# Patient Record
Sex: Female | Born: 1935 | Race: White | Hispanic: No | Smoking: Never smoker
Health system: Southern US, Community
[De-identification: ages and names within clinical notes are randomized; demographics above are authoritative.]

## PROBLEM LIST (undated history)

## (undated) DIAGNOSIS — F32A Depression, unspecified: Secondary | ICD-10-CM

## (undated) DIAGNOSIS — I10 Essential (primary) hypertension: Secondary | ICD-10-CM

## (undated) DIAGNOSIS — M810 Age-related osteoporosis without current pathological fracture: Secondary | ICD-10-CM

## (undated) DIAGNOSIS — E7800 Pure hypercholesterolemia, unspecified: Secondary | ICD-10-CM

## (undated) DIAGNOSIS — M1990 Unspecified osteoarthritis, unspecified site: Secondary | ICD-10-CM

## (undated) DIAGNOSIS — Z9109 Other allergy status, other than to drugs and biological substances: Secondary | ICD-10-CM

## (undated) DIAGNOSIS — N189 Chronic kidney disease, unspecified: Secondary | ICD-10-CM

## (undated) DIAGNOSIS — F329 Major depressive disorder, single episode, unspecified: Secondary | ICD-10-CM

## (undated) HISTORY — DX: Age-related osteoporosis without current pathological fracture: M81.0

## (undated) HISTORY — DX: Chronic kidney disease, unspecified: N18.9

## (undated) HISTORY — DX: Unspecified osteoarthritis, unspecified site: M19.90

## (undated) HISTORY — PX: ABDOMINAL HYSTERECTOMY: SHX81

---

## 2000-07-05 ENCOUNTER — Other Ambulatory Visit: Admission: RE | Admit: 2000-07-05 | Discharge: 2000-07-05 | Payer: Self-pay | Admitting: Gynecology

## 2003-06-19 ENCOUNTER — Other Ambulatory Visit: Admission: RE | Admit: 2003-06-19 | Discharge: 2003-06-19 | Payer: Self-pay | Admitting: Gynecology

## 2004-06-02 ENCOUNTER — Ambulatory Visit: Payer: Self-pay | Admitting: Family Medicine

## 2005-06-30 ENCOUNTER — Ambulatory Visit: Payer: Self-pay | Admitting: Family Medicine

## 2006-06-16 ENCOUNTER — Ambulatory Visit: Payer: Self-pay | Admitting: Ophthalmology

## 2006-06-22 ENCOUNTER — Ambulatory Visit: Payer: Self-pay | Admitting: Ophthalmology

## 2006-07-05 ENCOUNTER — Ambulatory Visit: Payer: Self-pay | Admitting: Family Medicine

## 2007-07-05 ENCOUNTER — Ambulatory Visit: Payer: Self-pay | Admitting: Family Medicine

## 2007-07-26 ENCOUNTER — Ambulatory Visit: Payer: Self-pay | Admitting: Gastroenterology

## 2007-07-26 LAB — HM COLONOSCOPY

## 2007-09-12 ENCOUNTER — Ambulatory Visit: Payer: Self-pay | Admitting: Family Medicine

## 2008-07-11 ENCOUNTER — Ambulatory Visit: Payer: Self-pay | Admitting: Family Medicine

## 2009-07-29 ENCOUNTER — Ambulatory Visit: Payer: Self-pay | Admitting: Family Medicine

## 2010-08-08 ENCOUNTER — Ambulatory Visit: Payer: Self-pay | Admitting: Family Medicine

## 2011-08-26 ENCOUNTER — Ambulatory Visit: Payer: Self-pay | Admitting: Family Medicine

## 2012-08-15 ENCOUNTER — Ambulatory Visit: Payer: Self-pay | Admitting: Family Medicine

## 2012-08-15 LAB — HM DEXA SCAN

## 2012-08-26 ENCOUNTER — Ambulatory Visit: Payer: Self-pay | Admitting: Family Medicine

## 2013-09-26 ENCOUNTER — Ambulatory Visit: Payer: Self-pay | Admitting: Family Medicine

## 2013-09-26 LAB — HM MAMMOGRAPHY

## 2014-05-03 ENCOUNTER — Ambulatory Visit: Payer: Self-pay | Admitting: Family Medicine

## 2014-09-11 ENCOUNTER — Other Ambulatory Visit: Payer: Self-pay | Admitting: Family Medicine

## 2014-09-11 DIAGNOSIS — Z1231 Encounter for screening mammogram for malignant neoplasm of breast: Secondary | ICD-10-CM

## 2014-09-11 LAB — CBC AND DIFFERENTIAL
HCT: 39 % (ref 36–46)
HEMOGLOBIN: 13.6 g/dL (ref 12.0–16.0)
Platelets: 242 10*3/uL (ref 150–399)
WBC: 5.4 10^3/mL

## 2014-09-11 LAB — LIPID PANEL
Cholesterol: 162 mg/dL (ref 0–200)
HDL: 50 mg/dL (ref 35–70)
LDL Cholesterol: 76 mg/dL
TRIGLYCERIDES: 178 mg/dL — AB (ref 40–160)

## 2014-09-11 LAB — TSH: TSH: 0.4 u[IU]/mL — AB (ref 0.41–5.90)

## 2014-09-11 LAB — BASIC METABOLIC PANEL
BUN: 21 mg/dL (ref 4–21)
CREATININE: 1.2 mg/dL — AB (ref 0.5–1.1)
Glucose: 88 mg/dL
POTASSIUM: 4.7 mmol/L (ref 3.4–5.3)
Sodium: 134 mmol/L — AB (ref 137–147)

## 2014-09-11 LAB — HEPATIC FUNCTION PANEL
ALT: 10 U/L (ref 7–35)
AST: 15 U/L (ref 13–35)

## 2014-10-03 ENCOUNTER — Ambulatory Visit
Admission: RE | Admit: 2014-10-03 | Discharge: 2014-10-03 | Disposition: A | Discharge status: Home / Self Care | Payer: PPO | Source: Ambulatory Visit | Attending: Family Medicine | Admitting: Family Medicine

## 2014-10-03 DIAGNOSIS — Z1231 Encounter for screening mammogram for malignant neoplasm of breast: Secondary | ICD-10-CM | POA: Diagnosis not present

## 2014-11-16 ENCOUNTER — Telehealth: Payer: Self-pay | Admitting: Family Medicine

## 2014-11-16 DIAGNOSIS — E0590 Thyrotoxicosis, unspecified without thyrotoxic crisis or storm: Secondary | ICD-10-CM

## 2014-11-16 NOTE — Telephone Encounter (Signed)
Thyroid was overactive last check.  It looks like she is due for TSH and T4.   Thanks,   -Mickel Baas

## 2014-11-16 NOTE — Telephone Encounter (Signed)
Pt informed, and will be by to pick up lab slip.

## 2014-11-16 NOTE — Telephone Encounter (Signed)
Pt wants to come pick up a lab order.  She said it is time to get them done.  Thanks [employer]

## 2014-11-16 NOTE — Telephone Encounter (Signed)
Printed.  Please notify patient. Thanks.  

## 2014-11-20 ENCOUNTER — Telehealth: Payer: Self-pay

## 2014-11-20 LAB — T4 AND TSH
T4, Total: 7.3 ug/dL (ref 4.5–12.0)
TSH: 0.666 u[IU]/mL (ref 0.450–4.500)

## 2014-11-20 NOTE — Telephone Encounter (Signed)
-----   Message from Margarita Rana, MD sent at 11/20/2014  6:38 AM EDT ----- Thyroid normal. Please notify patient. Thanks.

## 2014-11-20 NOTE — Telephone Encounter (Signed)
Pt advised as directed below.  Thanks

## 2014-11-26 ENCOUNTER — Encounter: Payer: Self-pay | Admitting: Podiatry

## 2014-11-26 ENCOUNTER — Ambulatory Visit (INDEPENDENT_AMBULATORY_CARE_PROVIDER_SITE_OTHER): Payer: PPO | Admitting: Podiatry

## 2014-11-26 VITALS — BP 148/73 | HR 68 | Resp 16

## 2014-11-26 DIAGNOSIS — M79673 Pain in unspecified foot: Secondary | ICD-10-CM | POA: Diagnosis not present

## 2014-11-26 DIAGNOSIS — L603 Nail dystrophy: Secondary | ICD-10-CM | POA: Diagnosis not present

## 2014-11-26 DIAGNOSIS — B351 Tinea unguium: Secondary | ICD-10-CM

## 2014-11-26 DIAGNOSIS — Q828 Other specified congenital malformations of skin: Secondary | ICD-10-CM

## 2014-11-26 DIAGNOSIS — M79606 Pain in leg, unspecified: Secondary | ICD-10-CM

## 2014-11-26 NOTE — Progress Notes (Signed)
   Subjective:    Patient ID: Becky Prince, female    DOB: 06/14/1925, 81 y.o.   MRN: [id number]  HPI  i need to have my toenails cut , my feet are a mess . I am here for the same thing i was here many years ago for .    Review of Systems     Objective:   Physical Exam: I have reviewed her chief complaint her past medical history medications allergy surgery social history review of systems. Pulses are palpable bilateral. Neurologic sensorium is intact per Semmes-Weinstein monofilament. Deep tendon reflexes are intact bilaterally muscle strength is normal bilateral. Orthopedic evaluation does demonstrate severe hallux valgus deformities with osteoarthritic changes and limitation in range of motion of the first metatarsophalangeal joint. She also has moderate to severe arthritic hammertoe deformities bilateral. These deformities have resulted and reactive hyperkeratoses and porokeratosis. She also has thick yellow dystrophic onychomycotic nails bilateral.        Assessment & Plan:  Assessment: Pain in limb secondary to onychomycosis and porokeratosis bilateral. Hallux valgus vomiting with hammertoe deformities bilateral.  Plan: Discussed etiology pathology conservative versus surgical therapies. Debrided all reactive hyperkeratoses and nails for her today I will follow up with her on an as-needed basis.

## 2015-01-07 ENCOUNTER — Telehealth: Payer: Self-pay | Admitting: Family Medicine

## 2015-01-07 MED ORDER — BENZONATATE 100 MG PO CAPS
100.0000 mg | ORAL_CAPSULE | Freq: Three times a day (TID) | ORAL | Status: DC | PRN
Start: 2015-01-07 — End: 2015-04-12

## 2015-01-07 NOTE — Telephone Encounter (Signed)
Advised patient as below.  

## 2015-01-07 NOTE — Telephone Encounter (Signed)
Pt called saying she was getting congestion and cough.  Would like to know if you would call something in for her.  [facility] uses [facility] [place]  Call back is [phone]   .  Thanks Con [employer]

## 2015-01-07 NOTE — Telephone Encounter (Signed)
Patient reports that her symptoms started yesterday morning. She has cough (with white phlegm), and congestion in chest. Patient denies any fever, wheezing, or shortness of breath. Patient reports that she does have PND. Denies any headache, itchy watery eyes, no ear pain or fullness. Patient's main complaint is her cough. Patient is requesting something to be called into the pharmacy. The pharmacy listed below is correct. Thanks!

## 2015-01-07 NOTE — Telephone Encounter (Signed)
Sent in rx for tessalon perls. Needs ov if worsens or does not improve. Thanks.

## 2015-01-15 ENCOUNTER — Telehealth: Payer: Self-pay | Admitting: Family Medicine

## 2015-01-15 MED ORDER — METOPROLOL TARTRATE 50 MG PO TABS: 100.0000 mg | ORAL_TABLET | Freq: Two times a day (BID) | ORAL | Status: DC

## 2015-01-15 NOTE — Telephone Encounter (Signed)
Pt stated that when she was getting her metoprolol (LOPRESSOR) 50 MG tablet through the mail order pharmacy she was getting a 90 day supply but when [facility] was [facility] she only got 180 tablets. Pt stated that she would like to be able to get it in 90 day supply again to [facility] [place]. Pt stated that [facility] and they are going to go ahead and give her 360 tablets. Pt would just like it corrected so that when she needs the refill in December she can get it for 90 days because it cost less than getting it every 3 months. Thanks TNP

## 2015-02-09 ENCOUNTER — Ambulatory Visit (INDEPENDENT_AMBULATORY_CARE_PROVIDER_SITE_OTHER): Payer: PPO

## 2015-02-09 DIAGNOSIS — Z23 Encounter for immunization: Secondary | ICD-10-CM

## 2015-04-11 ENCOUNTER — Telehealth: Payer: Self-pay | Admitting: Family Medicine

## 2015-04-11 NOTE — Telephone Encounter (Signed)
Pt states her balance does not seem right. Pt states she has fallen 3 times over the past month.  Pt would like to see you as soon as possible.  Can pt be worked in tomorrow?  CB#[phone]/MW

## 2015-04-11 NOTE — Telephone Encounter (Signed)
Ok to work in, but also can see mid-level today and they will consult me. Thanks.

## 2015-04-11 NOTE — Telephone Encounter (Signed)
Appointment scheduled for 04/12/2015@10 :45/MW

## 2015-04-12 ENCOUNTER — Ambulatory Visit (INDEPENDENT_AMBULATORY_CARE_PROVIDER_SITE_OTHER): Payer: PPO | Admitting: Family Medicine

## 2015-04-12 ENCOUNTER — Encounter: Payer: Self-pay | Admitting: Family Medicine

## 2015-04-12 VITALS — BP 118/78 | HR 53 | Temp 97.8°F | Resp 16 | Ht 68.0 in | Wt 141.0 lb

## 2015-04-12 DIAGNOSIS — F4329 Adjustment disorder with other symptoms: Secondary | ICD-10-CM

## 2015-04-12 DIAGNOSIS — F329 Major depressive disorder, single episode, unspecified: Secondary | ICD-10-CM | POA: Insufficient documentation

## 2015-04-12 DIAGNOSIS — I10 Essential (primary) hypertension: Secondary | ICD-10-CM | POA: Insufficient documentation

## 2015-04-12 DIAGNOSIS — E7800 Pure hypercholesterolemia, unspecified: Secondary | ICD-10-CM | POA: Insufficient documentation

## 2015-04-12 DIAGNOSIS — K5900 Constipation, unspecified: Secondary | ICD-10-CM | POA: Insufficient documentation

## 2015-04-12 DIAGNOSIS — M24549 Contracture, unspecified hand: Secondary | ICD-10-CM | POA: Insufficient documentation

## 2015-04-12 DIAGNOSIS — J302 Other seasonal allergic rhinitis: Secondary | ICD-10-CM | POA: Insufficient documentation

## 2015-04-12 DIAGNOSIS — E0590 Thyrotoxicosis, unspecified without thyrotoxic crisis or storm: Secondary | ICD-10-CM | POA: Diagnosis not present

## 2015-04-12 DIAGNOSIS — R5383 Other fatigue: Secondary | ICD-10-CM | POA: Insufficient documentation

## 2015-04-12 DIAGNOSIS — R197 Diarrhea, unspecified: Secondary | ICD-10-CM | POA: Insufficient documentation

## 2015-04-12 DIAGNOSIS — F339 Major depressive disorder, recurrent, unspecified: Secondary | ICD-10-CM

## 2015-04-12 DIAGNOSIS — M810 Age-related osteoporosis without current pathological fracture: Secondary | ICD-10-CM | POA: Insufficient documentation

## 2015-04-12 DIAGNOSIS — B354 Tinea corporis: Secondary | ICD-10-CM | POA: Insufficient documentation

## 2015-04-12 MED ORDER — METOPROLOL TARTRATE 50 MG PO TABS
50.0000 mg | ORAL_TABLET | Freq: Two times a day (BID) | ORAL | Status: DC
Start: 2015-04-12 — End: 2015-05-10

## 2015-04-12 NOTE — Patient Instructions (Signed)
Please call with blood pressure readings. Patient advised to try other the counter Tylenol instead of Tylenol PM.

## 2015-04-12 NOTE — Progress Notes (Signed)
Patient ID: Becky Prince, female   DOB: 1925/06/13, 81 y.o.   MRN: [id number]       Patient: Becky Prince Female    DOB: 15-Jun-1925   81 y.o.   MRN: [id number] Visit Date: 04/12/2015  Today's Provider: Margarita Rana, MD   Chief Complaint  Patient presents with  . Dizziness   Subjective:    Dizziness This is a recurrent ("off balance") problem. Episode onset: 6 months. The problem has been waxing and waning. Associated symptoms include fatigue, myalgias, vertigo, a visual change and weakness. Pertinent negatives include no abdominal pain, arthralgias, change in bowel habit, chills, congestion, coughing, fever, headaches, joint swelling, nausea, neck pain, numbness or vomiting. Associated symptoms comments: Patient reports she has had 3 falls. The symptoms are aggravated by standing, twisting and bending (especially when getting out of a car). She has tried nothing for the symptoms.       Allergies  Allergen Reactions  . Naproxen   . Oxycodone-Aspirin    Previous Medications   ASPIRIN 81 MG TABLET    Take by mouth.   BIOTIN PO    Take 1 tablet by mouth daily.   CITALOPRAM (CELEXA) 20 MG TABLET    Take by mouth.   DIPHENHYDRAMINE-APAP, SLEEP, (TYLENOL PM EXTRA STRENGTH PO)    Take 1 tablet by mouth daily.   FLUTICASONE (FLONASE) 50 MCG/ACT NASAL SPRAY    Place into the nose.   KETOCONAZOLE (NIZORAL) 2 % CREAM    KETOCONAZOLE, 2% (External Cream)  1 (one) cream cream apply to affected area once daily for 0 days  Quantity: 1;  Refills: 0   Ordered :03-May-2014  Ashley Royalty ;  Started 03-May-2014 Active   METOPROLOL (LOPRESSOR) 50 MG TABLET    Take 2 tablets (100 mg total) by mouth 2 (two) times daily.   MULTIPLE VITAMIN PO    Take 1 tablet by mouth daily.    OMEGA-3 FATTY ACIDS (FISH OIL) 1200 MG CAPS    Take 1 capsule by mouth daily.    SIMVASTATIN (ZOCOR) 20 MG TABLET    Take 20 mg by mouth daily at 6 PM.    TRIAMTERENE-HYDROCHLOROTHIAZIDE (MAXZIDE) 75-50 MG PER TABLET    Take  1 tablet by mouth daily.    VITAMIN E 200 UNITS TABS    Take 1 tablet by mouth daily.     Review of Systems  Constitutional: Positive for fatigue. Negative for fever and chills.  HENT: Negative for congestion.   Respiratory: Negative for cough.   Gastrointestinal: Negative for nausea, vomiting, abdominal pain and change in bowel habit.  Musculoskeletal: Positive for myalgias. Negative for joint swelling, arthralgias and neck pain.  Neurological: Positive for dizziness, vertigo and weakness. Negative for numbness and headaches.    Social History  Substance Use Topics  . Smoking status: Never Smoker   . Smokeless tobacco: Never Used  . Alcohol Use: No   Objective:   BP 118/78 mmHg  Pulse 53  Temp(Src) 97.8 F (36.6 C) (Oral)  Resp 16  Ht 5\' 8"  (1.727 m)  Wt 141 lb (63.957 kg)  BMI 21.44 kg/m2  SpO2 97%  Physical Exam  Constitutional: She appears well-developed and well-nourished.  HENT:  Right Ear: External ear normal.  Left Ear: External ear normal.  Cardiovascular: Normal rate and regular rhythm.   Pulmonary/Chest: Effort normal and breath sounds normal.        Assessment & Plan:     1. Essential hypertension Blood pressure low today  and low heart rate. Patient advised to D/C metoprolol 100 mg and start Metoprolol 50 mg BID. Patient advised to call with BP readings and advised to F/U in 1 month. - metoprolol (LOPRESSOR) 50 MG tablet; Take 1 tablet (50 mg total) by mouth 2 (two) times daily.  Dispense: 180 tablet; Refill: 3 - CBC with Differential/Platelet - Comprehensive metabolic panel  2. Subclinical hyperthyroidism - TSH  3. Adjustment disorder with other symptom Stable. F/U pending lab report.      Patient seen and examined by Dr. Jerrell Belfast, and note scribed by Philbert Riser. Dimas, CMA. I have reviewed the document for accuracy and completeness and I agree with above. - Jerrell Belfast, MD     Margarita Rana, MD  [facility]

## 2015-04-13 LAB — COMPREHENSIVE METABOLIC PANEL
A/G RATIO: 1.5 (ref 1.1–2.5)
ALK PHOS: 84 IU/L (ref 39–117)
ALT: 8 IU/L (ref 0–32)
AST: 14 IU/L (ref 0–40)
Albumin: 4.3 g/dL (ref 3.5–4.7)
BILIRUBIN TOTAL: 0.5 mg/dL (ref 0.0–1.2)
BUN/Creatinine Ratio: 20 (ref 11–26)
BUN: 28 mg/dL — ABNORMAL HIGH (ref 8–27)
CO2: 25 mmol/L (ref 18–29)
Calcium: 10 mg/dL (ref 8.7–10.3)
Chloride: 93 mmol/L — ABNORMAL LOW (ref 96–106)
Creatinine, Ser: 1.37 mg/dL — ABNORMAL HIGH (ref 0.57–1.00)
GFR calc Af Amer: 40 mL/min/{1.73_m2} — ABNORMAL LOW (ref >59)
GFR calc non Af Amer: 35 mL/min/{1.73_m2} — ABNORMAL LOW (ref >59)
GLOBULIN, TOTAL: 2.8 g/dL (ref 1.5–4.5)
Glucose: 88 mg/dL (ref 65–99)
POTASSIUM: 4.9 mmol/L (ref 3.5–5.2)
SODIUM: 135 mmol/L (ref 134–144)
Total Protein: 7.1 g/dL (ref 6.0–8.5)

## 2015-04-13 LAB — CBC WITH DIFFERENTIAL/PLATELET
BASOS: 0 %
Basophils Absolute: 0 10*3/uL (ref 0.0–0.2)
EOS (ABSOLUTE): 0.1 10*3/uL (ref 0.0–0.4)
EOS: 2 %
HEMATOCRIT: 40.5 % (ref 34.0–46.6)
Hemoglobin: 13.4 g/dL (ref 11.1–15.9)
IMMATURE GRANULOCYTES: 0 %
Immature Grans (Abs): 0 10*3/uL (ref 0.0–0.1)
LYMPHS: 35 %
Lymphocytes Absolute: 2.1 10*3/uL (ref 0.7–3.1)
MCH: 31.6 pg (ref 26.6–33.0)
MCHC: 33.1 g/dL (ref 31.5–35.7)
MCV: 96 fL (ref 79–97)
MONOS ABS: 0.6 10*3/uL (ref 0.1–0.9)
Monocytes: 10 %
NEUTROS PCT: 53 %
Neutrophils Absolute: 3.3 10*3/uL (ref 1.4–7.0)
Platelets: 252 10*3/uL (ref 150–379)
RBC: 4.24 x10E6/uL (ref 3.77–5.28)
RDW: 12.7 % (ref 12.3–15.4)
WBC: 6.2 10*3/uL (ref 3.4–10.8)

## 2015-04-13 LAB — TSH: TSH: 0.428 u[IU]/mL — AB (ref 0.450–4.500)

## 2015-04-15 ENCOUNTER — Telehealth: Payer: Self-pay

## 2015-04-15 NOTE — Telephone Encounter (Signed)
-----   Message from Margarita Rana, MD sent at 04/13/2015  8:13 AM EST ----- Labs about the same.  Recheck TSH and T4 in 4 weeks.  Thanks.

## 2015-04-15 NOTE — Telephone Encounter (Signed)
Pt advised as directed below.  She will call Mid January to get a lab sheet.   Thanks,   -Mickel Baas

## 2015-04-19 ENCOUNTER — Telehealth: Payer: Self-pay

## 2015-04-19 NOTE — Telephone Encounter (Signed)
Becky Prince called to give you her blood pressures for the week:  Blood Pressure  Pulse     123/69     64    125/70     63    120/62     64    118/62     60    118/69     61    116/54     70  Pt says her phones are down so if you need to get in contact with her please call [phone] or use e-mail.  Thanks,   -Mickel Baas

## 2015-04-24 ENCOUNTER — Telehealth: Payer: Self-pay | Admitting: Family Medicine

## 2015-04-24 DIAGNOSIS — E0590 Thyrotoxicosis, unspecified without thyrotoxic crisis or storm: Secondary | ICD-10-CM

## 2015-04-24 NOTE — Telephone Encounter (Signed)
Pt calling stating she would like to pick up her lab sheet to have labs done.  Thanks CC

## 2015-04-24 NOTE — Telephone Encounter (Signed)
Lab order printed and pt notified to pick up at front desk. sd

## 2015-04-27 LAB — T4 AND TSH
T4, Total: 7.5 ug/dL (ref 4.5–12.0)
TSH: 0.389 u[IU]/mL — ABNORMAL LOW (ref 0.450–4.500)

## 2015-04-30 ENCOUNTER — Telehealth: Payer: Self-pay

## 2015-04-30 NOTE — Telephone Encounter (Signed)
Advised pt of lab results. Pt verbally acknowledges understanding. Becky Prince, CMA   

## 2015-04-30 NOTE — Telephone Encounter (Signed)
-----   Message from Margarita Rana, MD sent at 04/27/2015  7:54 AM EST ----- Tyroid stable. Recheck thyroid profile with TSH in 3 months. Thanks.

## 2015-05-10 ENCOUNTER — Ambulatory Visit (INDEPENDENT_AMBULATORY_CARE_PROVIDER_SITE_OTHER): Payer: PPO | Admitting: Family Medicine

## 2015-05-10 ENCOUNTER — Encounter: Payer: Self-pay | Admitting: Family Medicine

## 2015-05-10 VITALS — BP 126/62 | HR 56 | Temp 97.8°F | Resp 16 | Wt 141.0 lb

## 2015-05-10 DIAGNOSIS — I10 Essential (primary) hypertension: Secondary | ICD-10-CM | POA: Diagnosis not present

## 2015-05-10 DIAGNOSIS — S76012A Strain of muscle, fascia and tendon of left hip, initial encounter: Secondary | ICD-10-CM | POA: Diagnosis not present

## 2015-05-10 MED ORDER — METOPROLOL TARTRATE 25 MG PO TABS: 25.0000 mg | ORAL_TABLET | Freq: Two times a day (BID) | ORAL | Status: DC

## 2015-05-10 NOTE — Patient Instructions (Addendum)
You can 2 Tylenol 3 times a day for hip pain. Use pad at bedtime for hip pain. Decrease Metoprolol to 1/2 (25 mg) tablet two times a day.

## 2015-05-10 NOTE — Progress Notes (Signed)
Subjective:    Patient ID: Becky Prince, female    DOB: 07/17/1925, 82 y.o.   MRN: [id number]  HPI Comments: Pt was last seen on 04/12/2015. BP and HR were both low; BP was 118/78 and HR was 53. PCP decreased Metoprolol 100 mg to 50 mg BID. Labs were checked and were stable.  Hypertension This is a chronic problem. The problem is controlled. Associated symptoms include blurred vision and malaise/fatigue. Pertinent negatives include no anxiety, chest pain, headaches, neck pain, orthopnea, palpitations, peripheral edema, shortness of breath or sweats. Risk factors for coronary artery disease include dyslipidemia, family history and post-menopausal state. Treatments tried: Metoprolol and Maxzide. The current treatment provides moderate improvement. There are no compliance problems.    Left Hip Pain Worsening. No known injury. Pain radiates down left leg. Exacerbated by a certain position (laying in bed the wrong way). Pt has tried Tylenol, ibuprofen, with some relief.   Review of Systems  Constitutional: Positive for malaise/fatigue.  Eyes: Positive for blurred vision.  Respiratory: Negative for shortness of breath.   Cardiovascular: Negative for chest pain, palpitations and orthopnea.       Bradycardia is present  Musculoskeletal: Positive for arthralgias (left hip pain). Negative for neck pain.  Neurological: Negative for headaches.   BP 126/62 mmHg  Pulse 56  Temp(Src) 97.8 F (36.6 C) (Oral)  Resp 16  Wt 141 lb (63.957 kg)   Patient Active Problem List   Diagnosis Date Noted  . Adaptation reaction 04/12/2015  . CN (constipation) 04/12/2015  . Contracture of finger joint 04/12/2015  . D (diarrhea) 04/12/2015  . Fatigue 04/12/2015  . Hypercholesteremia 04/12/2015  . BP (high blood pressure) 04/12/2015  . OP (osteoporosis) 04/12/2015  . Allergic rhinitis, seasonal 04/12/2015  . Body tinea 04/12/2015  . Subclinical hyperthyroidism 11/16/2014   No past medical history on  file. Current Outpatient Prescriptions on File Prior to Visit  Medication Sig  . aspirin 81 MG tablet Take by mouth.  . citalopram (CELEXA) 20 MG tablet Take by mouth.  . fluticasone (FLONASE) 50 MCG/ACT nasal spray Place into the nose.  . metoprolol (LOPRESSOR) 50 MG tablet Take 1 tablet (50 mg total) by mouth 2 (two) times daily.  . MULTIPLE VITAMIN PO Take 1 tablet by mouth daily.   . Omega-3 Fatty Acids (FISH OIL) 1200 MG CAPS Take 1 capsule by mouth daily.   . simvastatin (ZOCOR) 20 MG tablet Take 20 mg by mouth daily at 6 PM.   . triamterene-hydrochlorothiazide (MAXZIDE) 75-50 MG per tablet Take 1 tablet by mouth daily.   . Vitamin E 200 UNITS TABS Take 1 tablet by mouth daily.   Marland Kitchen BIOTIN PO Take 1 tablet by mouth daily. Reported on 05/10/2015  . ketoconazole (NIZORAL) 2 % cream Reported on 05/10/2015   No current facility-administered medications on file prior to visit.   Allergies  Allergen Reactions  . Naproxen   . Oxycodone-Aspirin    Past Surgical History  Procedure Laterality Date  . Abdominal hysterectomy     Social History   Social History  . Marital Status: Married    Spouse Name: N/A  . Number of Children: N/A  . Years of Education: N/A   Occupational History  . Not on file.   Social History Main Topics  . Smoking status: Never Smoker   . Smokeless tobacco: Never Used  . Alcohol Use: No  . Drug Use: No  . Sexual Activity: Not on file   Other Topics Concern  .  Not on file   Social History Narrative   Family History  Problem Relation Age of Onset  . Heart disease Mother     CHF  . Hypertension Mother   . Cancer Father     prostate  . Cancer Sister     breast  . Kidney disease Brother      .result     Objective:   Physical Exam  Constitutional: She appears well-developed and well-nourished.  Cardiovascular: Regular rhythm.  Bradycardia present.   Pulmonary/Chest: Effort normal and breath sounds normal.  Musculoskeletal:       Right hip:  She exhibits normal range of motion, normal strength, no tenderness and no crepitus.       Left hip: She exhibits normal range of motion, normal strength, no tenderness, no swelling and no crepitus.  Psychiatric: She has a normal mood and affect. Her behavior is normal.   BP 126/62 mmHg  Pulse 56  Temp(Src) 97.8 F (36.6 C) (Oral)  Resp 16  Wt 141 lb (63.957 kg)     Assessment & Plan:  1. Essential hypertension Stable. Will decrease Metoprolol to 25 mg po BID as below.  Recheck in 8 weeks.  - metoprolol tartrate (LOPRESSOR) 25 MG tablet; Take 1 tablet (25 mg total) by mouth 2 (two) times daily.  Dispense: 180 tablet; Refill: 3  2. Hip strain, left, initial encounter New problem. Advised pt to take 2 Tylenol 3 times a day for pain, as well as sleep with mat on her bed as she has done previously.    Patient seen and examined by Jerrell Belfast, MD, and note scribed by Renaldo Fiddler, CMA.  I have reviewed the document for accuracy and completeness and I agree with above. Jerrell Belfast, MD   Margarita Rana, MD

## 2015-06-03 DIAGNOSIS — H40003 Preglaucoma, unspecified, bilateral: Secondary | ICD-10-CM | POA: Diagnosis not present

## 2015-06-07 DIAGNOSIS — H2511 Age-related nuclear cataract, right eye: Secondary | ICD-10-CM | POA: Diagnosis not present

## 2015-06-10 ENCOUNTER — Encounter: Payer: Self-pay | Admitting: *Deleted

## 2015-06-12 ENCOUNTER — Ambulatory Visit: Payer: PPO | Admitting: Anesthesiology

## 2015-06-12 ENCOUNTER — Ambulatory Visit
Admission: RE | Admit: 2015-06-12 | Discharge: 2015-06-12 | Disposition: A | Discharge status: Home / Self Care | Payer: PPO | Source: Ambulatory Visit | Attending: Ophthalmology | Admitting: Ophthalmology

## 2015-06-12 ENCOUNTER — Encounter
Admission: RE | Disposition: A | Discharge status: Home / Self Care | Payer: Self-pay | Source: Ambulatory Visit | Attending: Ophthalmology

## 2015-06-12 DIAGNOSIS — F329 Major depressive disorder, single episode, unspecified: Secondary | ICD-10-CM | POA: Insufficient documentation

## 2015-06-12 DIAGNOSIS — Z886 Allergy status to analgesic agent status: Secondary | ICD-10-CM | POA: Diagnosis not present

## 2015-06-12 DIAGNOSIS — Z7982 Long term (current) use of aspirin: Secondary | ICD-10-CM | POA: Insufficient documentation

## 2015-06-12 DIAGNOSIS — Z9109 Other allergy status, other than to drugs and biological substances: Secondary | ICD-10-CM | POA: Insufficient documentation

## 2015-06-12 DIAGNOSIS — Z79899 Other long term (current) drug therapy: Secondary | ICD-10-CM | POA: Insufficient documentation

## 2015-06-12 DIAGNOSIS — I10 Essential (primary) hypertension: Secondary | ICD-10-CM | POA: Diagnosis not present

## 2015-06-12 DIAGNOSIS — E7800 Pure hypercholesterolemia, unspecified: Secondary | ICD-10-CM | POA: Insufficient documentation

## 2015-06-12 DIAGNOSIS — H269 Unspecified cataract: Secondary | ICD-10-CM | POA: Diagnosis not present

## 2015-06-12 DIAGNOSIS — Z7951 Long term (current) use of inhaled steroids: Secondary | ICD-10-CM | POA: Diagnosis not present

## 2015-06-12 DIAGNOSIS — H2511 Age-related nuclear cataract, right eye: Secondary | ICD-10-CM | POA: Diagnosis not present

## 2015-06-12 HISTORY — DX: Major depressive disorder, single episode, unspecified: F32.9

## 2015-06-12 HISTORY — DX: Depression, unspecified: F32.A

## 2015-06-12 HISTORY — PX: CATARACT EXTRACTION W/PHACO: SHX586

## 2015-06-12 HISTORY — DX: Pure hypercholesterolemia, unspecified: E78.00

## 2015-06-12 HISTORY — DX: Essential (primary) hypertension: I10

## 2015-06-12 HISTORY — DX: Other allergy status, other than to drugs and biological substances: Z91.09

## 2015-06-12 SURGERY — PHACOEMULSIFICATION, CATARACT, WITH IOL INSERTION
Anesthesia: Monitor Anesthesia Care | Site: Eye | Laterality: Right | Wound class: Clean

## 2015-06-12 MED ORDER — ARMC OPHTHALMIC DILATING GEL
1.0000 | OPHTHALMIC | Status: DC | PRN
Start: 2015-06-12 — End: 2015-06-12
  Administered 2015-06-12 (×2): 1 via OPHTHALMIC

## 2015-06-12 MED ORDER — FENTANYL CITRATE (PF) 100 MCG/2ML IJ SOLN
INTRAMUSCULAR | Status: DC | PRN
  Administered 2015-06-12: 50 ug via INTRAVENOUS

## 2015-06-12 MED ORDER — MIDAZOLAM HCL 2 MG/2ML IJ SOLN
INTRAMUSCULAR | Status: DC | PRN
  Administered 2015-06-12: 1 mg via INTRAVENOUS

## 2015-06-12 MED ORDER — NA HYALUR & NA CHOND-NA HYALUR 0.4-0.35 ML IO KIT
PACK | INTRAOCULAR | Status: DC | PRN
  Administered 2015-06-12: 1 mL via INTRAOCULAR

## 2015-06-12 MED ORDER — ACETAMINOPHEN 160 MG/5ML PO SOLN: 325.0000 mg | ORAL | Status: DC | PRN

## 2015-06-12 MED ORDER — CEFUROXIME OPHTHALMIC INJECTION 1 MG/0.1 ML
INJECTION | OPHTHALMIC | Status: DC | PRN
  Administered 2015-06-12: 0.1 mL via INTRACAMERAL

## 2015-06-12 MED ORDER — POVIDONE-IODINE 5 % OP SOLN
1.0000 "application " | OPHTHALMIC | Status: DC | PRN
  Administered 2015-06-12: 1 via OPHTHALMIC

## 2015-06-12 MED ORDER — LACTATED RINGERS IV SOLN: INTRAVENOUS | Status: DC

## 2015-06-12 MED ORDER — BRIMONIDINE TARTRATE 0.2 % OP SOLN
OPHTHALMIC | Status: DC | PRN
  Administered 2015-06-12: 1 [drp] via OPHTHALMIC

## 2015-06-12 MED ORDER — TIMOLOL MALEATE 0.5 % OP SOLN
OPHTHALMIC | Status: DC | PRN
  Administered 2015-06-12: 1 [drp] via OPHTHALMIC

## 2015-06-12 MED ORDER — EPINEPHRINE HCL 1 MG/ML IJ SOLN
INTRAMUSCULAR | Status: DC | PRN
  Administered 2015-06-12: 67 mL via OPHTHALMIC

## 2015-06-12 MED ORDER — ACETAMINOPHEN 325 MG PO TABS: 325.0000 mg | ORAL_TABLET | ORAL | Status: DC | PRN

## 2015-06-12 MED ORDER — TETRACAINE HCL 0.5 % OP SOLN
1.0000 [drp] | OPHTHALMIC | Status: DC | PRN
Start: 2015-06-12 — End: 2015-06-12
  Administered 2015-06-12: 1 [drp] via OPHTHALMIC

## 2015-06-12 SURGICAL SUPPLY — 27 items
CANNULA ANT/CHMB 27GA (MISCELLANEOUS) ×3 IMPLANT
CARTRIDGE ABBOTT (MISCELLANEOUS) ×3 IMPLANT
GLOVE SURG LX 7.5 STRW (GLOVE) ×2
GLOVE SURG LX STRL 7.5 STRW (GLOVE) ×1 IMPLANT
GLOVE SURG TRIUMPH 8.0 PF LTX (GLOVE) ×3 IMPLANT
GOWN STRL REUS W/ TWL LRG LVL3 (GOWN DISPOSABLE) ×2 IMPLANT
GOWN STRL REUS W/TWL LRG LVL3 (GOWN DISPOSABLE) ×4
LENS IOL ACRSF IQ PC 20.0 (Intraocular Lens) ×1 IMPLANT
LENS IOL ACRYSOF IQ POST 20.0 (Intraocular Lens) ×3 IMPLANT
MARKER SKIN DUAL TIP RULER LAB (MISCELLANEOUS) ×3 IMPLANT
NDL RETROBULBAR .5 NSTRL (NEEDLE) IMPLANT
NEEDLE FILTER BLUNT 18X 1/2SAF (NEEDLE) ×2
NEEDLE FILTER BLUNT 18X1 1/2 (NEEDLE) ×1 IMPLANT
PACK CATARACT BRASINGTON (MISCELLANEOUS) ×3 IMPLANT
PACK EYE AFTER SURG (MISCELLANEOUS) ×3 IMPLANT
PACK OPTHALMIC (MISCELLANEOUS) ×3 IMPLANT
RING MALYGIN 7.0 (MISCELLANEOUS) IMPLANT
SUT ETHILON 10-0 CS-B-6CS-B-6 (SUTURE)
SUT VICRYL  9 0 (SUTURE)
SUT VICRYL 9 0 (SUTURE) IMPLANT
SUTURE EHLN 10-0 CS-B-6CS-B-6 (SUTURE) IMPLANT
SYR 3ML LL SCALE MARK (SYRINGE) ×3 IMPLANT
SYR 5ML LL (SYRINGE) IMPLANT
SYR TB 1ML LUER SLIP (SYRINGE) ×3 IMPLANT
WATER STERILE IRR 250ML POUR (IV SOLUTION) ×3 IMPLANT
WATER STERILE IRR 500ML POUR (IV SOLUTION) IMPLANT
WIPE NON LINTING 3.25X3.25 (MISCELLANEOUS) ×3 IMPLANT

## 2015-06-12 NOTE — Transfer of Care (Signed)
Immediate Anesthesia Transfer of Care Note  Patient: Becky Prince  Procedure(s) Performed: Procedure(s): CATARACT EXTRACTION PHACO AND INTRAOCULAR LENS PLACEMENT (IOC) (Right)  Patient Location: PACU  Anesthesia Type: MAC  Level of Consciousness: awake, alert  and patient cooperative  Airway and Oxygen Therapy: Patient Spontanous Breathing and Patient connected to supplemental oxygen  Post-op Assessment: Post-op Vital signs reviewed, Patient's Cardiovascular Status Stable, Respiratory Function Stable, Patent Airway and No signs of Nausea or vomiting  Post-op Vital Signs: Reviewed and stable  Complications: No apparent anesthesia complications

## 2015-06-12 NOTE — Anesthesia Preprocedure Evaluation (Signed)
Anesthesia Evaluation  Patient identified by MRN, date of birth, ID band  Reviewed: Allergy & Precautions, H&P , NPO status , Patient's Chart, lab work & pertinent test results  Airway Mallampati: II  TM Distance: >3 FB Neck ROM: full    Dental no notable dental hx.    Pulmonary    Pulmonary exam normal        Cardiovascular hypertension,  Rhythm:regular Rate:Normal     Neuro/Psych PSYCHIATRIC DISORDERS    GI/Hepatic   Endo/Other  Hyperthyroidism   Renal/GU      Musculoskeletal   Abdominal   Peds  Hematology   Anesthesia Other Findings   Reproductive/Obstetrics                             Anesthesia Physical Anesthesia Plan  ASA: II  Anesthesia Plan: MAC   Post-op Pain Management:    Induction:   Airway Management Planned:   Additional Equipment:   Intra-op Plan:   Post-operative Plan:   Informed Consent: I have reviewed the patients History and Physical, chart, labs and discussed the procedure including the risks, benefits and alternatives for the proposed anesthesia with the patient or authorized representative who has indicated his/her understanding and acceptance.     Plan Discussed with: CRNA  Anesthesia Plan Comments:         Anesthesia Quick Evaluation

## 2015-06-12 NOTE — Discharge Instructions (Signed)

## 2015-06-12 NOTE — Anesthesia Postprocedure Evaluation (Signed)
Anesthesia Post Note  Patient: Becky Prince  Procedure(s) Performed: Procedure(s) (LRB): CATARACT EXTRACTION PHACO AND INTRAOCULAR LENS PLACEMENT (IOC) (Right)  Patient location during evaluation: PACU Anesthesia Type: MAC Level of consciousness: awake and alert and oriented Pain management: satisfactory to patient Vital Signs Assessment: post-procedure vital signs reviewed and stable Respiratory status: spontaneous breathing, nonlabored ventilation and respiratory function stable Cardiovascular status: blood pressure returned to baseline and stable Postop Assessment: Adequate PO intake and No signs of nausea or vomiting Anesthetic complications: no    Raliegh Ip

## 2015-06-12 NOTE — H&P (Signed)
  The History and Physical notes are on paper, have been signed, and are to be scanned. The patient remains stable and unchanged from the H&P.   Previous H&P reviewed, patient examined, and there are no changes.  Becky Prince 06/12/2015 10:27 AM

## 2015-06-12 NOTE — Op Note (Signed)
LOCATION:  [place]   PREOPERATIVE DIAGNOSIS:    Nuclear sclerotic cataract right eye. H25.11   POSTOPERATIVE DIAGNOSIS:  Nuclear sclerotic cataract right eye.     PROCEDURE:  Phacoemusification with posterior chamber intraocular lens placement of the right eye   LENS:   Implant Name Type Inv. Item Serial No. Manufacturer Lot No. LRB No. Used  IMPLANT LENS - [id number] Intraocular Lens IMPLANT LENS [id number] ALCON   Right 1     [id number] 20.0 D PCiol   ULTRASOUND TIME: 17 % of 0 minutes, 57 seconds.  CDE 9.8   SURGEON:  Wyonia Hough, MD   ANESTHESIA:  Topical with tetracaine drops and 2% Xylocaine jelly.   COMPLICATIONS:  None.   DESCRIPTION OF PROCEDURE:  The patient was identified in the holding room and transported to the operating room and placed in the supine position under the operating microscope.  The right eye was identified as the operative eye and it was prepped and draped in the usual sterile ophthalmic fashion.   A 1 millimeter clear-corneal paracentesis was made at the 12:00 position.  The anterior chamber was filled with Viscoat viscoelastic.  A 2.4 millimeter keratome was used to make a near-clear corneal incision at the 9:00 position.  A curvilinear capsulorrhexis was made with a cystotome and capsulorrhexis forceps.  Balanced salt solution was used to hydrodissect and hydrodelineate the nucleus.   Phacoemulsification was then used in stop and chop fashion to remove the lens nucleus and epinucleus.  The remaining cortex was then removed using the irrigation and aspiration handpiece. Provisc was then placed into the capsular bag to distend it for lens placement.  A lens was then injected into the capsular bag.  The remaining viscoelastic was aspirated.   Wounds were hydrated with balanced salt solution.  The anterior chamber was inflated to a physiologic pressure with balanced salt solution.  No wound leaks were noted. Cefuroxime 0.1 ml of a 10mg /ml  solution was injected into the anterior chamber for a dose of 1 mg of intracameral antibiotic at the completion of the case.   Timolol and Brimonidine drops were applied to the eye.  The patient was taken to the recovery room in stable condition without complications of anesthesia or surgery.   Becky Prince 06/12/2015, 11:21 AM

## 2015-06-12 NOTE — Anesthesia Procedure Notes (Signed)
Procedure Name: MAC Performed by: Lynann Demetrius Pre-anesthesia Checklist: Patient identified, Emergency Drugs available, Suction available, Timeout performed and Patient being monitored Patient Re-evaluated:Patient Re-evaluated prior to inductionOxygen Delivery Method: Nasal cannula Placement Confirmation: positive ETCO2     

## 2015-06-13 ENCOUNTER — Encounter: Payer: Self-pay | Admitting: Ophthalmology

## 2015-07-09 ENCOUNTER — Other Ambulatory Visit: Payer: Self-pay | Admitting: Family Medicine

## 2015-07-09 ENCOUNTER — Encounter: Payer: Self-pay | Admitting: Family Medicine

## 2015-07-09 ENCOUNTER — Ambulatory Visit (INDEPENDENT_AMBULATORY_CARE_PROVIDER_SITE_OTHER): Payer: PPO | Admitting: Family Medicine

## 2015-07-09 VITALS — BP 118/66 | HR 60 | Temp 97.9°F | Resp 16 | Wt 141.0 lb

## 2015-07-09 DIAGNOSIS — F4329 Adjustment disorder with other symptoms: Secondary | ICD-10-CM

## 2015-07-09 DIAGNOSIS — J302 Other seasonal allergic rhinitis: Secondary | ICD-10-CM

## 2015-07-09 DIAGNOSIS — E7800 Pure hypercholesterolemia, unspecified: Secondary | ICD-10-CM

## 2015-07-09 DIAGNOSIS — I10 Essential (primary) hypertension: Secondary | ICD-10-CM

## 2015-07-09 MED ORDER — METOPROLOL TARTRATE 25 MG PO TABS: 12.5000 mg | ORAL_TABLET | Freq: Two times a day (BID) | ORAL | Status: DC

## 2015-07-09 NOTE — Progress Notes (Signed)
Patient ID: Becky Prince, female   DOB: [birth year], 82 y.o.   MRN: [id number]         Patient: Becky Prince Female    DOB: 03-Oct-1925   82 y.o.   MRN: [id number] Visit Date: 07/09/2015  Today's Provider: Margarita Rana, MD   Chief Complaint  Patient presents with  . Hypertension   Subjective:    HPI Comments: Pt reports having trouble with occasional headaches and lightheadedness.  She says it is usually worse in the mornings when she gets out of bed.  She is not sure if it is related to her eye drops and recent cataract surgery; but her symptoms did start around the same time. [facility] February)  She does have a follow up with her eye doctor scheduled this week.   Hypertension This is a chronic (Pt was seen two months ago and reduced Metoprolol to 25mg  twice a day.) problem. The problem is controlled (Blood pressure at home is 110-120's/60-70's.). Associated symptoms include headaches and malaise/fatigue. Pertinent negatives include no anxiety, blurred vision, chest pain, neck pain, orthopnea, palpitations, peripheral edema, PND, shortness of breath or sweats. There are no compliance problems.        Allergies  Allergen Reactions  . Naproxen Swelling    Had lip swelling 20 yrs ago - not sure if caused by oxycodone or naproxen  . Oxycodone-Aspirin Swelling    Had lip swelling 20 yrs ago. Not sure if caused by oxycodone or naproxen.   Previous Medications   ASPIRIN 81 MG TABLET    Take by mouth.   BIOTIN PO    Take 1 tablet by mouth daily. Reported on 05/10/2015   CITALOPRAM (CELEXA) 20 MG TABLET    Take by mouth.   FLUTICASONE (FLONASE) 50 MCG/ACT NASAL SPRAY    Place into the nose.   KETOCONAZOLE (NIZORAL) 2 % CREAM    Reported on 05/10/2015   METOPROLOL TARTRATE (LOPRESSOR) 25 MG TABLET    Take 1 tablet (25 mg total) by mouth 2 (two) times daily.   MULTIPLE VITAMIN PO    Take 1 tablet by mouth daily.    OMEGA-3 FATTY ACIDS (FISH OIL) 1200 MG CAPS    Take 1 capsule by mouth daily.     SIMVASTATIN (ZOCOR) 20 MG TABLET    Take 20 mg by mouth daily at 6 PM.    TRIAMTERENE-HYDROCHLOROTHIAZIDE (MAXZIDE) 75-50 MG PER TABLET    Take 1 tablet by mouth daily.    VITAMIN E 200 UNITS TABS    Take 1 tablet by mouth daily.     Review of Systems  Constitutional: Positive for malaise/fatigue and fatigue. Negative for fever, chills, diaphoresis, activity change, appetite change and unexpected weight change.  Eyes: Negative for blurred vision.  Respiratory: Negative.  Negative for shortness of breath.   Cardiovascular: Negative.  Negative for chest pain, palpitations, orthopnea and PND.  Gastrointestinal: Positive for constipation (Occasional Constipation). Negative for nausea, vomiting, diarrhea, blood in stool, abdominal distention, anal bleeding and rectal pain.  Musculoskeletal: Negative for myalgias, arthralgias, gait problem and neck pain.  Neurological: Positive for dizziness, light-headedness and headaches. Negative for tremors, weakness and numbness.    Social History  Substance Use Topics  . Smoking status: Never Smoker   . Smokeless tobacco: Never Used  . Alcohol Use: No   Objective:   BP 118/66 mmHg  Pulse 60  Temp(Src) 97.9 F (36.6 C) (Oral)  Resp 16  Wt 141 lb (63.957 kg)  Physical Exam  Constitutional: She is oriented to person, place, and time. She appears well-developed and well-nourished.  Cardiovascular: Normal rate, regular rhythm and normal heart sounds.   Pulmonary/Chest: Effort normal and breath sounds normal.  Neurological: She is alert and oriented to person, place, and time.  Psychiatric: She has a normal mood and affect. Her behavior is normal. Judgment and thought content normal.      Assessment & Plan:     1. Essential hypertension Blood pressure is still running low; which could be the reason why she is dizzy and fatigued.  Will reduce Metoprolol to 12.5mg  twice a day; advised pt to call in about a month with her blood pressure readings.  (May need to stop metoprolol all together.)  Recheck at her physical in May.  - metoprolol tartrate (LOPRESSOR) 25 MG tablet; Take 0.5 tablets (12.5 mg total) by mouth 2 (two) times daily.  Dispense: 90 tablet; Refill: 1     Patient was seen and examined by Jerrell Belfast, MD, and note scribed by Ashley Royalty, CMA.  I have reviewed the document for accuracy and completeness and I agree with above. - Jerrell Belfast, MD   Margarita Rana, MD  [facility]

## 2015-08-06 ENCOUNTER — Other Ambulatory Visit: Payer: Self-pay | Admitting: Family Medicine

## 2015-08-06 DIAGNOSIS — B354 Tinea corporis: Secondary | ICD-10-CM

## 2015-08-08 ENCOUNTER — Telehealth: Payer: Self-pay | Admitting: Family Medicine

## 2015-08-08 NOTE — Telephone Encounter (Signed)
Patient called office today with complaints of achy pain in her right leg at her knee radiating to her waist. Patient states that she is unable to bear weight and is now having to use cane to get around. I offered patient an appt this afternoon with Tawanna Sat but she has declined and wanted to speak with Dr. Cathie Hoops nurse to see if anything can be done. KW

## 2015-08-08 NOTE — Telephone Encounter (Signed)
Made apt for 10am tomorrow.  (Pt did not want to see Tawanna Sat this afternoon.)   Thanks,   -Mickel Baas

## 2015-08-09 ENCOUNTER — Ambulatory Visit (INDEPENDENT_AMBULATORY_CARE_PROVIDER_SITE_OTHER): Payer: PPO | Admitting: Family Medicine

## 2015-08-09 ENCOUNTER — Encounter: Payer: Self-pay | Admitting: Family Medicine

## 2015-08-09 VITALS — BP 138/68 | HR 60 | Temp 97.9°F | Resp 16 | Wt 141.0 lb

## 2015-08-09 DIAGNOSIS — M7071 Other bursitis of hip, right hip: Secondary | ICD-10-CM | POA: Diagnosis not present

## 2015-08-09 DIAGNOSIS — M79604 Pain in right leg: Secondary | ICD-10-CM | POA: Diagnosis not present

## 2015-08-09 MED ORDER — MELOXICAM 7.5 MG PO TABS: 7.5000 mg | ORAL_TABLET | Freq: Two times a day (BID) | ORAL | Status: DC

## 2015-08-09 NOTE — Progress Notes (Signed)
Patient ID: Becky Prince, female   DOB: 28-Feb-1926, 82 y.o.   MRN: [id number]        Patient: Becky Prince Female    DOB: Jan 07, 1926   82 y.o.   MRN: [id number] Visit Date: 08/09/2015  Today's Provider: Margarita Rana, MD   Chief Complaint  Patient presents with  . Leg Pain   Subjective:    Leg Pain  The incident occurred more than 1 week ago. The incident occurred at home. There was no injury mechanism. The pain is present in the right leg. The quality of the pain is described as aching. The pain has been fluctuating since onset. Associated symptoms include an inability to bear weight. The symptoms are aggravated by movement (Pt reports the pain is worse when she first stands to start walking. ). She has tried heat, acetaminophen and rest for the symptoms. The treatment provided mild relief.       Allergies  Allergen Reactions  . Naproxen Swelling    Had lip swelling 20 yrs ago - not sure if caused by oxycodone or naproxen  . Oxycodone-Aspirin Swelling    Had lip swelling 20 yrs ago. Not sure if caused by oxycodone or naproxen.   Previous Medications   ASPIRIN 81 MG TABLET    Take by mouth.   BIOTIN PO    Take 1 tablet by mouth daily. Reported on 05/10/2015   CITALOPRAM (CELEXA) 20 MG TABLET    1 TABLET TABLET, ORAL, DAILY   FLUTICASONE (FLONASE) 50 MCG/ACT NASAL SPRAY    INSTILL 2 SPRAYS INTO EACH NOSTRIL DAILY   METOPROLOL TARTRATE (LOPRESSOR) 25 MG TABLET    Take 0.5 tablets (12.5 mg total) by mouth 2 (two) times daily.   MULTIPLE VITAMIN PO    Take 1 tablet by mouth daily.    OMEGA-3 FATTY ACIDS (FISH OIL) 1200 MG CAPS    Take 1 capsule by mouth daily.    SIMVASTATIN (ZOCOR) 20 MG TABLET    1 (ONE) TABLET TABLET, ORAL, EVERY EVENING   TRIAMTERENE-HYDROCHLOROTHIAZIDE (MAXZIDE) 75-50 MG TABLET    1 TABLET TABLET, ORAL, DAILY   VITAMIN E 200 UNITS TABS    Take 1 tablet by mouth daily.     Review of Systems  Constitutional: Negative.   Musculoskeletal: Positive for  myalgias, arthralgias and gait problem. Negative for back pain, joint swelling, neck pain and neck stiffness.  Neurological: Positive for headaches (Pt reports having more headaches since her leg pain has started. ). Negative for dizziness and light-headedness.    Social History  Substance Use Topics  . Smoking status: Never Smoker   . Smokeless tobacco: Never Used  . Alcohol Use: No   Objective:   BP 138/68 mmHg  Pulse 60  Temp(Src) 97.9 F (36.6 C) (Oral)  Resp 16  Wt 141 lb (63.957 kg)  Physical Exam  Constitutional: She is oriented to person, place, and time. She appears well-developed and well-nourished.  Musculoskeletal:       Right hip: Normal.  No pain with internal and external rotation.  Right hip stable; and right knee joint stable.    Neurological: She is alert and oriented to person, place, and time.  Skin: Skin is warm and dry.  Psychiatric: She has a normal mood and affect. Her behavior is normal. Judgment and thought content normal.      Assessment & Plan:     1. Right leg pain Worsening. Will treat with anti-inflammatories and ice. Patient instructed to call  back if condition worsens or does not improve.   Will refer to Dr. [name].  2. Hip bursitis, right New problem. Will treat as below and refer as needed.   Patient instructed to call back if condition worsens or does not improve and will refer as above.  - meloxicam (MOBIC) 7.5 MG tablet; Take 1 tablet (7.5 mg total) by mouth 2 (two) times daily.  Dispense: 60 tablet; Refill: 0   Patient was seen and examined by Jerrell Belfast, MD, and note scribed by Ashley Royalty, CMA.  I have reviewed the document for accuracy and completeness and I agree with above. - Jerrell Belfast, MD     Margarita Rana, MD  [facility]

## 2015-08-16 ENCOUNTER — Telehealth: Payer: Self-pay | Admitting: Emergency Medicine

## 2015-08-16 NOTE — Telephone Encounter (Signed)
Pt called to let you know that her leg is doing a lot better, probably 75% better. She has been icing it and taking her medications that she was prescribed. She will continue to do what she has been doing.

## 2015-09-04 ENCOUNTER — Other Ambulatory Visit: Payer: Self-pay | Admitting: Family Medicine

## 2015-09-11 ENCOUNTER — Encounter: Payer: Self-pay | Admitting: Family Medicine

## 2015-09-11 ENCOUNTER — Ambulatory Visit (INDEPENDENT_AMBULATORY_CARE_PROVIDER_SITE_OTHER): Payer: PPO | Admitting: Family Medicine

## 2015-09-11 VITALS — BP 116/62 | HR 64 | Temp 98.0°F | Resp 16 | Ht 67.0 in | Wt 139.0 lb

## 2015-09-11 DIAGNOSIS — E0590 Thyrotoxicosis, unspecified without thyrotoxic crisis or storm: Secondary | ICD-10-CM | POA: Diagnosis not present

## 2015-09-11 DIAGNOSIS — Z1231 Encounter for screening mammogram for malignant neoplasm of breast: Secondary | ICD-10-CM | POA: Diagnosis not present

## 2015-09-11 DIAGNOSIS — I10 Essential (primary) hypertension: Secondary | ICD-10-CM | POA: Diagnosis not present

## 2015-09-11 DIAGNOSIS — M810 Age-related osteoporosis without current pathological fracture: Secondary | ICD-10-CM

## 2015-09-11 DIAGNOSIS — E7800 Pure hypercholesterolemia, unspecified: Secondary | ICD-10-CM | POA: Diagnosis not present

## 2015-09-11 DIAGNOSIS — M79604 Pain in right leg: Secondary | ICD-10-CM

## 2015-09-11 DIAGNOSIS — Z0000 Encounter for general adult medical examination without abnormal findings: Secondary | ICD-10-CM

## 2015-09-11 MED ORDER — TRIAMTERENE-HCTZ 37.5-25 MG PO TABS: 1.0000 | ORAL_TABLET | Freq: Every day | ORAL | Status: DC

## 2015-09-11 NOTE — Patient Instructions (Signed)
Please call the [place] at [facility] to schedule this at [phone]   Please call [facility] [phone] and ask for [facility].

## 2015-09-11 NOTE — Progress Notes (Signed)
Patient ID: [name], female   DOB: [birth year], 82 y.o.   MRN: [id number]       Patient: Becky Prince, Female    DOB: 1926-02-26, 82 y.o.   MRN: [id number] Visit Date: 09/11/2015  Today's Provider: Margarita Rana, MD   Chief Complaint  Patient presents with  . Medicare Wellness   Subjective:    Annual wellness visit Becky Prince is a 82 y.o. female. She feels well. She reports exercising active with daily activites. She reports she is sleeping well.  08/29/14 AWE 10/03/14 mammogram-BI-RADS 1 08/15/12 BMD-hip and back normal, wrist-osteoporsis 07/26/07 Colonoscopy-WNL, Dr. Allen Norris -----------------------------------------------------------  Hypertension, follow-up:  BP Readings from Last 3 Encounters:  09/11/15 116/62  08/09/15 138/68  07/09/15 118/66    She was last seen for hypertension 1 months ago.  BP at that visit was 138/68. Management changes since that visit include no changes. She reports excellent compliance with treatment. She is not having side effects. She is not exercising. She is adherent to low salt diet.   Outside blood pressures are stable. She is experiencing none.  Patient denies chest pain.   Cardiovascular risk factors include advanced age (older than 9 for men, 55 for women).  Use of agents associated with hypertension: none.     Weight trend: stable Wt Readings from Last 3 Encounters:  09/11/15 139 lb (63.05 kg)  08/09/15 141 lb (63.957 kg)  07/09/15 141 lb (63.957 kg)    Current diet: in general, a "healthy" diet    ------------------------------------------------------------------------    Review of Systems  Constitutional: Negative.   HENT: Negative.   Eyes: Negative.   Respiratory: Negative.   Cardiovascular: Negative.   Gastrointestinal: Negative.   Endocrine: Negative.   Genitourinary: Negative.   Musculoskeletal: Positive for myalgias.  Skin: Negative.   Allergic/Immunologic: Negative.   Neurological: Negative.     Hematological: Negative.   Psychiatric/Behavioral: Negative.     Social History   Social History  . Marital Status: Married    Spouse Name: N/A  . Number of Children: N/A  . Years of Education: N/A   Occupational History  . Not on file.   Social History Main Topics  . Smoking status: Never Smoker   . Smokeless tobacco: Never Used  . Alcohol Use: No  . Drug Use: No  . Sexual Activity: Not on file   Other Topics Concern  . Not on file   Social History Narrative    Past Medical History  Diagnosis Date  . Hypertension   . Hypercholesteremia   . Depression   . Environmental allergies      Patient Active Problem List   Diagnosis Date Noted  . Adaptation reaction 04/12/2015  . CN (constipation) 04/12/2015  . Contracture of finger joint 04/12/2015  . D (diarrhea) 04/12/2015  . Fatigue 04/12/2015  . Hypercholesteremia 04/12/2015  . BP (high blood pressure) 04/12/2015  . OP (osteoporosis) 04/12/2015  . Allergic rhinitis, seasonal 04/12/2015  . Body tinea 04/12/2015  . Subclinical hyperthyroidism 11/16/2014    Past Surgical History  Procedure Laterality Date  . Abdominal hysterectomy    . Cataract extraction w/phaco Right 06/12/2015    Procedure: CATARACT EXTRACTION PHACO AND INTRAOCULAR LENS PLACEMENT (IOC);  Surgeon: Leandrew Koyanagi, MD;  Location: [place];  Service: Ophthalmology;  Laterality: Right;    Her family history includes Cancer in her father and sister; Heart disease in her mother; Hypertension in her mother; Kidney disease in her brother.    Previous Medications  ASPIRIN 81 MG TABLET    Take 81 mg by mouth daily.    BIOTIN PO    Take 1 tablet by mouth daily. Reported on 05/10/2015   CITALOPRAM (CELEXA) 20 MG TABLET    1 TABLET TABLET, ORAL, DAILY   FLUTICASONE (FLONASE) 50 MCG/ACT NASAL SPRAY    INSTILL 2 SPRAYS INTO EACH NOSTRIL DAILY   METOPROLOL TARTRATE (LOPRESSOR) 25 MG TABLET    Take 0.5 tablets (12.5 mg total) by mouth 2  (two) times daily.   MULTIPLE VITAMIN PO    Take 1 tablet by mouth daily.    OMEGA-3 FATTY ACIDS (FISH OIL) 1200 MG CAPS    Take 1 capsule by mouth daily.    SIMVASTATIN (ZOCOR) 20 MG TABLET    1 (ONE) TABLET TABLET, ORAL, EVERY EVENING   TRIAMTERENE-HYDROCHLOROTHIAZIDE (MAXZIDE) 75-50 MG TABLET    1 TABLET TABLET, ORAL, DAILY   VITAMIN E 200 UNITS TABS    Take 1 tablet by mouth daily.     Patient Care Team: Margarita Rana, MD as PCP - General (Family Medicine)     Objective:   Vitals: BP 116/62 mmHg  Pulse 64  Temp(Src) 98 F (36.7 C) (Oral)  Resp 16  Ht 5\' 7"  (1.702 m)  Wt 139 lb (63.05 kg)  BMI 21.77 kg/m2  Physical Exam  Constitutional: She is oriented to person, place, and time. She appears well-developed and well-nourished.  HENT:  Head: Normocephalic and atraumatic.  Right Ear: Tympanic membrane, external ear and ear canal normal.  Left Ear: Tympanic membrane, external ear and ear canal normal.  Nose: Nose normal.  Mouth/Throat: Uvula is midline, oropharynx is clear and moist and mucous membranes are normal.  Eyes: Conjunctivae, EOM and lids are normal. Pupils are equal, round, and reactive to light.  Neck: Trachea normal and normal range of motion. Neck supple. Carotid bruit is not present. No thyroid mass and no thyromegaly present.  Cardiovascular: Normal rate, regular rhythm and normal heart sounds.   Pulmonary/Chest: Effort normal and breath sounds normal.  Abdominal: Soft. Normal appearance and bowel sounds are normal. There is no hepatosplenomegaly. There is no tenderness.  Genitourinary: No breast swelling, tenderness or discharge.  Musculoskeletal: Normal range of motion.  Lymphadenopathy:    She has no cervical adenopathy.    She has no axillary adenopathy.  Neurological: She is alert and oriented to person, place, and time. She has normal strength. No cranial nerve deficit.  Skin: Skin is warm, dry and intact.  Psychiatric: She has a normal mood and affect.  Her speech is normal and behavior is normal. Judgment and thought content normal. Cognition and memory are normal.    Activities of Daily Living In your present state of health, do you have any difficulty performing the following activities: 09/11/2015  Hearing? N  Vision? N  Difficulty concentrating or making decisions? Y  Walking or climbing stairs? N  Dressing or bathing? N  Doing errands, shopping? N    Fall Risk Assessment Fall Risk  09/11/2015  Falls in the past year? No     Depression Screen PHQ 2/9 Scores 09/11/2015  PHQ - 2 Score 0    Cognitive Testing - 6-CIT  Correct? Score   What year is it? yes 0 0 or 4  What month is it? yes 0 0 or 3  Memorize:    Pia Mau,  44,  [place],      What time is it? (within 1 hour) yes  0 0 or 3  Count backwards from 20 yes 0 0, 2, or 4  Name the months of the year yes 0 0, 2, or 4  Repeat name & address above no 4 0, 2, 4, 6, 8, or 10       TOTAL SCORE  4/28   Interpretation:  Normal  Normal (0-7) Abnormal (8-28)       Assessment & Plan:     Annual Wellness Visit  Reviewed patient's Family Medical History Reviewed and updated list of patient's medical providers Assessment of cognitive impairment was done Assessed patient's functional ability Established a written schedule for health screening [place] Completed and Reviewed  Exercise Activities and Dietary recommendations Goals    None      Immunization History  Administered Date(s) Administered  . Influenza, High Dose Seasonal PF 02/09/2015  . Pneumococcal Conjugate-13 08/29/2014  . Td 03/26/2003, 01/10/2011  . Tdap 01/10/2011  . Zoster 10/14/2006       1. Medicare annual wellness visit, subsequent Stable. Patient advised to continue eating healthy and exercise daily.  2. Essential hypertension Stable. Patient started on triamterene-HCTZ 37.5-25 mg as below. Patient advised to D/C Maxzide 75-50 mg. Patient to  follow-up in the summer with [facility].  . - CBC with Differential/Platelet - Comprehensive metabolic panel - triamterene-hydrochlorothiazide (MAXZIDE-25) 37.5-25 MG tablet; Take 1 tablet by mouth daily.  Dispense: 90 tablet; Refill: 3  3. Right leg pain - Ambulatory referral to Occupational Therapy - AMB referral to orthopedics  4. Subclinical hyperthyroidism - T4 AND TSH  5. OP (osteoporosis) - DG Bone Density; Future  6. Hypercholesteremia - Lipid Panel With LDL/HDL Ratio  7. Encounter for screening mammogram for breast cancer - MM DIGITAL SCREENING BILATERAL; Future     Patient seen and examined by Dr. Jerrell Belfast, and note scribed by Philbert Riser. Dimas, CMA.  I have reviewed the document for accuracy and completeness and I agree with above. Jerrell Belfast, MD   Margarita Rana, MD   ------------------------------------------------------------------------------------------------------------

## 2015-09-16 DIAGNOSIS — M5416 Radiculopathy, lumbar region: Secondary | ICD-10-CM | POA: Diagnosis not present

## 2015-09-16 DIAGNOSIS — M5136 Other intervertebral disc degeneration, lumbar region: Secondary | ICD-10-CM | POA: Diagnosis not present

## 2015-09-16 DIAGNOSIS — M7061 Trochanteric bursitis, right hip: Secondary | ICD-10-CM | POA: Diagnosis not present

## 2015-09-17 ENCOUNTER — Telehealth: Payer: Self-pay

## 2015-09-17 DIAGNOSIS — M79604 Pain in right leg: Secondary | ICD-10-CM

## 2015-09-17 NOTE — Telephone Encounter (Signed)
[facility] aware to change the order from occupational  Therapy to Physical Therapy.

## 2015-10-02 ENCOUNTER — Ambulatory Visit: Payer: PPO | Attending: Family Medicine | Admitting: Physical Therapy

## 2015-10-02 DIAGNOSIS — M6281 Muscle weakness (generalized): Secondary | ICD-10-CM | POA: Insufficient documentation

## 2015-10-02 DIAGNOSIS — M79604 Pain in right leg: Secondary | ICD-10-CM | POA: Diagnosis not present

## 2015-10-02 DIAGNOSIS — R262 Difficulty in walking, not elsewhere classified: Secondary | ICD-10-CM | POA: Diagnosis not present

## 2015-10-03 NOTE — Therapy (Deleted)
[facility] [address] [place], [place], [address] Phone: [phone]   Fax:  [phone]  Physical Therapy Evaluation  Patient Details  Name: Becky Prince MRN: [id number] Date of Birth: 12/20/1925 Referring Provider: Dr. Margarita Rana  Encounter Date: 2015-10-30    Past Medical History  Diagnosis Date  . Hypertension   . Hypercholesteremia   . Depression   . Environmental allergies     Past Surgical History  Procedure Laterality Date  . Abdominal hysterectomy    . Cataract extraction w/phaco Right 06/12/2015    Procedure: CATARACT EXTRACTION PHACO AND INTRAOCULAR LENS PLACEMENT (IOC);  Surgeon: Leandrew Koyanagi, MD;  Location: [place];  Service: Ophthalmology;  Laterality: Right;    There were no vitals filed for this visit.   Therex: See HEP handouts.      Patient will benefit from skilled therapeutic intervention in order to improve the following deficits and impairments:     Visit Diagnosis: Pain In Right Leg  Muscle weakness (generalized)  Difficulty in walking, not elsewhere classified      G-Codes - October 30, 2015 2132-08-15    Functional Assessment Tool Used LEFS/ clinical impression/ pain/ joint stiffness/ muscle weakness/ difficulty walking.    Functional Limitation Mobility: Walking and moving around   Mobility: Walking and Moving Around Current Status [phone]) At least 40 percent but less than 60 percent impaired, limited or restricted   Mobility: Walking and Moving Around Goal Status [phone]) At least 1 percent but less than 20 percent impaired, limited or restricted       Problem List Patient Active Problem List   Diagnosis Date Noted  . Adaptation reaction 04/12/2015  . CN (constipation) 04/12/2015  . Contracture of finger joint 04/12/2015  . D (diarrhea) 04/12/2015  . Fatigue 04/12/2015  . Hypercholesteremia 04/12/2015  . BP (high blood pressure) 04/12/2015  . OP (osteoporosis)  04/12/2015  . Allergic rhinitis, seasonal 04/12/2015  . Body tinea 04/12/2015  . Subclinical hyperthyroidism 11/16/2014    Pura Spice 10/03/2015, 9:37 PM  [facility] [address] [place], [place], [address] Phone: [phone]   Fax:  [phone]  Name: Becky Prince MRN: [id number] Date of Birth: 1925-05-17

## 2015-10-07 ENCOUNTER — Ambulatory Visit: Payer: PPO | Admitting: Physical Therapy

## 2015-10-07 DIAGNOSIS — M79604 Pain in right leg: Secondary | ICD-10-CM | POA: Diagnosis not present

## 2015-10-07 DIAGNOSIS — M6281 Muscle weakness (generalized): Secondary | ICD-10-CM

## 2015-10-07 DIAGNOSIS — R262 Difficulty in walking, not elsewhere classified: Secondary | ICD-10-CM

## 2015-10-07 NOTE — Therapy (Signed)
[facility] [address] [place], [place], [address] Phone: [phone]   Fax:  [phone]  Physical Therapy Treatment  Patient Details  Name: ASHVIKA LLORENTE MRN: [id number] Date of Birth: 11-26-1925 Referring Provider: Dr. Margarita Rana  Encounter Date: 10/07/2015      PT End of Session - 10/07/15 1719    Visit Number 2   Number of Visits 8   Date for PT Re-Evaluation 10/30/15   Authorization - Visit Number 2   Authorization - Number of Visits 10   PT Start Time 0217   PT Stop Time 0306   PT Time Calculation (min) 49 min   Activity Tolerance Patient tolerated treatment well   Behavior During Therapy [facility] for tasks assessed/performed      Past Medical History  Diagnosis Date  . Hypertension   . Hypercholesteremia   . Depression   . Environmental allergies     Past Surgical History  Procedure Laterality Date  . Abdominal hysterectomy    . Cataract extraction w/phaco Right 06/12/2015    Procedure: CATARACT EXTRACTION PHACO AND INTRAOCULAR LENS PLACEMENT (IOC);  Surgeon: Leandrew Koyanagi, MD;  Location: [facility];  Service: Ophthalmology;  Laterality: Right;    There were no vitals filed for this visit.      Subjective Assessment - 10/07/15 1714    Subjective Pt reports that she has been having some difficulty performing HEP and is feeling "pain" with stretching activities. She reports no other changes to her activity level or sleeping habits, contuining to state that she experiences R sided hip pain if she sleeps on her R side.   Patient is accompained by: Family member   Limitations House hold activities;Other (comment)  sleeping   Patient Stated Goals pain free in R hip   Currently in Pain? Yes   Pain Score 3    Pain Location Hip  R posterior lateral hip   Pain Orientation Right   Pain Frequency Intermittent       Objective: Reviewed HEP stretching protocol with pt due to complaints of pain  while performing. Pt demonstrated limited hamstring flexibility bilaterally, most notably on the RLE with hypertonicity of medial distal insertion of hamstring. Pt with hx of knee pain, often wears a "jumpers knee" brace and has noted a large bruise on posterior proximal calf, possibly from its use. Pt with symmetrical dorsal pedis pulse and no edema in RLE to r/o blood clot. Performed supine hamstring stretch, supine ITB stretch (30 sec holds). Standing hamstring stretch bilaterally 2x 30sec holds. Standing calf stretch 2x 30 sec holds. Pt requires max verbal and tactile cueing for set up of stretches and c/o of intermittent anterior hip and posterior lateral hip pain. SPT educated on neutral pelvis and trunk during stretching to resolve c/o of hip pain. Pt performed clam shells 2x15 bilaterally and alligators 2x15 bilaterally, requiring moderate verbal and tactile cueing for proper set up. Clam shells issued as HEP.     Pt response for medical necessity: Pt will benefit from skilled PT to address flexibility, strength and hip stability. She demonstrates sensitivity to stress/tension of R hip musculature and requires frequent guidance and adjustment of activity for pain-free motion.          Plan - 10/07/15 1721    Clinical Impression Statement Pt experienced difficulty performing HEP without guidance and is not able to perform stretching regime without supervision and cueing. She has difficulty distinguishing a stretch from pain and experiences  discomfort in R hip with standing quad stretch and calf stretch. Pt demonstrates hamstring tightness, worse in the R/L. Able to perform hip strengthening ther ex at this date, and will benefit from continued therapy to fine tune stretching protocol and increase LE musculature and hip stability.    Rehab Potential Good   PT Frequency 2x / week      Patient will benefit from skilled therapeutic intervention in order to improve the following deficits and  impairments:  Decreased balance, Decreased range of motion, Decreased strength, Difficulty walking, Impaired flexibility, Improper body mechanics, Pain  Visit Diagnosis: Pain In Right Leg  Muscle weakness (generalized)  Difficulty in walking, not elsewhere classified     Problem List Patient Active Problem List   Diagnosis Date Noted  . Adaptation reaction 04/12/2015  . CN (constipation) 04/12/2015  . Contracture of finger joint 04/12/2015  . D (diarrhea) 04/12/2015  . Fatigue 04/12/2015  . Hypercholesteremia 04/12/2015  . BP (high blood pressure) 04/12/2015  . OP (osteoporosis) 04/12/2015  . Allergic rhinitis, seasonal 04/12/2015  . Body tinea 04/12/2015  . Subclinical hyperthyroidism 11/16/2014   Pura Spice, PT, DPT # [id number] Derrill Memo, SPT  10/08/2015, 3:28 PM  [facility] [address] [place], [place], [address] Phone: [phone]   Fax:  [phone]  Name: MIDGIE ACAMPORA MRN: [id number] Date of Birth: 09/25/1925

## 2015-10-08 ENCOUNTER — Ambulatory Visit
Admission: RE | Admit: 2015-10-08 | Discharge: 2015-10-08 | Disposition: A | Discharge status: Home / Self Care | Payer: PPO | Source: Ambulatory Visit | Attending: Family Medicine | Admitting: Family Medicine

## 2015-10-08 ENCOUNTER — Encounter: Payer: Self-pay | Admitting: Physical Therapy

## 2015-10-08 ENCOUNTER — Other Ambulatory Visit: Payer: Self-pay | Admitting: Family Medicine

## 2015-10-08 DIAGNOSIS — Z1231 Encounter for screening mammogram for malignant neoplasm of breast: Secondary | ICD-10-CM

## 2015-10-08 DIAGNOSIS — M810 Age-related osteoporosis without current pathological fracture: Secondary | ICD-10-CM | POA: Insufficient documentation

## 2015-10-08 DIAGNOSIS — Z780 Asymptomatic menopausal state: Secondary | ICD-10-CM | POA: Diagnosis not present

## 2015-10-08 NOTE — Therapy (Signed)
[facility] [address] [place], [place], [address] Phone: [phone]   Fax:  [phone]  Physical Therapy Evaluation  Patient Details  Name: Becky Prince MRN: [id number] Date of Birth: Oct 11, 1925 Referring Provider: Dr. Margarita Rana  Encounter Date: 10/02/2015      PT End of Session - 10/07/15 1719    PT Start Time 0217   PT Stop Time 0306   PT Time Calculation (min) 49 min   Activity Tolerance Patient tolerated treatment well   Behavior During Therapy [facility] for tasks assessed/performed      Past Medical History  Diagnosis Date  . Hypertension   . Hypercholesteremia   . Depression   . Environmental allergies     Past Surgical History  Procedure Laterality Date  . Abdominal hysterectomy    . Cataract extraction w/phaco Right 06/12/2015    Procedure: CATARACT EXTRACTION PHACO AND INTRAOCULAR LENS PLACEMENT (IOC);  Surgeon: Leandrew Koyanagi, MD;  Location: [place];  Service: Ophthalmology;  Laterality: Right;    There were no vitals filed for this visit.       Pt. Reports chronic R hip pain (3/10) with radicular symptoms into R lower leg/ foot.  Pt. Reports she has been fearful of falling due to R hip pain.       There.ex.:  See HEP/ LE stretches.  Verbal cuing provided for proper ITB stretches in supine and standing posture (pt. Prefers supine).        Pt. is a pleasant 82 y/o female with chronic h/o R hip/ LE pain and reports radicular pain into R foot.  Pt. currently reports 3/10 R hip pain at rest and increase pain with walking.  Pt. states she is sleeping well at night and has not had any falls or invasive procedures done to R hip.  Pt. presents with pain-limited R hip/ITB tightnness as compared to L hip.  Pt. strength is grossly 5/5 MMT except hip flexion 4+/5 MMT.  Pt. prefers to sleep on back with pillow between knees.  LEFS: 31 out of 80.  Pt. will benefit from skilled PT services to  increase R hip mobility/ strengthening to improve pain-free mobility/ walking.           PT Education - 10/08/15 1500    Education provided Yes   Education Details See HEP   Person(s) Educated Patient   Methods Explanation;Demonstration;Handout   Comprehension Verbalized understanding;Returned demonstration             PT Long Term Goals - 10/03/15 1516    PT LONG TERM GOAL #1   Title Pt. I with HEP to increase B hip flexion 1/2 muscle grade to 5/5 MMT to improve pain-free mobility.    Baseline Pt. strength is grossly 5/5 MMT except hip flexion 4+/5 MMT.    Time 4   Period Weeks   Status New   PT LONG TERM GOAL #2   Title Pt. will increase LEFS to >45 out of 80 to improve pain-free functional mobility.     Baseline LEFS: 31 out of 80.     Time 4   Period Weeks   Status New   PT LONG TERM GOAL #3   Title Pt. able to stand from chair with no UE assist and no c/o R hip pain to improve pain-free functional mobility.     Baseline extra time and UE assist to stand due to R hip pain.    Time 4  Period Weeks   Status New   PT LONG TERM GOAL #4   Title Pt. able to ambulate wtih normalized gait pattern and no increase c/o R hip pain to improve pain-free mobility/ safety with daily tasks.    Baseline R antalgic gait/ pain limited with increase distance walked.     Time 4   Period Weeks   Status New        Patient will benefit from skilled therapeutic intervention in order to improve the following deficits and impairments:  Decreased balance, Decreased range of motion, Decreased strength, Difficulty walking, Impaired flexibility, Improper body mechanics, Pain, Hypomobility  Visit Diagnosis: Pain In Right Leg  Muscle weakness (generalized)  Difficulty in walking, not elsewhere classified     Problem List Patient Active Problem List   Diagnosis Date Noted  . Adaptation reaction 04/12/2015  . CN (constipation) 04/12/2015  . Contracture of finger joint 04/12/2015   . D (diarrhea) 04/12/2015  . Fatigue 04/12/2015  . Hypercholesteremia 04/12/2015  . BP (high blood pressure) 04/12/2015  . OP (osteoporosis) 04/12/2015  . Allergic rhinitis, seasonal 04/12/2015  . Body tinea 04/12/2015  . Subclinical hyperthyroidism 11/16/2014   Pura Spice, PT, DPT # [phone]   10/08/2015, 3:20 PM  [facility] [address] [place], [place], [address] Phone: [phone]   Fax:  [phone]  Name: Becky Prince MRN: [id number] Date of Birth: 12-13-1925

## 2015-10-08 NOTE — Addendum Note (Signed)
Addended by: Pura Spice on: 10/08/2015 03:27 PM   Modules accepted: Orders

## 2015-10-09 ENCOUNTER — Telehealth: Payer: Self-pay

## 2015-10-09 ENCOUNTER — Ambulatory Visit: Payer: PPO | Admitting: Physical Therapy

## 2015-10-09 ENCOUNTER — Other Ambulatory Visit: Payer: Self-pay | Admitting: Family Medicine

## 2015-10-09 DIAGNOSIS — R928 Other abnormal and inconclusive findings on diagnostic imaging of breast: Secondary | ICD-10-CM

## 2015-10-09 DIAGNOSIS — M79604 Pain in right leg: Secondary | ICD-10-CM

## 2015-10-09 DIAGNOSIS — R262 Difficulty in walking, not elsewhere classified: Secondary | ICD-10-CM

## 2015-10-09 DIAGNOSIS — M6281 Muscle weakness (generalized): Secondary | ICD-10-CM

## 2015-10-09 NOTE — Therapy (Signed)
[facility] [address] [place], [place], [address] Phone: [phone]   Fax:  [phone]  Physical Therapy Treatment  Patient Details  Name: Becky Prince MRN: [id number] Date of Birth: 1925-06-30 Referring Provider: Dr. Margarita Rana  Encounter Date: 10/09/2015      PT End of Session - 10/09/15 1550    Visit Number 3   Number of Visits 8   Date for PT Re-Evaluation 10/30/15   Authorization - Visit Number 3   Authorization - Number of Visits 10   PT Start Time 0219   PT Stop Time 0305   PT Time Calculation (min) 46 min   Activity Tolerance Patient tolerated treatment well   Behavior During Therapy [facility] for tasks assessed/performed      Past Medical History  Diagnosis Date  . Hypertension   . Hypercholesteremia   . Depression   . Environmental allergies     Past Surgical History  Procedure Laterality Date  . Abdominal hysterectomy    . Cataract extraction w/phaco Right 06/12/2015    Procedure: CATARACT EXTRACTION PHACO AND INTRAOCULAR LENS PLACEMENT (IOC);  Surgeon: Leandrew Koyanagi, MD;  Location: [place];  Service: Ophthalmology;  Laterality: Right;    There were no vitals filed for this visit.      Subjective Assessment - 10/09/15 1548    Subjective Pt reports that she has not been compliant with HEP but she states that she is feeling decreased pain in R hip and has benefited from coming to PT. She states that she still has pain sleeping on R hip.   Limitations House hold activities;Other (comment)   Patient Stated Goals pain free in R hip   Currently in Pain? No/denies      Objective: Manual tx: Supine LE/lumbar stretches: (hamstring/ ITB/ gastroc/ piriformis stretch).  There ex:  Scifit L7 10 min. B UE/LE.  Supine ball knee to chest/ bridging 20x each.  Sidelying clamshells 2x15 B, total gym knee flx/ext 2x15 with ball between knees to emphasis neutral knee, mini squats in // bars 2x10. Pt with  near fall exiting TG due to posterior lean; pt required mod A to correct.  Reviewed standing/ supine ex. Program.     Pt response for medical necessity: Pt demonstrates weakness in hip and knee musculature as well as balance deficits and pain in R hip. Will benefit from skilled PT for strengthening program with emphasis on hip and knee stability.       PT Education - 10/08/15 1500    Education provided Yes   Education Details See HEP   Person(s) Educated Patient   Methods Explanation;Demonstration;Handout   Comprehension Verbalized understanding;Returned demonstration             PT Long Term Goals - 10/03/15 1516    PT LONG TERM GOAL #1   Title Pt. I with HEP to increase B hip flexion 1/2 muscle grade to 5/5 MMT to improve pain-free mobility.    Baseline Pt. strength is grossly 5/5 MMT except hip flexion 4+/5 MMT.    Time 4   Period Weeks   Status New   PT LONG TERM GOAL #2   Title Pt. will increase LEFS to >45 out of 80 to improve pain-free functional mobility.     Baseline LEFS: 31 out of 80.     Time 4   Period Weeks   Status New   PT LONG TERM GOAL #3   Title Pt. able to stand  from chair with no UE assist and no c/o R hip pain to improve pain-free functional mobility.     Baseline extra time and UE assist to stand due to R hip pain.    Time 4   Period Weeks   Status New   PT LONG TERM GOAL #4   Title Pt. able to ambulate wtih normalized gait pattern and no increase c/o R hip pain to improve pain-free mobility/ safety with daily tasks.    Baseline R antalgic gait/ pain limited with increase distance walked.     Time 4   Period Weeks   Status New           Plan - 10/09/15 1553    Clinical Impression Statement Pt demonstrates decreased muscle tightness in hamstrings. She continues to demonstrates weakness in glutes, quads and glute med and will benefit from continued strengthening program for hip and knee stability.   Rehab Potential Good   PT Frequency 2x /  week   PT Duration 4 weeks   PT Treatment/Interventions ADLs/Self Care Home Management;Aquatic Therapy;Cryotherapy;Moist Heat;Patient/family education;Neuromuscular re-education;Balance training;Therapeutic exercise;Therapeutic activities;Functional mobility training;Stair training;Gait training;Manual techniques;Passive range of motion;Dry needling   PT Next Visit Plan Increase hip mobility/ pain mgmt.     PT Home Exercise Plan See HEP   Consulted and Agree with Plan of Care Patient      Patient will benefit from skilled therapeutic intervention in order to improve the following deficits and impairments:  Decreased balance, Decreased range of motion, Decreased strength, Difficulty walking, Impaired flexibility, Improper body mechanics, Pain  Visit Diagnosis: Pain In Right Leg  Muscle weakness (generalized)  Difficulty in walking, not elsewhere classified     Problem List Patient Active Problem List   Diagnosis Date Noted  . Adaptation reaction 04/12/2015  . CN (constipation) 04/12/2015  . Contracture of finger joint 04/12/2015  . D (diarrhea) 04/12/2015  . Fatigue 04/12/2015  . Hypercholesteremia 04/12/2015  . BP (high blood pressure) 04/12/2015  . OP (osteoporosis) 04/12/2015  . Allergic rhinitis, seasonal 04/12/2015  . Body tinea 04/12/2015  . Subclinical hyperthyroidism 11/16/2014    Pura Spice, PT, DPT # [id number] Mickel Baas Chantrice Hagg SPT 10/09/2015, 3:59 PM  [facility] [address] [place], [place], [address] Phone: [phone]   Fax:  [phone]  Name: Becky Prince MRN: [id number] Date of Birth: 08/20/1925

## 2015-10-09 NOTE — Telephone Encounter (Signed)
lmtcb Emily Drozdowski, CMA  

## 2015-10-09 NOTE — Telephone Encounter (Signed)
-----   Message from Margarita Rana, MD sent at 10/09/2015  1:18 PM EDT ----- Osteoporosis in forearm, but not hip. Essentially unchanged from last check. OV if would like to consider medication. Thanks.

## 2015-10-10 ENCOUNTER — Encounter: Payer: PPO | Admitting: Physical Therapy

## 2015-10-10 NOTE — Telephone Encounter (Signed)
appt 10/14/15 at 8:30 am

## 2015-10-14 ENCOUNTER — Ambulatory Visit: Payer: PPO | Admitting: Physical Therapy

## 2015-10-14 ENCOUNTER — Ambulatory Visit (INDEPENDENT_AMBULATORY_CARE_PROVIDER_SITE_OTHER): Payer: PPO | Admitting: Family Medicine

## 2015-10-14 ENCOUNTER — Encounter: Payer: Self-pay | Admitting: Family Medicine

## 2015-10-14 VITALS — BP 158/82 | HR 60 | Temp 97.7°F | Resp 16 | Wt 137.0 lb

## 2015-10-14 DIAGNOSIS — M810 Age-related osteoporosis without current pathological fracture: Secondary | ICD-10-CM | POA: Insufficient documentation

## 2015-10-14 DIAGNOSIS — M79604 Pain in right leg: Secondary | ICD-10-CM

## 2015-10-14 DIAGNOSIS — M7071 Other bursitis of hip, right hip: Secondary | ICD-10-CM

## 2015-10-14 DIAGNOSIS — M6281 Muscle weakness (generalized): Secondary | ICD-10-CM

## 2015-10-14 DIAGNOSIS — R262 Difficulty in walking, not elsewhere classified: Secondary | ICD-10-CM

## 2015-10-14 NOTE — Progress Notes (Signed)
Patient: Becky Prince Female    DOB: June 24, 1925   82 y.o.   MRN: [id number] Visit Date: 10/14/2015  Today's Provider: Margarita Rana, MD   Chief Complaint  Patient presents with  . Osteoporosis   Subjective:    HPI   Osteoporosis: Patient complains of osteoporosis. She was diagnosed with osteoporosis by bone density scan in 10/08/2015. Patient denies history of fracture.The cause of osteoporosis is felt to be due to postmenopausal estrogen deficiency.   She is not currently being treated with calcium and vitamin D supplementation.  She is not currently being treated with bisphosphonates  Osteoporosis Risk Factors  Nonmodifiable Personal Hx of fracture as an adult: no Hx of fracture in first-degree relative: no Caucasian race: yes Advanced age: yes Female sex: yes Dementia: no Poor health/frailty: no  Potentially modifiable: Tobacco use: no Low body weight (<127 lbs): no Estrogen deficiency  early menopause (age <45) or bilateral ovariectomy: no  prolonged premenopausal amenorrhea (>1 yr): no Low calcium intake (lifelong): no Alcoholism: no Recurrent falls: no Inadequate physical activity: no   Also has abnormal mammogram and is reluctant to go back for further imaging.   Would get local treatment if early cancer. Would not get chemotherapy.    Also seeing Dr [name] for her hip.       Allergies  Allergen Reactions  . Naproxen Swelling    Had lip swelling 20 yrs ago - not sure if caused by oxycodone or naproxen  . Oxycodone-Aspirin Swelling    Had lip swelling 20 yrs ago. Not sure if caused by oxycodone or naproxen.   Current Meds  Medication Sig  . aspirin 81 MG tablet Take 81 mg by mouth daily.   . citalopram (CELEXA) 20 MG tablet 1 TABLET TABLET, ORAL, DAILY  . fluticasone (FLONASE) 50 MCG/ACT nasal spray INSTILL 2 SPRAYS INTO EACH NOSTRIL DAILY  . meloxicam (MOBIC) 7.5 MG tablet Take 7.5 mg by mouth 2 (two) times daily.  . metoprolol tartrate  (LOPRESSOR) 25 MG tablet Take 0.5 tablets (12.5 mg total) by mouth 2 (two) times daily.  . simvastatin (ZOCOR) 20 MG tablet 1 (ONE) TABLET TABLET, ORAL, EVERY EVENING  . triamterene-hydrochlorothiazide (MAXZIDE-25) 37.5-25 MG tablet Take 1 tablet by mouth daily.    Review of Systems  Constitutional: Negative.   Musculoskeletal: Positive for myalgias and back pain. Negative for joint swelling, arthralgias, gait problem, neck pain and neck stiffness.       Lower back pain; and right leg pain.     Social History  Substance Use Topics  . Smoking status: Never Smoker   . Smokeless tobacco: Never Used  . Alcohol Use: No   Objective:   BP 158/82 mmHg  Pulse 60  Temp(Src) 97.7 F (36.5 C) (Oral)  Resp 16  Wt 137 lb (62.143 kg)  Physical Exam  Constitutional: She is oriented to person, place, and time. She appears well-developed and well-nourished.  Neurological: She is alert and oriented to person, place, and time.  Skin: Skin is warm and dry.  Psychiatric: She has a normal mood and affect. Her behavior is normal. Judgment and thought content normal.        Assessment & Plan:     1. Osteoporosis Osteoporosis is only showing on her wrist;  Pt's hip is okay.  Will defer further treatment for now. Patient feels she is on too much medication.    2. Hip bursitis, right Follow up with Dr. [name].  Continue to  ice as needed.   Also called breast center, and they will contact her today about her mammogram follow up. Has agreed to proceed with further imaging.     Patient was seen and examined by Jerrell Belfast, MD, and note scribed by Ashley Royalty, CMA.   I have reviewed the document for accuracy and completeness and I agree with above. - Jerrell Belfast, MD       Margarita Rana, MD  [facility]

## 2015-10-14 NOTE — Therapy (Signed)
[facility] [address] [place], [place], [address] Phone: [phone]   Fax:  [phone]  Physical Therapy Treatment  Patient Details  Name: Becky Prince MRN: [id number] Date of Birth: 26-Dec-1925 Referring Provider: Dr. Margarita Rana  Encounter Date: 10/14/2015      PT End of Session - 10/14/15 1726    Visit Number 4   Number of Visits 8   Date for PT Re-Evaluation 10/30/15   Authorization - Visit Number 4   Authorization - Number of Visits 10   PT Start Time 0225   PT Stop Time 0351   PT Time Calculation (min) 86 min   Activity Tolerance Patient tolerated treatment well   Behavior During Therapy [facility] for tasks assessed/performed      Past Medical History  Diagnosis Date  . Hypertension   . Hypercholesteremia   . Depression   . Environmental allergies     Past Surgical History  Procedure Laterality Date  . Abdominal hysterectomy    . Cataract extraction w/phaco Right 06/12/2015    Procedure: CATARACT EXTRACTION PHACO AND INTRAOCULAR LENS PLACEMENT (IOC);  Surgeon: Leandrew Koyanagi, MD;  Location: [place];  Service: Ophthalmology;  Laterality: Right;    There were no vitals filed for this visit.      Subjective Assessment - 10/14/15 1725    Subjective Pt reports that she had an increase in R sided hip pain following last PT appointment. She states that the pain hit several hours after her appointment and abated by the following day. Otherwise, she notes an overall improvement in R pain hip.   Patient is accompained by: Family member   Limitations House hold activities;Other (comment)   Patient Stated Goals pain free in R hip   Currently in Pain? No/denies        Objective:  There ex: Scifit L7 10 min. B UE/LE (warm-up/no charge)- increase R knee muscle discomfort.  Hooklying: hip abduction R/L 3x10 with red theraband, hip flexion R/L reciprocal 3x15 with red theraband, bridging with arms crossed  3x10. Sidelying: clamshells 2x10 B, reverse clamshells 2x10 B, hip pendulum 2x15 B.  Reviewed standing/ supine ex. Program.  See handouts.  Manual tx: Supine LE/lumbar stretches: (hamstring/ ITB/ gastroc/ piriformis stretch).Pt with increase difficulty performing bridging exercise with arms crossed and hip pendulum but appropriate response for her strengthening progression.    Pt response for medical necessity: Pt demonstrates weakness in hip and knee musculature as well as balance deficits and pain in R hip. Will benefit from skilled PT for strengthening program with emphasis on hip and knee stability.         PT Long Term Goals - 10/03/15 1516    PT LONG TERM GOAL #1   Title Pt. I with HEP to increase B hip flexion 1/2 muscle grade to 5/5 MMT to improve pain-free mobility.    Baseline Pt. strength is grossly 5/5 MMT except hip flexion 4+/5 MMT.    Time 4   Period Weeks   Status New   PT LONG TERM GOAL #2   Title Pt. will increase LEFS to >45 out of 80 to improve pain-free functional mobility.     Baseline LEFS: 31 out of 80.     Time 4   Period Weeks   Status New   PT LONG TERM GOAL #3   Title Pt. able to stand from chair with no UE assist and no c/o R hip pain to improve pain-free functional  mobility.     Baseline extra time and UE assist to stand due to R hip pain.    Time 4   Period Weeks   Status New   PT LONG TERM GOAL #4   Title Pt. able to ambulate wtih normalized gait pattern and no increase c/o R hip pain to improve pain-free mobility/ safety with daily tasks.    Baseline R antalgic gait/ pain limited with increase distance walked.     Time 4   Period Weeks   Status New               Plan - 10/14/15 1728    Clinical Impression Statement Pt with delayed muscle soreness following previous PT session of lateral hip musculature. Pt with improved mobility of R hip. Continues to demonstrate weakness in glutes and hip abductors, with fatigue noted following  sustained hip abduction exercise. Will benefit from continued therapy to promote hip stability and strength.   Rehab Potential Good   PT Frequency 2x / week   PT Duration 4 weeks   PT Treatment/Interventions ADLs/Self Care Home Management;Aquatic Therapy;Cryotherapy;Moist Heat;Patient/family education;Neuromuscular re-education;Balance training;Therapeutic exercise;Therapeutic activities;Functional mobility training;Stair training;Gait training;Manual techniques;Passive range of motion;Dry needling   PT Next Visit Plan increase hip stability/strength, pain mgmt   PT Home Exercise Plan See HEP   Consulted and Agree with Plan of Care Patient      Patient will benefit from skilled therapeutic intervention in order to improve the following deficits and impairments:  Decreased balance, Decreased range of motion, Decreased strength, Difficulty walking, Impaired flexibility, Improper body mechanics, Pain  Visit Diagnosis: Pain In Right Leg  Muscle weakness (generalized)  Difficulty in walking, not elsewhere classified     Problem List Patient Active Problem List   Diagnosis Date Noted  . Osteoporosis 10/14/2015  . Hip bursitis 10/14/2015  . Adaptation reaction 04/12/2015  . CN (constipation) 04/12/2015  . Contracture of finger joint 04/12/2015  . D (diarrhea) 04/12/2015  . Fatigue 04/12/2015  . Hypercholesteremia 04/12/2015  . BP (high blood pressure) 04/12/2015  . Allergic rhinitis, seasonal 04/12/2015  . Body tinea 04/12/2015  . Subclinical hyperthyroidism 11/16/2014   Pura Spice, PT, DPT # [id number] Mickel Baas Bissing SPT  10/15/2015, 8:02 AM  [facility] [address] [place], [place], [address] Phone: [phone]   Fax:  [phone]  Name: Becky Prince MRN: [id number] Date of Birth: [birth year]

## 2015-10-15 ENCOUNTER — Ambulatory Visit: Payer: PPO

## 2015-10-15 ENCOUNTER — Other Ambulatory Visit: Payer: PPO

## 2015-10-15 ENCOUNTER — Encounter: Payer: Self-pay | Admitting: Physical Therapy

## 2015-10-16 ENCOUNTER — Encounter: Payer: Self-pay | Admitting: Physical Therapy

## 2015-10-16 ENCOUNTER — Ambulatory Visit: Payer: PPO

## 2015-10-16 ENCOUNTER — Ambulatory Visit: Payer: PPO | Admitting: Physical Therapy

## 2015-10-16 DIAGNOSIS — M79604 Pain in right leg: Secondary | ICD-10-CM

## 2015-10-16 DIAGNOSIS — R262 Difficulty in walking, not elsewhere classified: Secondary | ICD-10-CM

## 2015-10-16 DIAGNOSIS — M6281 Muscle weakness (generalized): Secondary | ICD-10-CM

## 2015-10-16 NOTE — Therapy (Signed)
[facility] [address] [place], [place], [address] Phone: [phone]   Fax:  [phone]  Physical Therapy Treatment  Patient Details  Name: Becky Prince MRN: [id number] Date of Birth: 1926-04-27 Referring Provider: Dr. Margarita Rana  Encounter Date: 10/16/2015      PT End of Session - 10/16/15 1426    Visit Number 5   Number of Visits 8   Date for PT Re-Evaluation 10/30/15   Authorization - Visit Number 5   Authorization - Number of Visits 10   PT Start Time 3299   PT Stop Time 2426   PT Time Calculation (min) 56 min   Activity Tolerance Patient tolerated treatment well   Behavior During Therapy [facility] for tasks assessed/performed      Past Medical History  Diagnosis Date  . Hypertension   . Hypercholesteremia   . Depression   . Environmental allergies     Past Surgical History  Procedure Laterality Date  . Abdominal hysterectomy    . Cataract extraction w/phaco Right 06/12/2015    Procedure: CATARACT EXTRACTION PHACO AND INTRAOCULAR LENS PLACEMENT (IOC);  Surgeon: Leandrew Koyanagi, MD;  Location: [place];  Service: Ophthalmology;  Laterality: Right;    There were no vitals filed for this visit.      Subjective Assessment - 10/16/15 1423    Subjective Pt. states she is doing well today with no c/o pain.  Pt. states when she draws R knee up, she has increase discomfort.  No R hip pain reported at this time.  Pt. returns to MD next Friday the 30th (Dr. [name]).     Patient is accompained by: Family member   Limitations House hold activities;Other (comment)   Patient Stated Goals pain free in R hip   Currently in Pain? No/denies      Objective: There ex: Scifit L7 10 min. B UE/LE (warm-up/no charge)- no pain. Tandem stance/ walking/ alt. UE and LE touches in sitting and standing (HEP practice).  Braiding L/R in //-bars with light UE touching. Sit to stands 10x with no UE assist (min. Verbal  cuing).  Supine ball ex./ knee to chest/ rotn.  Hooklying: hip abduction R/L 3x10 with red theraband (improve technique/ no pain), hip flexion R/L reciprocal 3x15 with red theraband, bridging with arms crossed 3x10. Sidelying: clamshells 2x10 B, reverse clamshells 2x10 B, hip pendulum 2x15 B.  Encouraged pt. To remain compliant with strengthening/stability ex. Program in pain-free range).  Manual tx: Supine LE/lumbar stretches: (hamstring/ ITB/ gastroc/ piriformis stretch).STM to B hips (no tenderness today).     Pt response for medical necessity: Pt benefits from PT to increase hip and knee strength as well as improve balance deficits and pain in R hip.        PT Long Term Goals - 10/17/15 8341    PT LONG TERM GOAL #1   Title Pt. I with HEP to increase B hip flexion 1/2 muscle grade to 5/5 MMT to improve pain-free mobility.    Baseline Pt. strength is grossly 5/5 MMT except hip flexion 4+/5 MMT.    Time 4   Period Weeks   Status Partially Met   PT LONG TERM GOAL #2   Title Pt. will increase LEFS to >45 out of 80 to improve pain-free functional mobility.     Baseline LEFS: 43 out of 80.     Time 4   Period Weeks   Status Achieved   PT LONG TERM GOAL #3  Title Pt. able to stand from chair with no UE assist and no c/o R hip pain to improve pain-free functional mobility.     Baseline extra time and UE assist to stand due to R hip pain.    Time 4   Period Weeks   Status Partially Met   PT LONG TERM GOAL #4   Title Pt. able to ambulate wtih normalized gait pattern and no increase c/o R hip pain to improve pain-free mobility/ safety with daily tasks.    Baseline R antalgic gait/ pain limited with increase distance walked.     Time 4   Period Weeks   Status Partially Met   PT LONG TERM GOAL #5   Title Pt. will increase LEFS to >52 out of 80 to improve pain-free mobility.     Baseline LEFS: 43 out of 80 on 10/16/15   Time 4   Period Weeks   Status New               Plan -  10/16/15 1426    Clinical Impression Statement No c/o pain in hips during stretching/ progression of ther.ex. program.  No hip tenderness noted today and improved overall hip mobility.     Rehab Potential Good   PT Frequency 2x / week   PT Duration 4 weeks   PT Treatment/Interventions ADLs/Self Care Home Management;Aquatic Therapy;Cryotherapy;Moist Heat;Patient/family education;Neuromuscular re-education;Balance training;Therapeutic exercise;Therapeutic activities;Functional mobility training;Stair training;Gait training;Manual techniques;Passive range of motion;Dry needling   PT Next Visit Plan increase hip stability/strength, pain mgmt   PT Home Exercise Plan See HEP   Consulted and Agree with Plan of Care Patient      Patient will benefit from skilled therapeutic intervention in order to improve the following deficits and impairments:  Decreased balance, Decreased range of motion, Decreased strength, Difficulty walking, Impaired flexibility, Improper body mechanics, Pain  Visit Diagnosis: Pain In Right Leg  Muscle weakness (generalized)  Difficulty in walking, not elsewhere classified     Problem List Patient Active Problem List   Diagnosis Date Noted  . Osteoporosis 10/14/2015  . Hip bursitis 10/14/2015  . Adaptation reaction 04/12/2015  . CN (constipation) 04/12/2015  . Contracture of finger joint 04/12/2015  . D (diarrhea) 04/12/2015  . Fatigue 04/12/2015  . Hypercholesteremia 04/12/2015  . BP (high blood pressure) 04/12/2015  . Allergic rhinitis, seasonal 04/12/2015  . Body tinea 04/12/2015  . Subclinical hyperthyroidism 11/16/2014   Pura Spice, PT, DPT # [phone]   10/17/2015, 8:31 PM  [facility] [address] [place], [place], [address] Phone: [phone]   Fax:  [phone]  Name: Becky Prince MRN: [id number] Date of Birth: 05-Apr-1926

## 2015-10-21 ENCOUNTER — Encounter: Payer: Self-pay | Admitting: Physical Therapy

## 2015-10-21 ENCOUNTER — Ambulatory Visit: Payer: PPO | Admitting: Physical Therapy

## 2015-10-21 DIAGNOSIS — R262 Difficulty in walking, not elsewhere classified: Secondary | ICD-10-CM

## 2015-10-21 DIAGNOSIS — M6281 Muscle weakness (generalized): Secondary | ICD-10-CM

## 2015-10-21 DIAGNOSIS — M79604 Pain in right leg: Secondary | ICD-10-CM

## 2015-10-21 NOTE — Therapy (Signed)
[facility] [address] [place], [place], [address] Phone: [phone]   Fax:  [phone]  Physical Therapy Treatment  Patient Details  Name: Becky Prince MRN: [id number] Date of Birth: 1925/10/01 Referring Provider: Dr. Margarita Rana  Encounter Date: 10/21/2015      PT End of Session - 10/21/15 1520    Visit Number 6   Number of Visits 8   Date for PT Re-Evaluation 10/30/15   Authorization - Visit Number 6   Authorization - Number of Visits 10   PT Start Time 5465   PT Stop Time 0354   PT Time Calculation (min) 50 min   Activity Tolerance Patient tolerated treatment well   Behavior During Therapy [facility] for tasks assessed/performed      Past Medical History  Diagnosis Date  . Hypertension   . Hypercholesteremia   . Depression   . Environmental allergies     Past Surgical History  Procedure Laterality Date  . Abdominal hysterectomy    . Cataract extraction w/phaco Right 06/12/2015    Procedure: CATARACT EXTRACTION PHACO AND INTRAOCULAR LENS PLACEMENT (IOC);  Surgeon: Leandrew Koyanagi, MD;  Location: [place];  Service: Ophthalmology;  Laterality: Right;    There were no vitals filed for this visit.      Subjective Assessment - 10/21/15 1519    Subjective Pt states she is not currently in pain, but continues to have intermittent bouts of pain in R hip/knee which she experienced this morning.   Patient is accompained by: Family member   Limitations House hold activities;Other (comment)   Patient Stated Goals pain free in R hip   Currently in Pain? No/denies      Objective: There ex:Nu Step L7 12 min. B UE/LE (warm-up/no charge)- increase R knee muscle discomfort.Lateral walks in // 4x67f; required min verbal cueing for foot placement and posture. Lateral step ups on 6'' step leading with BLE 2x10, required HHA on // bars. Pt with difficulty performing eccentric step down on 6'' step due to  confidence/balance/stability on RLE. Decelerated squat/sit to stand x7; pt instructed to sit back in raised chair (at height of mid patella) and count to 8 before sitting to promote control/stability. Standing hamstring stretch on 6'' step with verbal cueing for positioning. Neuromuscular re-ed: pt performed standing balance trials on // bars on air ex pad. 4 trials approx 60 seconds with UE prn with BLE. Pt demonstrates difficulty maintaining balance with weight shifting task on air ex pad with reliance on RUE assist with R weight shift.    Pt response for medical necessity: Pt demonstrates weakness in hip and knee musculature as well as balance deficits and pain in R hip. Will benefit from skilled PT for strengthening program with emphasis on hip/knee stability and balance training.        PT Education - 10/21/15 1704    Education provided Yes   Education Details see HEP   Person(s) Educated Patient   Methods Explanation;Demonstration;Handout   Comprehension Verbalized understanding;Returned demonstration             PT Long Term Goals - 10/17/15 0927    PT LONG TERM GOAL #1   Title Pt. I with HEP to increase B hip flexion 1/2 muscle grade to 5/5 MMT to improve pain-free mobility.    Baseline Pt. strength is grossly 5/5 MMT except hip flexion 4+/5 MMT.    Time 4   Period Weeks   Status Partially Met  PT LONG TERM GOAL #2   Title Pt. will increase LEFS to >45 out of 80 to improve pain-free functional mobility.     Baseline LEFS: 43 out of 80.     Time 4   Period Weeks   Status Achieved   PT LONG TERM GOAL #3   Title Pt. able to stand from chair with no UE assist and no c/o R hip pain to improve pain-free functional mobility.     Baseline extra time and UE assist to stand due to R hip pain.    Time 4   Period Weeks   Status Partially Met   PT LONG TERM GOAL #4   Title Pt. able to ambulate wtih normalized gait pattern and no increase c/o R hip pain to improve pain-free  mobility/ safety with daily tasks.    Baseline R antalgic gait/ pain limited with increase distance walked.     Time 4   Period Weeks   Status Partially Met   PT LONG TERM GOAL #5   Title Pt. will increase LEFS to >52 out of 80 to improve pain-free mobility.     Baseline LEFS: 43 out of 80 on 10/16/15   Time 4   Period Weeks   Status New               Plan - 10/21/15 1705    Clinical Impression Statement Pt experiences mild muscle soreness post PT session in RLE. Pt demonstrates difficulty with single leg/tandem balance tasks and will benefit from continued balance training. Pt demonstrating increased control of hip and knee with decelerated sit to stand task.   Rehab Potential Good   PT Frequency 2x / week   PT Duration 4 weeks   PT Treatment/Interventions ADLs/Self Care Home Management;Aquatic Therapy;Cryotherapy;Moist Heat;Patient/family education;Neuromuscular re-education;Balance training;Therapeutic exercise;Therapeutic activities;Functional mobility training;Stair training;Gait training;Manual techniques;Passive range of motion;Dry needling   PT Next Visit Plan increase hip stability/strength, pain mgmt.   Send MD progress note next tx. (1 more tx. scheduled).     PT Home Exercise Plan See HEP   Consulted and Agree with Plan of Care Patient      Patient will benefit from skilled therapeutic intervention in order to improve the following deficits and impairments:  Decreased balance, Decreased range of motion, Decreased strength, Difficulty walking, Impaired flexibility, Improper body mechanics, Pain  Visit Diagnosis: Pain In Right Leg  Muscle weakness (generalized)  Difficulty in walking, not elsewhere classified     Problem List Patient Active Problem List   Diagnosis Date Noted  . Osteoporosis 10/14/2015  . Hip bursitis 10/14/2015  . Adaptation reaction 04/12/2015  . CN (constipation) 04/12/2015  . Contracture of finger joint 04/12/2015  . D (diarrhea)  04/12/2015  . Fatigue 04/12/2015  . Hypercholesteremia 04/12/2015  . BP (high blood pressure) 04/12/2015  . Allergic rhinitis, seasonal 04/12/2015  . Body tinea 04/12/2015  . Subclinical hyperthyroidism 11/16/2014   Pura Spice, PT, DPT # [phone] Derrill Memo, SPT  10/21/2015, 7:36 PM  [facility] [address] [place], [place], [address] Phone: [phone]   Fax:  [phone]  Name: NATIVIDAD SCHLOSSER MRN: [id number] Date of Birth: [birth year]

## 2015-10-23 ENCOUNTER — Ambulatory Visit: Payer: PPO | Admitting: Physical Therapy

## 2015-10-23 DIAGNOSIS — R262 Difficulty in walking, not elsewhere classified: Secondary | ICD-10-CM

## 2015-10-23 DIAGNOSIS — M79604 Pain in right leg: Secondary | ICD-10-CM

## 2015-10-23 DIAGNOSIS — M6281 Muscle weakness (generalized): Secondary | ICD-10-CM

## 2015-10-23 NOTE — Therapy (Signed)
[place] [address] [place], [place], [address] Phone: [phone]   Fax:  [phone]  Physical Therapy Treatment  Patient Details  Name: Becky Prince MRN: [id number] Date of Birth: June 28, 1925 Referring Provider: Dr. Margarita Rana  Encounter Date: 10/23/2015      PT End of Session - 10/23/15 1509    Visit Number 7   Number of Visits 8   Date for PT Re-Evaluation 10/30/15   Authorization - Visit Number 7   Authorization - Number of Visits 10   PT Start Time 6967   PT Stop Time 8938   PT Time Calculation (min) 48 min   Activity Tolerance Patient tolerated treatment well   Behavior During Therapy [facility] for tasks assessed/performed      Past Medical History  Diagnosis Date  . Hypertension   . Hypercholesteremia   . Depression   . Environmental allergies     Past Surgical History  Procedure Laterality Date  . Abdominal hysterectomy    . Cataract extraction w/phaco Right 06/12/2015    Procedure: CATARACT EXTRACTION PHACO AND INTRAOCULAR LENS PLACEMENT (IOC);  Surgeon: Leandrew Koyanagi, MD;  Location: [place];  Service: Ophthalmology;  Laterality: Right;    There were no vitals filed for this visit.      Subjective Assessment - 10/23/15 1508    Subjective Pt. states she is doing well today.  No new complaints and reports no hip or knee pain.  Pt. states she returns to MD this Friday.     Patient is accompained by: Family member   Limitations House hold activities;Other (comment)   Patient Stated Goals pain free in R hip   Currently in Pain? No/denies         Objective: There ex: Scifit L7 10 min. B UE/LE (warm-up/no charge)- no pain. Tandem stance/ walking/ recip. Step touches with toe 20x (cuing for no UE assist). Resisted gait 1BTB 5x all 4-planes (no UE assist). Reviewed HEP in depth.  Encouraged pt. To remain complaint with HEP and try to complete a couple exercises/ stretches each day.  Manual tx: Supine LE/lumbar stretches: (hamstring/ ITB/ gastroc/ piriformis stretch).STM to B hips (no tenderness noted over R hip/ITB region).    Pt response for medical necessity: Pt benefits from PT to increase hip and knee strength as well as improve balance deficits and pain in R hip.  Discharge from PT at this time with focus on independent HEP.           PT Long Term Goals - 10/23/15 1617    PT LONG TERM GOAL #1   Title Pt. I with HEP to increase B hip flexion 1/2 muscle grade to 5/5 MMT to improve pain-free mobility.    Baseline Pt. strength is grossly 5/5 MMT except hip flexion 4+/5 MMT.    Time 4   Period Weeks   Status Partially Met   PT LONG TERM GOAL #2   Title Pt. will increase LEFS to >45 out of 80 to improve pain-free functional mobility.     Baseline LEFS: 43 out of 80.     Time 4   Period Weeks   Status Achieved   PT LONG TERM GOAL #3   Title Pt. able to stand from chair with no UE assist and no c/o R hip pain to improve pain-free functional mobility.     Baseline no pain today   Time 4   Period Weeks   Status Achieved  PT LONG TERM GOAL #4   Title Pt. able to ambulate wtih normalized gait pattern and no increase c/o R hip pain to improve pain-free mobility/ safety with daily tasks.    Baseline slight R antalgic gait/ pain limited with increase distance walked.     Time 4   Period Weeks   Status Partially Met   PT LONG TERM GOAL #5   Title Pt. will increase LEFS to >52 out of 80 to improve pain-free mobility.     Baseline LEFS: 43 out of 80 on 10/16/15   Time 4   Period Weeks   Status On-going            Plan - November 05, 2015 1614    Clinical Impression Statement Pt. has progressed well towards all PT goals.  Pt. shows marked improvement with tandem stance/walking and R hip stability with walking/wt. shifting tasks in clinic.  No c/o R hip tenderness or pain reported t/o tx. session.  Pt. has progressive HEP and PT recommends pt. participate with a  couple ex./ stretches each day to maintain functional pain-free gains.     Rehab Potential Good   PT Frequency 2x / week   PT Duration 4 weeks   PT Treatment/Interventions ADLs/Self Care Home Management;Aquatic Therapy;Cryotherapy;Moist Heat;Patient/family education;Neuromuscular re-education;Balance training;Therapeutic exercise;Therapeutic activities;Functional mobility training;Stair training;Gait training;Manual techniques;Passive range of motion;Dry needling   PT Next Visit Plan Discharge to HEP.  Pt. instructed to contact PT after MD appt. if any changes in POC.     PT Home Exercise Plan See HEP      Patient will benefit from skilled therapeutic intervention in order to improve the following deficits and impairments:  Decreased balance, Decreased range of motion, Decreased strength, Difficulty walking, Impaired flexibility, Improper body mechanics, Pain  Visit Diagnosis: Pain In Right Leg  Muscle weakness (generalized)  Difficulty in walking, not elsewhere classified       G-Codes - Nov 05, 2015 1618    Functional Assessment Tool Used LEFS/ clinical impression/ pain/ joint stiffness/ muscle weakness/ difficulty walking.    Functional Limitation Mobility: Walking and moving around   Mobility: Walking and Moving Around Current Status [phone]) At least 1 percent but less than 20 percent impaired, limited or restricted   Mobility: Walking and Moving Around Goal Status [phone]) At least 1 percent but less than 20 percent impaired, limited or restricted   Mobility: Walking and Moving Around Discharge Status [phone]) At least 1 percent but less than 20 percent impaired, limited or restricted      Problem List Patient Active Problem List   Diagnosis Date Noted  . Osteoporosis 10/14/2015  . Hip bursitis 10/14/2015  . Adaptation reaction 04/12/2015  . CN (constipation) 04/12/2015  . Contracture of finger joint 04/12/2015  . D (diarrhea) 04/12/2015  . Fatigue 04/12/2015  .  Hypercholesteremia 04/12/2015  . BP (high blood pressure) 04/12/2015  . Allergic rhinitis, seasonal 04/12/2015  . Body tinea 04/12/2015  . Subclinical hyperthyroidism 11/16/2014   Pura Spice, PT, DPT # [id number]   11-05-2015, 4:20 PM  [place] [facility] [address] [place], [place], [address] Phone: [phone]   Fax:  [phone]  Name: Becky Prince MRN: [id number] Date of Birth: Oct 02, 1925

## 2015-10-25 ENCOUNTER — Other Ambulatory Visit: Payer: Self-pay | Admitting: Physical Medicine and Rehabilitation

## 2015-10-25 DIAGNOSIS — M5417 Radiculopathy, lumbosacral region: Secondary | ICD-10-CM

## 2015-10-25 DIAGNOSIS — M5416 Radiculopathy, lumbar region: Secondary | ICD-10-CM | POA: Diagnosis not present

## 2015-10-25 DIAGNOSIS — M5136 Other intervertebral disc degeneration, lumbar region: Secondary | ICD-10-CM | POA: Diagnosis not present

## 2015-10-30 ENCOUNTER — Ambulatory Visit
Admission: RE | Admit: 2015-10-30 | Discharge: 2015-10-30 | Disposition: A | Discharge status: Home / Self Care | Payer: PPO | Source: Ambulatory Visit | Attending: Family Medicine | Admitting: Family Medicine

## 2015-10-30 DIAGNOSIS — R928 Other abnormal and inconclusive findings on diagnostic imaging of breast: Secondary | ICD-10-CM | POA: Insufficient documentation

## 2015-10-30 DIAGNOSIS — N6489 Other specified disorders of breast: Secondary | ICD-10-CM | POA: Diagnosis not present

## 2015-11-13 ENCOUNTER — Ambulatory Visit
Admission: RE | Admit: 2015-11-13 | Discharge: 2015-11-13 | Disposition: A | Discharge status: Home / Self Care | Payer: PPO | Source: Ambulatory Visit | Attending: Physical Medicine and Rehabilitation | Admitting: Physical Medicine and Rehabilitation

## 2015-11-13 DIAGNOSIS — M479 Spondylosis, unspecified: Secondary | ICD-10-CM | POA: Insufficient documentation

## 2015-11-13 DIAGNOSIS — M5416 Radiculopathy, lumbar region: Secondary | ICD-10-CM | POA: Diagnosis not present

## 2015-11-13 DIAGNOSIS — M5126 Other intervertebral disc displacement, lumbar region: Secondary | ICD-10-CM | POA: Diagnosis not present

## 2015-11-13 DIAGNOSIS — M419 Scoliosis, unspecified: Secondary | ICD-10-CM | POA: Insufficient documentation

## 2015-11-13 DIAGNOSIS — M5417 Radiculopathy, lumbosacral region: Secondary | ICD-10-CM

## 2015-11-18 ENCOUNTER — Telehealth: Payer: Self-pay | Admitting: Family Medicine

## 2015-11-18 NOTE — Telephone Encounter (Signed)
Some degenerative disc disease noted (arthritis), mild curvature changes and small bulging disc worst at L3-L4 and L4-L5. This was sent to Dr. [name] as well so I am sure he will be following up on this.

## 2015-11-19 NOTE — Telephone Encounter (Signed)
Advised patient of results. Results were printed and left up front per patient's request.

## 2015-11-21 ENCOUNTER — Encounter: Payer: Self-pay | Admitting: Physician Assistant

## 2015-11-21 ENCOUNTER — Ambulatory Visit (INDEPENDENT_AMBULATORY_CARE_PROVIDER_SITE_OTHER): Payer: PPO | Admitting: Physician Assistant

## 2015-11-21 VITALS — BP 110/60 | HR 83 | Temp 98.6°F | Wt 134.8 lb

## 2015-11-21 DIAGNOSIS — M5441 Lumbago with sciatica, right side: Secondary | ICD-10-CM | POA: Diagnosis not present

## 2015-11-21 DIAGNOSIS — N281 Cyst of kidney, acquired: Secondary | ICD-10-CM

## 2015-11-21 DIAGNOSIS — R350 Frequency of micturition: Secondary | ICD-10-CM | POA: Diagnosis not present

## 2015-11-21 DIAGNOSIS — Q6100 Congenital renal cyst, unspecified: Secondary | ICD-10-CM | POA: Diagnosis not present

## 2015-11-21 NOTE — Patient Instructions (Signed)
Renal cysts are sacs of fluid that form in the kidneys. They are usually characterized a simple cysts, meaning they have a thin wall and contain water-like fluid. Renal cysts are fairly common in older people and usually do not cause symptoms or harm.   Back Exercises If you have pain in your back, do these exercises 2-3 times each day or as told by your doctor. When the pain goes away, do the exercises once each day, but repeat the steps more times for each exercise (do more repetitions). If you do not have pain in your back, do these exercises once each day or as told by your doctor. EXERCISES Single Knee to Chest Do these steps 3-5 times in a row for each leg: 1. Lie on your back on a firm bed or the floor with your legs stretched out. 2. Bring one knee to your chest. 3. Hold your knee to your chest by grabbing your knee or thigh. 4. Pull on your knee until you feel a gentle stretch in your lower back. 5. Keep doing the stretch for 10-30 seconds. 6. Slowly let go of your leg and straighten it. Pelvic Tilt Do these steps 5-10 times in a row: 1. Lie on your back on a firm bed or the floor with your legs stretched out. 2. Bend your knees so they point up to the ceiling. Your feet should be flat on the floor. 3. Tighten your lower belly (abdomen) muscles to press your lower back against the floor. This will make your tailbone point up to the ceiling instead of pointing down to your feet or the floor. 4. Stay in this position for 5-10 seconds while you gently tighten your muscles and breathe evenly. Cat-Cow Do these steps until your lower back bends more easily: 1. Get on your hands and knees on a firm surface. Keep your hands under your shoulders, and keep your knees under your hips. You may put padding under your knees. 2. Let your head hang down, and make your tailbone point down to the floor so your lower back is round like the back of a cat. 3. Stay in this position for 5  seconds. 4. Slowly lift your head and make your tailbone point up to the ceiling so your back hangs low (sags) like the back of a cow. 5. Stay in this position for 5 seconds. Press-Ups Do these steps 5-10 times in a row: 1. Lie on your belly (face-down) on the floor. 2. Place your hands near your head, about shoulder-width apart. 3. While you keep your back relaxed and keep your hips on the floor, slowly straighten your arms to raise the top half of your body and lift your shoulders. Do not use your back muscles. To make yourself more comfortable, you may change where you place your hands. 4. Stay in this position for 5 seconds. 5. Slowly return to lying flat on the floor. Bridges Do these steps 10 times in a row: 1. Lie on your back on a firm surface. 2. Bend your knees so they point up to the ceiling. Your feet should be flat on the floor. 3. Tighten your butt muscles and lift your butt off of the floor until your waist is almost as high as your knees. If you do not feel the muscles working in your butt and the back of your thighs, slide your feet 1-2 inches farther away from your butt. 4. Stay in this position for 3-5 seconds. 5. Slowly lower  your butt to the floor, and let your butt muscles relax. If this exercise is too easy, try doing it with your arms crossed over your chest. Belly Crunches Do these steps 5-10 times in a row: 1. Lie on your back on a firm bed or the floor with your legs stretched out. 2. Bend your knees so they point up to the ceiling. Your feet should be flat on the floor. 3. Cross your arms over your chest. 4. Tip your chin a little bit toward your chest but do not bend your neck. 5. Tighten your belly muscles and slowly raise your chest just enough to lift your shoulder blades a tiny bit off of the floor. 6. Slowly lower your chest and your head to the floor. Back Lifts Do these steps 5-10 times in a row: 1. Lie on your belly (face-down) with your arms at your  sides, and rest your forehead on the floor. 2. Tighten the muscles in your legs and your butt. 3. Slowly lift your chest off of the floor while you keep your hips on the floor. Keep the back of your head in line with the curve in your back. Look at the floor while you do this. 4. Stay in this position for 3-5 seconds. 5. Slowly lower your chest and your face to the floor. GET HELP IF:  Your back pain gets a lot worse when you do an exercise.  Your back pain does not lessen 2 hours after you exercise. If you have any of these problems, stop doing the exercises. Do not do them again unless your doctor says it is okay. GET HELP RIGHT AWAY IF:  You have sudden, very bad back pain. If this happens, stop doing the exercises. Do not do them again unless your doctor says it is okay.   This information is not intended to replace advice given to you by your health care provider. Make sure you discuss any questions you have with your health care provider.   Document Released: 05/16/2010 Document Revised: 01/02/2015 Document Reviewed: 06/07/2014 Elsevier Interactive Patient Education [employer].

## 2015-11-21 NOTE — Progress Notes (Signed)
Patient: Becky Prince Female    DOB: [birth year]   82 y.o.   MRN: [id number] Visit Date: 11/21/2015  Today's Provider: Mar Daring, PA-C   Chief Complaint  Patient presents with  . Back Pain   Subjective:    HPI Patient is here with c/o of low back pain on the right side. She has been seen for this previously by Dr. Venia Minks and has been followed by Dr. [name]. She had a MRI done on the 07/19 ans she reports she has questions for the provider regarding the results and finding of renal cyst in the right kidney. She also reports that she does have frequent urination only at night. She gets up every 2 hours to urinate started last week. She denies Dysuria or Hematuria.     Allergies  Allergen Reactions  . Naproxen Swelling    Had lip swelling 20 yrs ago - not sure if caused by oxycodone or naproxen  . Oxycodone-Aspirin Swelling    Had lip swelling 20 yrs ago. Not sure if caused by oxycodone or naproxen.   Current Meds  Medication Sig  . aspirin 81 MG tablet Take 81 mg by mouth daily.   . citalopram (CELEXA) 20 MG tablet 1 TABLET TABLET, ORAL, DAILY  . fluticasone (FLONASE) 50 MCG/ACT nasal spray INSTILL 2 SPRAYS INTO EACH NOSTRIL DAILY  . meloxicam (MOBIC) 7.5 MG tablet Take 7.5 mg by mouth 2 (two) times daily.  . metoprolol tartrate (LOPRESSOR) 25 MG tablet Take 0.5 tablets (12.5 mg total) by mouth 2 (two) times daily.  . simvastatin (ZOCOR) 20 MG tablet 1 (ONE) TABLET TABLET, ORAL, EVERY EVENING  . triamterene-hydrochlorothiazide (MAXZIDE-25) 37.5-25 MG tablet Take 1 tablet by mouth daily.    Review of Systems  Constitutional: Negative.   Respiratory: Negative.   Cardiovascular: Negative for chest pain, palpitations and leg swelling.  Genitourinary: Positive for frequency. Negative for dysuria and hematuria.  Musculoskeletal: Positive for back pain and myalgias.  Neurological: Negative for weakness and numbness.  Hematological: Bruises/bleeds easily.     Social History  Substance Use Topics  . Smoking status: Never Smoker  . Smokeless tobacco: Never Used  . Alcohol use No   Objective:   BP 110/60 (BP Location: Right Arm, Patient Position: Sitting, Cuff Size: Small)   Pulse 83   Temp 98.6 F (37 C) (Oral)   Wt 134 lb 12.8 oz (61.1 kg)   BMI 21.11 kg/m   Physical Exam  Constitutional: She appears well-developed and well-nourished. No distress.  Cardiovascular: Normal rate, regular rhythm and normal heart sounds.  Exam reveals no gallop and no friction rub.   No murmur heard. Pulmonary/Chest: Effort normal and breath sounds normal. No respiratory distress. She has no wheezes. She has no rales.  Abdominal: Soft. Bowel sounds are normal. She exhibits no distension and no mass. There is no tenderness. There is no rebound and no guarding.  Skin: She is not diaphoretic.  Vitals reviewed.     Assessment & Plan:     1. Bilateral low back pain with right-sided sciatica Followed by Dr. [name] and has an appt with him at the end of August for follow up.  2. Renal cyst Discussed that renal cyst are a common, incidental finding. Most are benign and do not change. She voiced understanding. Denies any UTI symptoms currently with exception of frequency. She was unable to leave urine specimen today but wanted to take a urine cup home to collect specimen  and bring back if she felt it was necessary.       Mar Daring, PA-C  [facility]

## 2015-11-27 DIAGNOSIS — S3992XA Unspecified injury of lower back, initial encounter: Secondary | ICD-10-CM | POA: Diagnosis not present

## 2015-11-27 DIAGNOSIS — M545 Low back pain: Secondary | ICD-10-CM | POA: Diagnosis not present

## 2015-12-02 ENCOUNTER — Other Ambulatory Visit (INDEPENDENT_AMBULATORY_CARE_PROVIDER_SITE_OTHER): Payer: PPO | Admitting: Physician Assistant

## 2015-12-02 ENCOUNTER — Other Ambulatory Visit: Payer: Self-pay | Admitting: Physician Assistant

## 2015-12-02 ENCOUNTER — Telehealth: Payer: Self-pay

## 2015-12-02 ENCOUNTER — Other Ambulatory Visit: Payer: Self-pay

## 2015-12-02 DIAGNOSIS — R300 Dysuria: Secondary | ICD-10-CM | POA: Diagnosis not present

## 2015-12-02 DIAGNOSIS — M545 Low back pain, unspecified: Secondary | ICD-10-CM

## 2015-12-02 DIAGNOSIS — H40003 Preglaucoma, unspecified, bilateral: Secondary | ICD-10-CM | POA: Diagnosis not present

## 2015-12-02 DIAGNOSIS — R350 Frequency of micturition: Secondary | ICD-10-CM | POA: Diagnosis not present

## 2015-12-02 LAB — POCT URINALYSIS DIPSTICK
Bilirubin, UA: NEGATIVE
GLUCOSE UA: NEGATIVE
Ketones, UA: NEGATIVE
NITRITE UA: NEGATIVE
Protein, UA: NEGATIVE
Spec Grav, UA: 1.01
UROBILINOGEN UA: 0.2
pH, UA: 6

## 2015-12-02 NOTE — Progress Notes (Signed)
Patient had been unable to give urine specimen on day of appointment but brought one today. Will check UA to make sure not UTI as cause of low back pain. I do not think it will be as she has had chronic low back pain due to DDD.

## 2015-12-02 NOTE — Telephone Encounter (Signed)
Urine culture ordered

## 2015-12-02 NOTE — Progress Notes (Signed)
Updated medication list

## 2015-12-04 ENCOUNTER — Telehealth: Payer: Self-pay

## 2015-12-04 LAB — URINE CULTURE

## 2015-12-04 NOTE — Telephone Encounter (Signed)
LMTCB  Thanks,  -Derric Dealmeida 

## 2015-12-04 NOTE — Telephone Encounter (Signed)
Pt is returning call.  CB#[phone]/MW

## 2015-12-04 NOTE — Telephone Encounter (Signed)
-----   Message from Mar Daring, PA-C sent at 12/04/2015  8:28 AM EDT ----- Urine culture was negative.

## 2015-12-04 NOTE — Telephone Encounter (Signed)
Patient advised as directed below.  Thanks,  -Breelynn Bankert 

## 2015-12-09 DIAGNOSIS — H40003 Preglaucoma, unspecified, bilateral: Secondary | ICD-10-CM | POA: Diagnosis not present

## 2015-12-10 ENCOUNTER — Other Ambulatory Visit: Payer: Self-pay | Admitting: Family Medicine

## 2015-12-10 DIAGNOSIS — I10 Essential (primary) hypertension: Secondary | ICD-10-CM

## 2015-12-11 NOTE — Telephone Encounter (Signed)
Has CPE appointment with you on 02/12/2016. Saw you for acute issue on 11/21/2015. Renaldo Fiddler, CMA

## 2015-12-15 ENCOUNTER — Other Ambulatory Visit: Payer: Self-pay | Admitting: Family Medicine

## 2015-12-15 DIAGNOSIS — F4329 Adjustment disorder with other symptoms: Secondary | ICD-10-CM

## 2015-12-16 DIAGNOSIS — M4806 Spinal stenosis, lumbar region: Secondary | ICD-10-CM | POA: Diagnosis not present

## 2015-12-16 DIAGNOSIS — M5416 Radiculopathy, lumbar region: Secondary | ICD-10-CM | POA: Diagnosis not present

## 2015-12-16 DIAGNOSIS — M5136 Other intervertebral disc degeneration, lumbar region: Secondary | ICD-10-CM | POA: Diagnosis not present

## 2016-01-02 DIAGNOSIS — M5416 Radiculopathy, lumbar region: Secondary | ICD-10-CM | POA: Diagnosis not present

## 2016-01-02 DIAGNOSIS — M5136 Other intervertebral disc degeneration, lumbar region: Secondary | ICD-10-CM | POA: Diagnosis not present

## 2016-01-14 DIAGNOSIS — Z760 Encounter for issue of repeat prescription: Secondary | ICD-10-CM | POA: Diagnosis not present

## 2016-01-14 DIAGNOSIS — M545 Low back pain: Secondary | ICD-10-CM | POA: Diagnosis not present

## 2016-02-07 DIAGNOSIS — M5416 Radiculopathy, lumbar region: Secondary | ICD-10-CM | POA: Diagnosis not present

## 2016-02-07 DIAGNOSIS — M5136 Other intervertebral disc degeneration, lumbar region: Secondary | ICD-10-CM | POA: Diagnosis not present

## 2016-02-12 ENCOUNTER — Encounter: Payer: Self-pay | Admitting: Physician Assistant

## 2016-02-12 ENCOUNTER — Ambulatory Visit (INDEPENDENT_AMBULATORY_CARE_PROVIDER_SITE_OTHER): Payer: PPO | Admitting: Physician Assistant

## 2016-02-12 VITALS — BP 90/52 | HR 72 | Temp 98.7°F | Resp 16 | Wt 132.0 lb

## 2016-02-12 DIAGNOSIS — E0590 Thyrotoxicosis, unspecified without thyrotoxic crisis or storm: Secondary | ICD-10-CM | POA: Diagnosis not present

## 2016-02-12 DIAGNOSIS — E7800 Pure hypercholesterolemia, unspecified: Secondary | ICD-10-CM | POA: Diagnosis not present

## 2016-02-12 DIAGNOSIS — I10 Essential (primary) hypertension: Secondary | ICD-10-CM

## 2016-02-12 MED ORDER — TRIAMTERENE-HCTZ 37.5-25 MG PO TABS: 0.5000 | ORAL_TABLET | Freq: Every day | ORAL | 3 refills | Status: DC

## 2016-02-12 NOTE — Progress Notes (Signed)
Patient: Becky Prince Female    DOB: 28-Jun-1925   82 y.o.   MRN: [id number] Visit Date: 02/12/2016  Today's Provider: Mar Daring, PA-C   Chief Complaint  Patient presents with  . Hypertension  . Hyperlipidemia   Subjective:    HPI  Hypertension, follow-up:  BP Readings from Last 3 Encounters:  02/12/16 (!) 90/52  11/21/15 110/60  10/14/15 (!) 158/82    She was last seen for hypertension 5 months ago.  BP at that visit was 116/62. Management changes since that visit include started on Triamterene-HCTZ 37.5-25 mg and D/C Maxzide. She reports excellent compliance with treatment. She is not having side effects.  She is exercising. She is adherent to low salt diet.   Outside blood pressures are stable. She is experiencing none.  Patient denies chest pain and lower extremity edema.   Cardiovascular risk factors include advanced age (older than 55 for men, 66 for women), dyslipidemia and hypertension.  Use of agents associated with hypertension: none.     Weight trend: stable Wt Readings from Last 3 Encounters:  02/12/16 132 lb (59.9 kg)  11/21/15 134 lb 12.8 oz (61.1 kg)  10/14/15 137 lb (62.1 kg)    Current diet: in general, a "healthy" diet    ------------------------------------------------------------------------      Allergies  Allergen Reactions  . Naproxen Swelling    Had lip swelling 20 yrs ago - not sure if caused by oxycodone or naproxen  . Oxycodone-Aspirin Swelling    Had lip swelling 20 yrs ago. Not sure if caused by oxycodone or naproxen.     Current Outpatient Prescriptions:  .  aspirin 81 MG tablet, Take 81 mg by mouth daily. , Disp: , Rfl:  .  citalopram (CELEXA) 20 MG tablet, 1 TABLET TABLET, ORAL, DAILY, Disp: 90 tablet, Rfl: 1 .  fluticasone (FLONASE) 50 MCG/ACT nasal spray, INSTILL 2 SPRAYS INTO EACH NOSTRIL DAILY, Disp: 48 g, Rfl: 1 .  metoprolol tartrate (LOPRESSOR) 25 MG tablet, TAKE 1/2 TABLETS (12.5 MG TOTAL) BY  MOUTH 2 (TWO) TIMES DAILY., Disp: 90 tablet, Rfl: 3 .  Multiple Vitamins-Minerals (MULTIVITAMIN) tablet, Take 1 tablet by mouth daily., Disp: 30 tablet, Rfl: 0 .  Omega-3 Fatty Acids (FISH OIL) 1200 MG CAPS, Take 1 capsule (1,200 mg total) by mouth 2 (two) times daily., Disp: 60 capsule, Rfl: 0 .  simvastatin (ZOCOR) 20 MG tablet, 1 (ONE) TABLET TABLET, ORAL, EVERY EVENING, Disp: 90 tablet, Rfl: 1 .  traMADol (ULTRAM) 50 MG tablet, Take 1 tablet (50 mg total) by mouth every 6 (six) hours as needed., Disp: 30 tablet, Rfl: 0 .  triamterene-hydrochlorothiazide (MAXZIDE-25) 37.5-25 MG tablet, Take 1 tablet by mouth daily., Disp: 90 tablet, Rfl: 3 .  vitamin E 100 UNIT capsule, Take 2 capsules (200 Units total) by mouth daily., Disp: 60 capsule, Rfl: 0  Review of Systems  Constitutional: Negative.   HENT: Negative.   Respiratory: Negative.   Cardiovascular: Negative.   Gastrointestinal: Negative.   Neurological: Negative.     Social History  Substance Use Topics  . Smoking status: Never Smoker  . Smokeless tobacco: Never Used  . Alcohol use No   Objective:   BP (!) 90/52 (BP Location: Left Arm, Patient Position: Sitting, Cuff Size: Normal)   Pulse 72   Temp 98.7 F (37.1 C) (Oral)   Resp 16   Wt 132 lb (59.9 kg)   SpO2 96%   BMI 20.67 kg/m   Physical  Exam  Constitutional: She appears well-developed and well-nourished. No distress.  Neck: Normal range of motion. Neck supple. No JVD present. No tracheal deviation present. No thyromegaly present.  Cardiovascular: Normal rate, regular rhythm and normal heart sounds.  Exam reveals no gallop and no friction rub.   No murmur heard. Pulmonary/Chest: Effort normal and breath sounds normal. No respiratory distress. She has no wheezes. She has no rales.  Musculoskeletal: She exhibits no edema.  Lymphadenopathy:    She has no cervical adenopathy.  Skin: She is not diaphoretic.  Psychiatric: She has a normal mood and affect. Her behavior is  normal. Judgment and thought content normal.  Vitals reviewed.     Assessment & Plan:     1. Essential hypertension BP is low today and has been running low on at home readings as well. Encouraged patient to push fluids. Will decrease maxzide and have her only take 1/2 tab daily. Continue checking BP. Will also check potassium and kidney function as below. I will see her back in 3 months for recheck. - triamterene-hydrochlorothiazide (MAXZIDE-25) 37.5-25 MG tablet; Take 0.5 tablets by mouth daily.  Dispense: 45 tablet; Refill: 3 - CBC with Differential - Comprehensive Metabolic Panel (CMET)  2. Subclinical hyperthyroidism H/O this. Will check labs as below and f/u pending results. - TSH - T4  3. Hypercholesteremia Stable on simvastatin 20mg  and fish oil 1200mg . Will check labs as below and f/u pending results. - Lipid Profile       Mar Daring, PA-C  [facility]

## 2016-02-12 NOTE — Patient Instructions (Signed)
Hypertension Hypertension, commonly called high blood pressure, is when the force of blood pumping through your arteries is too strong. Your arteries are the blood vessels that carry blood from your heart throughout your body. A blood pressure reading consists of a higher number over a lower number, such as 110/72. The higher number (systolic) is the pressure inside your arteries when your heart pumps. The lower number (diastolic) is the pressure inside your arteries when your heart relaxes. Ideally you want your blood pressure below 120/80. Hypertension forces your heart to work harder to pump blood. Your arteries may become narrow or stiff. Having untreated or uncontrolled hypertension can cause heart attack, stroke, kidney disease, and other problems. RISK FACTORS Some risk factors for high blood pressure are controllable. Others are not.  Risk factors you cannot control include:   Race. You may be at higher risk if you are African American.  Age. Risk increases with age.  Gender. Men are at higher risk than women before age 45 years. After age 65, women are at higher risk than men. Risk factors you can control include:  Not getting enough exercise or physical activity.  Being overweight.  Getting too much fat, sugar, calories, or salt in your diet.  Drinking too much alcohol. SIGNS AND SYMPTOMS Hypertension does not usually cause signs or symptoms. Extremely high blood pressure (hypertensive crisis) may cause headache, anxiety, shortness of breath, and nosebleed. DIAGNOSIS To check if you have hypertension, your health care provider will measure your blood pressure while you are seated, with your arm held at the level of your heart. It should be measured at least twice using the same arm. Certain conditions can cause a difference in blood pressure between your right and left arms. A blood pressure reading that is higher than normal on one occasion does not mean that you need treatment. If  it is not clear whether you have high blood pressure, you may be asked to return on a different day to have your blood pressure checked again. Or, you may be asked to monitor your blood pressure at home for 1 or more weeks. TREATMENT Treating high blood pressure includes making lifestyle changes and possibly taking medicine. Living a healthy lifestyle can help lower high blood pressure. You may need to change some of your habits. Lifestyle changes may include:  Following the DASH diet. This diet is high in fruits, vegetables, and whole grains. It is low in salt, red meat, and added sugars.  Keep your sodium intake below 2,300 mg per day.  Getting at least 30-45 minutes of aerobic exercise at least 4 times per week.  Losing weight if necessary.  Not smoking.  Limiting alcoholic beverages.  Learning ways to reduce stress. Your health care provider may prescribe medicine if lifestyle changes are not enough to get your blood pressure under control, and if one of the following is true:  You are 18-59 years of age and your systolic blood pressure is above 140.  You are 60 years of age or older, and your systolic blood pressure is above 150.  Your diastolic blood pressure is above 90.  You have diabetes, and your systolic blood pressure is over 140 or your diastolic blood pressure is over 90.  You have kidney disease and your blood pressure is above 140/90.  You have heart disease and your blood pressure is above 140/90. Your personal target blood pressure may vary depending on your medical conditions, your age, and other factors. HOME CARE INSTRUCTIONS    Have your blood pressure rechecked as directed by your health care provider.   Take medicines only as directed by your health care provider. Follow the directions carefully. Blood pressure medicines must be taken as prescribed. The medicine does not work as well when you skip doses. Skipping doses also puts you at risk for  problems.  Do not smoke.   Monitor your blood pressure at home as directed by your health care provider. SEEK MEDICAL CARE IF:   You think you are having a reaction to medicines taken.  You have recurrent headaches or feel dizzy.  You have swelling in your ankles.  You have trouble with your vision. SEEK IMMEDIATE MEDICAL CARE IF:  You develop a severe headache or confusion.  You have unusual weakness, numbness, or feel faint.  You have severe chest or abdominal pain.  You vomit repeatedly.  You have trouble breathing. MAKE SURE YOU:   Understand these instructions.  Will watch your condition.  Will get help right away if you are not doing well or get worse.   This information is not intended to replace advice given to you by your health care provider. Make sure you discuss any questions you have with your health care provider.   Document Released: 04/13/2005 Document Revised: 08/28/2014 Document Reviewed: 02/03/2013 Elsevier Interactive Patient Education 2016 [employer]  

## 2016-02-13 DIAGNOSIS — I10 Essential (primary) hypertension: Secondary | ICD-10-CM | POA: Diagnosis not present

## 2016-02-13 DIAGNOSIS — E7800 Pure hypercholesterolemia, unspecified: Secondary | ICD-10-CM | POA: Diagnosis not present

## 2016-02-13 DIAGNOSIS — E0590 Thyrotoxicosis, unspecified without thyrotoxic crisis or storm: Secondary | ICD-10-CM | POA: Diagnosis not present

## 2016-02-14 ENCOUNTER — Telehealth: Payer: Self-pay

## 2016-02-14 LAB — COMPREHENSIVE METABOLIC PANEL
ALBUMIN: 4.3 g/dL (ref 3.5–4.7)
ALT: 11 IU/L (ref 0–32)
AST: 18 IU/L (ref 0–40)
Albumin/Globulin Ratio: 1.7 (ref 1.2–2.2)
Alkaline Phosphatase: 85 IU/L (ref 39–117)
BUN / CREAT RATIO: 16 (ref 12–28)
BUN: 18 mg/dL (ref 8–27)
Bilirubin Total: 0.6 mg/dL (ref 0.0–1.2)
CALCIUM: 9.6 mg/dL (ref 8.7–10.3)
CO2: 27 mmol/L (ref 18–29)
CREATININE: 1.13 mg/dL — AB (ref 0.57–1.00)
Chloride: 88 mmol/L — ABNORMAL LOW (ref 96–106)
GFR calc Af Amer: 50 mL/min/{1.73_m2} — ABNORMAL LOW (ref >59)
GFR, EST NON AFRICAN AMERICAN: 43 mL/min/{1.73_m2} — AB (ref >59)
GLOBULIN, TOTAL: 2.5 g/dL (ref 1.5–4.5)
GLUCOSE: 95 mg/dL (ref 65–99)
Potassium: 4.3 mmol/L (ref 3.5–5.2)
SODIUM: 129 mmol/L — AB (ref 134–144)
Total Protein: 6.8 g/dL (ref 6.0–8.5)

## 2016-02-14 LAB — CBC WITH DIFFERENTIAL/PLATELET
BASOS ABS: 0 10*3/uL (ref 0.0–0.2)
Basos: 0 %
EOS (ABSOLUTE): 0.1 10*3/uL (ref 0.0–0.4)
EOS: 2 %
HEMATOCRIT: 37.8 % (ref 34.0–46.6)
HEMOGLOBIN: 13 g/dL (ref 11.1–15.9)
IMMATURE GRANULOCYTES: 0 %
Immature Grans (Abs): 0 10*3/uL (ref 0.0–0.1)
LYMPHS: 36 %
Lymphocytes Absolute: 2.1 10*3/uL (ref 0.7–3.1)
MCH: 31.8 pg (ref 26.6–33.0)
MCHC: 34.4 g/dL (ref 31.5–35.7)
MCV: 92 fL (ref 79–97)
MONOCYTES: 9 %
Monocytes Absolute: 0.6 10*3/uL (ref 0.1–0.9)
NEUTROS PCT: 53 %
Neutrophils Absolute: 3.1 10*3/uL (ref 1.4–7.0)
Platelets: 255 10*3/uL (ref 150–379)
RBC: 4.09 x10E6/uL (ref 3.77–5.28)
RDW: 12.8 % (ref 12.3–15.4)
WBC: 5.9 10*3/uL (ref 3.4–10.8)

## 2016-02-14 LAB — LIPID PANEL
CHOL/HDL RATIO: 2.8 ratio (ref 0.0–4.4)
CHOLESTEROL TOTAL: 170 mg/dL (ref 100–199)
HDL: 61 mg/dL (ref >39)
LDL CALC: 83 mg/dL (ref 0–99)
TRIGLYCERIDES: 128 mg/dL (ref 0–149)
VLDL CHOLESTEROL CAL: 26 mg/dL (ref 5–40)

## 2016-02-14 LAB — T4: T4, Total: 8.4 ug/dL (ref 4.5–12.0)

## 2016-02-14 LAB — TSH: TSH: 0.503 u[IU]/mL (ref 0.450–4.500)

## 2016-02-14 NOTE — Telephone Encounter (Signed)
Patient advised as below. Patient verbalizes understanding and is in agreement with treatment plan.  

## 2016-02-14 NOTE — Telephone Encounter (Signed)
-----   Message from Mar Daring, [place] sent at 02/14/2016  8:30 AM EDT ----- Kidney function has improved from previous labs 10 months ago. Sodium is a little low so add a little more salt to foods to bring up. Cholesterol is perfect. TSH and T4 are WNL.

## 2016-02-24 ENCOUNTER — Telehealth: Payer: Self-pay | Admitting: Physician Assistant

## 2016-02-24 ENCOUNTER — Ambulatory Visit: Payer: Self-pay | Admitting: Physician Assistant

## 2016-02-24 NOTE — Telephone Encounter (Signed)
Called pt back and schedule her an appt with Adriana at 4 this afternoon. Patient stated that she did have a bowel movement after she called office the first time.

## 2016-02-24 NOTE — Telephone Encounter (Signed)
Reviewed, thank you.

## 2016-02-24 NOTE — Telephone Encounter (Signed)
Pt states she is having constipation, headache, nausea and not been able to eat anything since Saturday because of the nausea.  Pt blood pressure is 144/81 and her tempeture is 99.1.  Pt states she does not feel like she can come in for an appointment and is asking if she can get a Rx called in to help with this.  [facility].  CB#[phone]/MW

## 2016-02-25 ENCOUNTER — Other Ambulatory Visit: Payer: Self-pay | Admitting: Physician Assistant

## 2016-02-25 ENCOUNTER — Encounter: Payer: Self-pay | Admitting: Physician Assistant

## 2016-02-25 ENCOUNTER — Ambulatory Visit (INDEPENDENT_AMBULATORY_CARE_PROVIDER_SITE_OTHER): Payer: PPO | Admitting: Physician Assistant

## 2016-02-25 VITALS — BP 114/60 | HR 76 | Temp 97.8°F | Resp 16 | Wt 133.0 lb

## 2016-02-25 DIAGNOSIS — R51 Headache: Secondary | ICD-10-CM

## 2016-02-25 DIAGNOSIS — R519 Headache, unspecified: Secondary | ICD-10-CM

## 2016-02-25 DIAGNOSIS — K5900 Constipation, unspecified: Secondary | ICD-10-CM

## 2016-02-25 NOTE — Telephone Encounter (Signed)
Pt has decided to have the CT.  She would like to have it ASAP.    Please call her at [phone] when you it is scheduled.  Thank sTeri

## 2016-02-25 NOTE — Telephone Encounter (Signed)
I spoke with pt.  She says her symptoms are the same she just wanted to go ahead with the CT.Marland KitchenMarland KitchenShe states she is not in a hurry.   Thanks,   -Mickel Baas

## 2016-02-25 NOTE — Patient Instructions (Signed)
Tension Headache A tension headache is a feeling of pain, pressure, or aching that is often felt over the front and sides of the head. The pain can be dull, or it can feel tight (constricting). Tension headaches are not normally associated with nausea or vomiting, and they do not get worse with physical activity. Tension headaches can last from 30 minutes to several days. This is the most common type of headache. CAUSES The exact cause of this condition is not known. Tension headaches often begin after stress, anxiety, or depression. Other triggers may include:  Alcohol.  Too much caffeine, or caffeine withdrawal.  Respiratory infections, such as colds, flu, or sinus infections.  Dental problems or teeth clenching.  Fatigue.  Holding your head and neck in the same position for a long period of time, such as while using a computer.  Smoking. SYMPTOMS Symptoms of this condition include:  A feeling of pressure around the head.  Dull, aching head pain.  Pain felt over the front and sides of the head.  Tenderness in the muscles of the head, neck, and shoulders. DIAGNOSIS This condition may be diagnosed based on your symptoms and a physical exam. Tests may be done, such as a CT scan or an MRI of your head. These tests may be done if your symptoms are severe or unusual. TREATMENT This condition may be treated with lifestyle changes and medicines to help relieve symptoms. HOME CARE INSTRUCTIONS Managing Pain  Take over-the-counter and prescription medicines only as told by your health care provider.  Lie down in a dark, quiet room when you have a headache.  If directed, apply ice to the head and neck area:  Put ice in a plastic bag.  Place a towel between your skin and the bag.  Leave the ice on for 20 minutes, 2-3 times per day.  Use a heating pad or a hot shower to apply heat to the head and neck area as told by your health care provider. Eating and Drinking  Eat meals on  a regular schedule.  Limit alcohol use.  Decrease your caffeine intake, or stop using caffeine. General Instructions  Keep all follow-up visits as told by your health care provider. This is important.  Keep a headache journal to help find out what may trigger your headaches. For example, write down:  What you eat and drink.  How much sleep you get.  Any change to your diet or medicines.  Try massage or other relaxation techniques.  Limit stress.  Sit up straight, and avoid tensing your muscles.  Do not use tobacco products, including cigarettes, chewing tobacco, or e-cigarettes. If you need help quitting, ask your health care provider.  Exercise regularly as told by your health care provider.  Get 7-9 hours of sleep, or the amount recommended by your health care provider. SEEK MEDICAL CARE IF:  Your symptoms are not helped by medicine.  You have a headache that is different from what you normally experience.  You have nausea or you vomit.  You have a fever. SEEK IMMEDIATE MEDICAL CARE IF:  Your headache becomes severe.  You have repeated vomiting.  You have a stiff neck.  You have a loss of vision.  You have problems with speech.  You have pain in your eye or ear.  You have muscular weakness or loss of muscle control.  You lose your balance or you have trouble walking.  You feel faint or you pass out.  You have confusion.     This information is not intended to replace advice given to you by your health care provider. Make sure you discuss any questions you have with your health care provider.   Document Released: 04/13/2005 Document Revised: 01/02/2015 Document Reviewed: 08/06/2014 Elsevier Interactive Patient Education 2016 Elsevier Inc.  

## 2016-02-25 NOTE — Progress Notes (Signed)
Patient: Becky Prince Female    DOB: [birth year]   82 y.o.   MRN: [id number] Visit Date: 02/25/2016  Today's Provider: Trinna Post, PA-C   Chief Complaint  Patient presents with  . Headache  . Sinusitis   Subjective:    Headache   This is a new problem. The current episode started in the past 7 days. The problem occurs constantly. The problem has been gradually improving. The pain is located in the frontal and bilateral region. The pain does not radiate. The quality of the pain is described as aching. The pain is at a severity of 2/10. Associated symptoms include dizziness, nausea and sinus pressure. Pertinent negatives include no abdominal pain, ear pain, eye pain, eye redness, fever, numbness, photophobia, rhinorrhea, seizures, sore throat or vomiting.  Sinusitis  This is a new problem. The current episode started in the past 7 days. There has been no fever. Associated symptoms include headaches and sinus pressure. Pertinent negatives include no chills, congestion, diaphoresis, ear pain or sore throat.  Constipation  This is a new problem. The current episode started in the past 7 days. The problem has been resolved since onset. The patient is not on a high fiber diet. She does not exercise regularly. There has been adequate water intake. Associated symptoms include nausea. Pertinent negatives include no abdominal pain, diarrhea, fever, rectal pain or vomiting. She has tried stool softeners for the symptoms. The treatment provided significant relief.   Headache for one week. Headache was gradual, no change in vision. Headachee is bitemporal. Not the worst headache of her life. At its worst, headache was 8 or 9. Now headache has improved to 2/10. Patient took pain medication given for her hip and back, does not know name of medication. She took this medication couple a days ago which was hard on her stomach. Took Tylenol for headache, possibly more than 4 tablets. Tylenol PM as  well. Doesn't do well with ibuprofen. Patient reveals her most concerning symptom is persistent headache and blowing blood clot out of nose.   Patient had bowel movement yesterday. Patient reports her constipation is relieved.   Allergies  Allergen Reactions  . Naproxen Swelling    Had lip swelling 20 yrs ago - not sure if caused by oxycodone or naproxen  . Oxycodone-Aspirin Swelling    Had lip swelling 20 yrs ago. Not sure if caused by oxycodone or naproxen.     Current Outpatient Prescriptions:  .  aspirin 81 MG tablet, Take 81 mg by mouth daily. , Disp: , Rfl:  .  citalopram (CELEXA) 20 MG tablet, 1 TABLET TABLET, ORAL, DAILY, Disp: 90 tablet, Rfl: 1 .  fluticasone (FLONASE) 50 MCG/ACT nasal spray, INSTILL 2 SPRAYS INTO EACH NOSTRIL DAILY, Disp: 48 g, Rfl: 1 .  metoprolol tartrate (LOPRESSOR) 25 MG tablet, TAKE 1/2 TABLETS (12.5 MG TOTAL) BY MOUTH 2 (TWO) TIMES DAILY., Disp: 90 tablet, Rfl: 3 .  Multiple Vitamins-Minerals (MULTIVITAMIN) tablet, Take 1 tablet by mouth daily., Disp: 30 tablet, Rfl: 0 .  Omega-3 Fatty Acids (FISH OIL) 1200 MG CAPS, Take 1 capsule (1,200 mg total) by mouth 2 (two) times daily., Disp: 60 capsule, Rfl: 0 .  simvastatin (ZOCOR) 20 MG tablet, 1 (ONE) TABLET TABLET, ORAL, EVERY EVENING, Disp: 90 tablet, Rfl: 1 .  traMADol (ULTRAM) 50 MG tablet, Take 1 tablet (50 mg total) by mouth every 6 (six) hours as needed., Disp: 30 tablet, Rfl: 0 .  triamterene-hydrochlorothiazide (MAXZIDE-25) 37.5-25  MG tablet, Take 0.5 tablets by mouth daily., Disp: 45 tablet, Rfl: 3 .  vitamin E 100 UNIT capsule, Take 2 capsules (200 Units total) by mouth daily., Disp: 60 capsule, Rfl: 0  Review of Systems  Constitutional: Positive for appetite change and fatigue. Negative for activity change, chills, diaphoresis, fever and unexpected weight change.  HENT: Positive for nosebleeds (Pt reports she had a blood clot yesterday come out of her nose.  Once that happened it seemed to relieve  her headache some. ), sinus pressure and voice change. Negative for congestion, ear discharge, ear pain, postnasal drip, rhinorrhea, sore throat and trouble swallowing.   Eyes: Positive for discharge and itching. Negative for photophobia, pain, redness and visual disturbance.  Respiratory: Negative.   Cardiovascular: Negative.   Gastrointestinal: Positive for constipation and nausea. Negative for abdominal distention, abdominal pain, anal bleeding, blood in stool, diarrhea, rectal pain and vomiting.       Pt reports her GI symptoms have improved.    Musculoskeletal: Positive for gait problem.  Neurological: Positive for dizziness, light-headedness and headaches. Negative for tremors, seizures, syncope, facial asymmetry, speech difficulty and numbness.    Social History  Substance Use Topics  . Smoking status: Never Smoker  . Smokeless tobacco: Never Used  . Alcohol use No   Objective:   BP 114/60 (BP Location: Left Arm, Patient Position: Sitting, Cuff Size: Normal)   Pulse 76   Temp 97.8 F (36.6 C) (Oral)   Resp 16   Wt 133 lb (60.3 kg)   BMI 20.83 kg/m   Physical Exam  Constitutional: She is oriented to person, place, and time. Vital signs are normal. She appears well-developed and well-nourished.  Non-toxic appearance. She does not have a sickly appearance. She does not appear ill. No distress.  HENT:  Head: Normocephalic and atraumatic.  Right Ear: External ear and ear canal normal.  Left Ear: External ear and ear canal normal.  Dried blood in right nare  Eyes: Pupils are equal, round, and reactive to light.  Neck: Normal range of motion and full passive range of motion without pain. Neck supple. Carotid bruit is not present. No neck rigidity. No Brudzinski's sign and no Kernig's sign noted.  Cardiovascular: Normal rate and regular rhythm.   Pulmonary/Chest: Effort normal and breath sounds normal. No respiratory distress.  Musculoskeletal: Normal range of motion. She  exhibits no tenderness or deformity.  Neurological: She is alert and oriented to person, place, and time. She has normal strength and normal reflexes. She is not disoriented. No cranial nerve deficit or sensory deficit. She exhibits normal muscle tone. She displays a negative Romberg sign. Coordination normal.  Reflex Scores:      Tricep reflexes are 2+ on the right side and 2+ on the left side.      Bicep reflexes are 2+ on the right side and 2+ on the left side.      Brachioradialis reflexes are 2+ on the right side and 2+ on the left side.      Patellar reflexes are 2+ on the right side and 2+ on the left side.      Achilles reflexes are 2+ on the right side and 2+ on the left side. Patient is cruising with furniture for assistance. No staggering or signs of instability.  Skin: Skin is warm and dry. She is not diaphoretic.  Psychiatric: She has a normal mood and affect. Her behavior is normal. Judgment and thought content normal.  Assessment & Plan:      Problem List Items Addressed This Visit    None    Visit Diagnoses    Nonintractable headache, unspecified chronicity pattern, unspecified headache type    -  Primary     Patient is 82 y/o female with history of high blood pressure reporting today with new headache. Patient with history of headaches reporting this is a different headache. Headache has improved, but considering age and change in headaches, have recommended CT head. Patient declines this today and says she will wait to see if she feels better and will call in.   Constipation seems to be resolving with use of stool softener. Recommended increased water intake and fiber intake, such as bran, increased fruits and vegetables.   Patient reports blowing "blood clot" out of her right nare. Has had problems with this side of her nose and has seen Dr. Pryor Ochoa at [facility]. Offered referral back to him for further evaluation, patient declines.   Return if symptoms worsen  or fail to improve.   Patient Instructions  Tension Headache A tension headache is a feeling of pain, pressure, or aching that is often felt over the front and sides of the head. The pain can be dull, or it can feel tight (constricting). Tension headaches are not normally associated with nausea or vomiting, and they do not get worse with physical activity. Tension headaches can last from 30 minutes to several days. This is the most common type of headache. CAUSES The exact cause of this condition is not known. Tension headaches often begin after stress, anxiety, or depression. Other triggers may include:  Alcohol.  Too much caffeine, or caffeine withdrawal.  Respiratory infections, such as colds, flu, or sinus infections.  Dental problems or teeth clenching.  Fatigue.  Holding your head and neck in the same position for a long period of time, such as while using a computer.  Smoking. SYMPTOMS Symptoms of this condition include:  A feeling of pressure around the head.  Dull, aching head pain.  Pain felt over the front and sides of the head.  Tenderness in the muscles of the head, neck, and shoulders. DIAGNOSIS This condition may be diagnosed based on your symptoms and a physical exam. Tests may be done, such as a CT scan or an MRI of your head. These tests may be done if your symptoms are severe or unusual. TREATMENT This condition may be treated with lifestyle changes and medicines to help relieve symptoms. HOME CARE INSTRUCTIONS Managing Pain  Take over-the-counter and prescription medicines only as told by your health care provider.  Lie down in a dark, quiet room when you have a headache.  If directed, apply ice to the head and neck area:  Put ice in a plastic bag.  Place a towel between your skin and the bag.  Leave the ice on for 20 minutes, 2-3 times per day.  Use a heating pad or a hot shower to apply heat to the head and neck area as told by your health care  provider. Eating and Drinking  Eat meals on a regular schedule.  Limit alcohol use.  Decrease your caffeine intake, or stop using caffeine. General Instructions  Keep all follow-up visits as told by your health care provider. This is important.  Keep a headache journal to help find out what may trigger your headaches. For example, write down:  What you eat and drink.  How much sleep you get.  Any change  to your diet or medicines.  Try massage or other relaxation techniques.  Limit stress.  Sit up straight, and avoid tensing your muscles.  Do not use tobacco products, including cigarettes, chewing tobacco, or e-cigarettes. If you need help quitting, ask your health care provider.  Exercise regularly as told by your health care provider.  Get 7-9 hours of sleep, or the amount recommended by your health care provider. SEEK MEDICAL CARE IF:  Your symptoms are not helped by medicine.  You have a headache that is different from what you normally experience.  You have nausea or you vomit.  You have a fever. SEEK IMMEDIATE MEDICAL CARE IF:  Your headache becomes severe.  You have repeated vomiting.  You have a stiff neck.  You have a loss of vision.  You have problems with speech.  You have pain in your eye or ear.  You have muscular weakness or loss of muscle control.  You lose your balance or you have trouble walking.  You feel faint or you pass out.  You have confusion.   This information is not intended to replace advice given to you by your health care provider. Make sure you discuss any questions you have with your health care provider.   Document Released: 04/13/2005 Document Revised: 01/02/2015 Document Reviewed: 08/06/2014 Elsevier Interactive Patient Education [employer].    The entirety of the information documented in the History of Present Illness, Review of Systems and Physical Exam were personally obtained by me. Portions of this  information were initially documented by Ashley Royalty, CMA and reviewed by me for thoroughness and accuracy.         Trinna Post, PA-C  [facility]

## 2016-02-25 NOTE — Progress Notes (Signed)
Patient called in after leaving office deciding she wishes to proceed with CT head for persistent, different type headache. Will get CT Head w and w/o contrast.

## 2016-03-03 ENCOUNTER — Ambulatory Visit
Admission: RE | Admit: 2016-03-03 | Discharge: 2016-03-03 | Disposition: A | Discharge status: Home / Self Care | Payer: PPO | Source: Ambulatory Visit | Attending: Physician Assistant | Admitting: Physician Assistant

## 2016-03-03 DIAGNOSIS — R51 Headache: Principal | ICD-10-CM

## 2016-03-03 DIAGNOSIS — R519 Headache, unspecified: Secondary | ICD-10-CM

## 2016-03-03 MED ORDER — IOPAMIDOL (ISOVUE-300) INJECTION 61%: 60.0000 mL | Freq: Once | INTRAVENOUS | Status: DC | PRN

## 2016-03-04 ENCOUNTER — Other Ambulatory Visit: Payer: Self-pay | Admitting: Family Medicine

## 2016-03-04 DIAGNOSIS — E7800 Pure hypercholesterolemia, unspecified: Secondary | ICD-10-CM

## 2016-03-06 ENCOUNTER — Ambulatory Visit (INDEPENDENT_AMBULATORY_CARE_PROVIDER_SITE_OTHER): Payer: PPO | Admitting: Physician Assistant

## 2016-03-06 DIAGNOSIS — Z23 Encounter for immunization: Secondary | ICD-10-CM

## 2016-03-06 NOTE — Progress Notes (Signed)
Given flu and pneumovax.

## 2016-03-07 ENCOUNTER — Ambulatory Visit: Payer: PPO

## 2016-03-09 ENCOUNTER — Other Ambulatory Visit: Payer: Self-pay | Admitting: Family Medicine

## 2016-03-09 DIAGNOSIS — E7800 Pure hypercholesterolemia, unspecified: Secondary | ICD-10-CM

## 2016-03-31 DIAGNOSIS — M5416 Radiculopathy, lumbar region: Secondary | ICD-10-CM | POA: Diagnosis not present

## 2016-03-31 DIAGNOSIS — M5136 Other intervertebral disc degeneration, lumbar region: Secondary | ICD-10-CM | POA: Diagnosis not present

## 2016-03-31 DIAGNOSIS — M48062 Spinal stenosis, lumbar region with neurogenic claudication: Secondary | ICD-10-CM | POA: Diagnosis not present

## 2016-05-04 DIAGNOSIS — H6982 Other specified disorders of Eustachian tube, left ear: Secondary | ICD-10-CM | POA: Diagnosis not present

## 2016-05-04 DIAGNOSIS — J340 Abscess, furuncle and carbuncle of nose: Secondary | ICD-10-CM | POA: Diagnosis not present

## 2016-05-15 ENCOUNTER — Ambulatory Visit (INDEPENDENT_AMBULATORY_CARE_PROVIDER_SITE_OTHER): Payer: PPO | Admitting: Physician Assistant

## 2016-05-15 ENCOUNTER — Encounter: Payer: Self-pay | Admitting: Physician Assistant

## 2016-05-15 VITALS — BP 132/72 | HR 72 | Temp 97.8°F | Resp 16 | Wt 131.0 lb

## 2016-05-15 DIAGNOSIS — M7061 Trochanteric bursitis, right hip: Secondary | ICD-10-CM

## 2016-05-15 DIAGNOSIS — I10 Essential (primary) hypertension: Secondary | ICD-10-CM | POA: Diagnosis not present

## 2016-05-15 MED ORDER — MELOXICAM 7.5 MG PO TABS: 7.5000 mg | ORAL_TABLET | Freq: Every day | ORAL | 5 refills | Status: DC

## 2016-05-15 NOTE — Patient Instructions (Signed)
DASH Eating Plan DASH stands for "Dietary Approaches to Stop Hypertension." The DASH eating plan is a healthy eating plan that has been shown to reduce high blood pressure (hypertension). Additional health benefits may include reducing the risk of type 2 diabetes mellitus, heart disease, and stroke. The DASH eating plan may also help with weight loss. What do I need to know about the DASH eating plan? For the DASH eating plan, you will follow these general guidelines:  Choose foods with less than 150 milligrams of sodium per serving (as listed on the food label).  Use salt-free seasonings or herbs instead of table salt or sea salt.  Check with your health care provider or pharmacist before using salt substitutes.  Eat lower-sodium products. These are often labeled as "low-sodium" or "no salt added."  Eat fresh foods. Avoid eating a lot of canned foods.  Eat more vegetables, fruits, and low-fat dairy products.  Choose whole grains. Look for the word "whole" as the first word in the ingredient list.  Choose fish and skinless chicken or turkey more often than red meat. Limit fish, poultry, and meat to 6 oz (170 g) each day.  Limit sweets, desserts, sugars, and sugary drinks.  Choose heart-healthy fats.  Eat more home-cooked food and less restaurant, buffet, and fast food.  Limit fried foods.  Do not fry foods. Cook foods using methods such as baking, boiling, grilling, and broiling instead.  When eating at a restaurant, ask that your food be prepared with less salt, or no salt if possible. What foods can I eat? Seek help from a dietitian for individual calorie needs. Grains  Whole grain or whole wheat bread. Brown rice. Whole grain or whole wheat pasta. Quinoa, bulgur, and whole grain cereals. Low-sodium cereals. Corn or whole wheat flour tortillas. Whole grain cornbread. Whole grain crackers. Low-sodium crackers. Vegetables  Fresh or frozen vegetables (raw, steamed, roasted, or  grilled). Low-sodium or reduced-sodium tomato and vegetable juices. Low-sodium or reduced-sodium tomato sauce and paste. Low-sodium or reduced-sodium canned vegetables. Fruits  All fresh, canned (in natural juice), or frozen fruits. Meat and Other Protein Products  Ground beef (85% or leaner), grass-fed beef, or beef trimmed of fat. Skinless chicken or turkey. Ground chicken or turkey. Pork trimmed of fat. All fish and seafood. Eggs. Dried beans, peas, or lentils. Unsalted nuts and seeds. Unsalted canned beans. Dairy  Low-fat dairy products, such as skim or 1% milk, 2% or reduced-fat cheeses, low-fat ricotta or cottage cheese, or plain low-fat yogurt. Low-sodium or reduced-sodium cheeses. Fats and Oils  Tub margarines without trans fats. Light or reduced-fat mayonnaise and salad dressings (reduced sodium). Avocado. Safflower, olive, or canola oils. Natural peanut or almond butter. Other  Unsalted popcorn and pretzels. The items listed above may not be a complete list of recommended foods or beverages. Contact your dietitian for more options.  What foods are not recommended? Grains  White bread. White pasta. White rice. Refined cornbread. Bagels and croissants. Crackers that contain trans fat. Vegetables  Creamed or fried vegetables. Vegetables in a cheese sauce. Regular canned vegetables. Regular canned tomato sauce and paste. Regular tomato and vegetable juices. Fruits  Canned fruit in light or heavy syrup. Fruit juice. Meat and Other Protein Products  Fatty cuts of meat. Ribs, chicken wings, bacon, sausage, bologna, salami, chitterlings, fatback, hot dogs, bratwurst, and packaged luncheon meats. Salted nuts and seeds. Canned beans with salt. Dairy  Whole or 2% milk, cream, half-and-half, and cream cheese. Whole-fat or sweetened yogurt. Full-fat cheeses   or blue cheese. Nondairy creamers and whipped toppings. Processed cheese, cheese spreads, or cheese curds. Condiments  Onion and garlic  salt, seasoned salt, table salt, and sea salt. Canned and packaged gravies. Worcestershire sauce. Tartar sauce. Barbecue sauce. Teriyaki sauce. Soy sauce, including reduced sodium. Steak sauce. Fish sauce. Oyster sauce. Cocktail sauce. Horseradish. Ketchup and mustard. Meat flavorings and tenderizers. Bouillon cubes. Hot sauce. Tabasco sauce. Marinades. Taco seasonings. Relishes. Fats and Oils  Butter, stick margarine, lard, shortening, ghee, and bacon fat. Coconut, palm kernel, or palm oils. Regular salad dressings. Other  Pickles and olives. Salted popcorn and pretzels. The items listed above may not be a complete list of foods and beverages to avoid. Contact your dietitian for more information.  Where can I find more information? National Heart, Lung, and Blood Institute: www.nhlbi.nih.gov/health/health-topics/topics/dash/ This information is not intended to replace advice given to you by your health care provider. Make sure you discuss any questions you have with your health care provider. Document Released: 04/02/2011 Document Revised: 09/19/2015 Document Reviewed: 02/15/2013 Elsevier Interactive Patient Education  2017 Elsevier Inc.  

## 2016-05-15 NOTE — Progress Notes (Signed)
Patient: Becky Prince Female    DOB: 1925/09/19   83 y.o.   MRN: [id number] Visit Date: 05/15/2016  Today's Provider: Mar Daring, PA-C   Chief Complaint  Patient presents with  . Hypertension   Subjective:    HPI  Hypertension, follow-up:  BP Readings from Last 3 Encounters:  05/15/16 132/72  02/25/16 114/60  02/12/16 (!) 90/52    She was last seen for hypertension 3 months ago.  BP at that visit was 114/60. Management changes since that visit include decrease Maxzide to 1/2 tablet daily, and pt decreased Metoprolol tartrate 25 mg to 1/4 tablet BID. She reports excellent compliance with treatment. She is not having side effects.  She is not exercising. She is adherent to low salt diet.   Outside blood pressures are stable. She is experiencing none.  Patient denies chest pain and lower extremity edema.   Cardiovascular risk factors include advanced age (older than 63 for men, 44 for women) and hypertension.  Use of agents associated with hypertension: none.     Weight trend: stable Wt Readings from Last 3 Encounters:  05/15/16 131 lb (59.4 kg)  02/25/16 133 lb (60.3 kg)  02/12/16 132 lb (59.9 kg)    Current diet: in general, a "healthy" diet    ------------------------------------------------------------------------  Patient is requesting refill on Meloxicam 7.5 mg. Patient reports she takes one tablet daily.     Allergies  Allergen Reactions  . Naproxen Swelling    Had lip swelling 20 yrs ago - not sure if caused by oxycodone or naproxen  . Oxycodone-Aspirin Swelling    Had lip swelling 20 yrs ago. Not sure if caused by oxycodone or naproxen.     Current Outpatient Prescriptions:  .  aspirin 81 MG tablet, Take 81 mg by mouth daily. , Disp: , Rfl:  .  citalopram (CELEXA) 20 MG tablet, 1 TABLET TABLET, ORAL, DAILY, Disp: 90 tablet, Rfl: 1 .  fluticasone (FLONASE) 50 MCG/ACT nasal spray, INSTILL 2 SPRAYS INTO EACH NOSTRIL DAILY, Disp: 48 g,  Rfl: 1 .  metoprolol tartrate (LOPRESSOR) 25 MG tablet, TAKE 1/2 TABLETS (12.5 MG TOTAL) BY MOUTH 2 (TWO) TIMES DAILY. (Patient taking differently: TAKE 1/4 TABLETS (12.5 MG TOTAL) BY MOUTH 2 (TWO) TIMES DAILY.), Disp: 90 tablet, Rfl: 3 .  Multiple Vitamins-Minerals (MULTIVITAMIN) tablet, Take 1 tablet by mouth daily., Disp: 30 tablet, Rfl: 0 .  Omega-3 Fatty Acids (FISH OIL) 1200 MG CAPS, Take 1 capsule (1,200 mg total) by mouth 2 (two) times daily., Disp: 60 capsule, Rfl: 0 .  simvastatin (ZOCOR) 20 MG tablet, 1 (ONE) TABLET TABLET, ORAL, EVERY EVENING, Disp: 90 tablet, Rfl: 1 .  triamterene-hydrochlorothiazide (MAXZIDE-25) 37.5-25 MG tablet, Take 0.5 tablets by mouth daily., Disp: 45 tablet, Rfl: 3 .  vitamin E 100 UNIT capsule, Take 2 capsules (200 Units total) by mouth daily., Disp: 60 capsule, Rfl: 0 .  meloxicam (MOBIC) 7.5 MG tablet, Take 1 tablet by mouth daily., Disp: , Rfl:  .  traMADol (ULTRAM) 50 MG tablet, Take 1 tablet (50 mg total) by mouth every 6 (six) hours as needed., Disp: 30 tablet, Rfl: 0  Review of Systems  Constitutional: Negative.   Respiratory: Negative.   Cardiovascular: Negative.   Gastrointestinal: Negative.   Endocrine: Negative.   Neurological: Negative.     Social History  Substance Use Topics  . Smoking status: Never Smoker  . Smokeless tobacco: Never Used  . Alcohol use No   Objective:  BP 132/72 (BP Location: Left Arm, Patient Position: Sitting, Cuff Size: Large)   Pulse 72   Temp 97.8 F (36.6 C) (Oral)   Resp 16   Wt 131 lb (59.4 kg)   BMI 20.52 kg/m   Physical Exam  Constitutional: She appears well-developed and well-nourished. No distress.  Neck: Normal range of motion. Neck supple. No tracheal deviation present. No thyromegaly present.  Cardiovascular: Normal rate, regular rhythm and normal heart sounds.  Exam reveals no gallop and no friction rub.   No murmur heard. Pulmonary/Chest: Effort normal and breath sounds normal. No  respiratory distress. She has no wheezes. She has no rales.  Musculoskeletal: She exhibits no edema.  Lymphadenopathy:    She has no cervical adenopathy.  Skin: She is not diaphoretic.  Vitals reviewed.      Assessment & Plan:     1. Essential hypertension Stable. Continue Maxzide 37.5-25, take 1/2 tab daily, metoprolol 25mg , take 1/2 tab daily. I will see her back  In 3 months to recheck and to get all labs at that time.  2. Trochanteric bursitis of right hip Stable. Diagnosis pulled for medication refill. Continue current medical treatment plan. - meloxicam (MOBIC) 7.5 MG tablet; Take 1 tablet (7.5 mg total) by mouth daily.  Dispense: 30 tablet; Refill: [place], PA-C  [facility]

## 2016-05-17 MED ORDER — MELOXICAM 7.5 MG PO TABS: 7.5000 mg | ORAL_TABLET | Freq: Every day | ORAL | 5 refills | Status: DC

## 2016-06-09 DIAGNOSIS — H40003 Preglaucoma, unspecified, bilateral: Secondary | ICD-10-CM | POA: Diagnosis not present

## 2016-06-13 ENCOUNTER — Other Ambulatory Visit: Payer: Self-pay | Admitting: Physician Assistant

## 2016-06-13 DIAGNOSIS — F4329 Adjustment disorder with other symptoms: Secondary | ICD-10-CM

## 2016-07-13 ENCOUNTER — Other Ambulatory Visit: Payer: Self-pay

## 2016-07-13 DIAGNOSIS — M7061 Trochanteric bursitis, right hip: Secondary | ICD-10-CM

## 2016-07-13 MED ORDER — MELOXICAM 7.5 MG PO TABS: 7.5000 mg | ORAL_TABLET | Freq: Every day | ORAL | 1 refills | Status: DC

## 2016-07-13 NOTE — Telephone Encounter (Signed)
Pharmacy requesting 90 day refill Last ov 05/15/16 Last filled  Please review. Thank you. sd

## 2016-07-17 ENCOUNTER — Other Ambulatory Visit: Payer: Self-pay

## 2016-07-17 ENCOUNTER — Other Ambulatory Visit: Payer: Self-pay | Admitting: Physician Assistant

## 2016-07-17 DIAGNOSIS — E7800 Pure hypercholesterolemia, unspecified: Secondary | ICD-10-CM

## 2016-07-17 DIAGNOSIS — I10 Essential (primary) hypertension: Secondary | ICD-10-CM

## 2016-07-17 MED ORDER — TRIAMTERENE-HCTZ 37.5-25 MG PO TABS: 0.5000 | ORAL_TABLET | Freq: Every day | ORAL | 3 refills | Status: DC

## 2016-07-17 NOTE — Telephone Encounter (Signed)
Request for a refill on  Triamterene-HCTZ 37.5-25MG  TB Qty:90 R:3  [facility] [place],  -Becky Prince

## 2016-07-17 NOTE — Telephone Encounter (Signed)
LOV 05/15/2016. Last refill 03/10/2016. Renaldo Fiddler, CMA

## 2016-07-18 MED ORDER — TRIAMTERENE-HCTZ 37.5-25 MG PO TABS: 0.5000 | ORAL_TABLET | Freq: Every day | ORAL | 3 refills | Status: DC

## 2016-07-18 NOTE — Addendum Note (Signed)
Addended by: Mar Daring on: 07/18/2016 10:53 AM   Modules accepted: Orders

## 2016-07-22 DIAGNOSIS — M48062 Spinal stenosis, lumbar region with neurogenic claudication: Secondary | ICD-10-CM | POA: Diagnosis not present

## 2016-07-22 DIAGNOSIS — M5416 Radiculopathy, lumbar region: Secondary | ICD-10-CM | POA: Diagnosis not present

## 2016-07-22 DIAGNOSIS — M5136 Other intervertebral disc degeneration, lumbar region: Secondary | ICD-10-CM | POA: Diagnosis not present

## 2016-07-28 ENCOUNTER — Telehealth: Payer: Self-pay | Admitting: Physician Assistant

## 2016-07-28 NOTE — Telephone Encounter (Signed)
Pt called saying she needs PA for simvastatin (ZOCOR) 20 MG tablet  She said she got the other two refills she requested but they said the simvastatin was not approved.  [facility]

## 2016-07-28 NOTE — Telephone Encounter (Signed)
PA pending.Did it today.  Thanks,  -Joseline

## 2016-08-14 ENCOUNTER — Ambulatory Visit (INDEPENDENT_AMBULATORY_CARE_PROVIDER_SITE_OTHER): Payer: PPO | Admitting: Physician Assistant

## 2016-08-14 ENCOUNTER — Encounter: Payer: Self-pay | Admitting: Physician Assistant

## 2016-08-14 VITALS — BP 130/80 | HR 72 | Temp 97.9°F | Resp 16 | Wt 127.0 lb

## 2016-08-14 DIAGNOSIS — I10 Essential (primary) hypertension: Secondary | ICD-10-CM

## 2016-08-14 DIAGNOSIS — R634 Abnormal weight loss: Secondary | ICD-10-CM

## 2016-08-14 DIAGNOSIS — E0590 Thyrotoxicosis, unspecified without thyrotoxic crisis or storm: Secondary | ICD-10-CM | POA: Diagnosis not present

## 2016-08-14 NOTE — Patient Instructions (Signed)
High-Protein and High-Calorie Diet Eating high-protein and high-calorie foods can help you to gain weight, heal after an injury, and recover after an illness or surgery. What is my plan? The specific amount of daily protein and calories you need depends on:  Your body weight.  The reason this diet is recommended for you. Generally, a high-protein, high-calorie diet involves:  Eating 250-500 extra calories each day.  Making sure that 10-35% of your daily calories come from protein. Talk to your health care provider about how much protein and how many calories you need each day. Follow the diet as directed by your health care provider. What do I need to know about this diet?  Ask your health care provider if you should take a nutritional supplement.  Try to eat six small meals each day instead of three large meals.  Eat a balanced diet, including one food that is high in protein at each meal.  Keep nutritious snacks handy, such as nuts, trail mixes, dried fruit, and yogurt.  If you have kidney disease or diabetes, eating too much protein may put extra stress on your kidneys. Talk to your health care provider if you have either of those conditions. What are some high-protein foods? Grains  Quinoa. Bulgur wheat. Vegetables  Soybeans. Peas. Meats and Other Protein Sources  Beef, pork, and poultry. Fish and seafood. Eggs. Tofu. Textured vegetable protein (TVP). Peanut butter. Nuts and seeds. Dried beans. Protein powders. Dairy  Whole milk. Whole-milk yogurt. Powdered milk. Cheese. [employer]. Eggnog. Beverages  High-protein supplement drinks. Soy milk. Other  Protein bars. The items listed above may not be a complete list of recommended foods or beverages. Contact your dietitian for more options.  What are some high-calorie foods? Grains  Pasta. Quick breads. Muffins. Pancakes. Ready-to-eat cereal. Vegetables  Vegetables cooked in oil or butter. Fried potatoes. Fruits   Dried fruit. Fruit leather. Canned fruit in syrup. Fruit juice. Avocados. Meats and Other Protein Sources  Peanut butter. Nuts and seeds. Dairy  Heavy cream. Whipped cream. Cream cheese. Sour cream. Ice cream. Custard. Pudding. Beverages  Meal-replacement beverages. Nutrition shakes. Fruit juice. Sugar-sweetened soft drinks. Condiments  Salad dressing. Mayonnaise. Alfredo sauce. Fruit preserves or jelly. Honey. Syrup. Sweets/Desserts  Cake. Cookies. Pie. Pastries. Candy bars. Chocolate. Fats and Oils  Butter or margarine. Oil. Gravy. Other  Meal-replacement bars. The items listed above may not be a complete list of recommended foods or beverages. Contact your dietitian for more options.  What are some tips for including high-protein and high-calorie foods in my diet?  Add whole milk, half-and-half, or heavy cream to cereal, pudding, soup, or hot cocoa.  Add whole milk to instant breakfast drinks.  Add peanut butter to oatmeal or smoothies.  Add powdered milk to baked goods, smoothies, or milkshakes.  Add powdered milk, cream, or butter to mashed potatoes.  Add cheese to cooked vegetables.  Make whole-milk yogurt parfaits. Top them with granola, fruit, or nuts.  Add cottage cheese to your fruit.  Add avocados, cheese, or both to sandwiches or salads.  Add meat, poultry, or seafood to rice, pasta, casseroles, salads, and soups.  Use mayonnaise when making egg salad, chicken salad, or tuna salad.  Use peanut butter as a topping for pretzels, celery, or crackers.  Add beans to casseroles, dips, and spreads.  Add pureed beans to sauces and soups.  Replace calorie-free drinks with calorie-containing drinks, such as milk and fruit juice. This information is not intended to replace advice given to you  by your health care provider. Make sure you discuss any questions you have with your health care provider. Document Released: 04/13/2005 Document Revised: 09/19/2015 Document  Reviewed: 09/26/2013 Elsevier Interactive Patient Education  2017 [employer].

## 2016-08-14 NOTE — Progress Notes (Signed)
Patient: Becky Prince Female    DOB: July 03, 1925   83 y.o.   MRN: [id number] Visit Date: 08/14/2016  Today's Provider: Mar Daring, PA-C   Chief Complaint  Patient presents with  . Follow-up    Hypertension   Subjective:    HPI  Hypertension, follow-up:  BP Readings from Last 3 Encounters:  08/14/16 130/80  05/15/16 132/72  02/25/16 114/60    She was last seen for hypertension 3 months ago.  BP at that visit was 132/72. Management since that visit includes none. She reports excellent compliance with treatment. She is not having side effects.  She is not exercising. She is adherent to low salt diet.   Outside blood pressures are stable. She is experiencing fatigue.  Patient denies chest pain, chest pressure/discomfort, exertional chest pressure/discomfort, irregular heart beat, lower extremity edema, near-syncope and palpitations.   Cardiovascular risk factors include advanced age (older than 74 for men, 55 for women), dyslipidemia and hypertension.  Use of agents associated with hypertension: none.     Weight trend: stable Wt Readings from Last 3 Encounters:  08/14/16 127 lb (57.6 kg)  05/15/16 131 lb (59.4 kg)  02/25/16 133 lb (60.3 kg)    Current diet: in general, a "healthy" diet    ------------------------------------------------------------------------     Allergies  Allergen Reactions  . Naproxen Swelling    Had lip swelling 20 yrs ago - not sure if caused by oxycodone or naproxen  . Oxycodone-Aspirin Swelling    Had lip swelling 20 yrs ago. Not sure if caused by oxycodone or naproxen.     Current Outpatient Prescriptions:  .  aspirin 81 MG tablet, Take 81 mg by mouth daily. , Disp: , Rfl:  .  citalopram (CELEXA) 20 MG tablet, TAKE ONE TABLET BY MOUTH ONCE DAILY, Disp: 90 tablet, Rfl: 1 .  fluticasone (FLONASE) 50 MCG/ACT nasal spray, INSTILL 2 SPRAYS INTO EACH NOSTRIL DAILY, Disp: 48 g, Rfl: 1 .  meloxicam (MOBIC) 7.5 MG tablet,  Take 1 tablet (7.5 mg total) by mouth daily., Disp: 90 tablet, Rfl: 1 .  metoprolol tartrate (LOPRESSOR) 25 MG tablet, TAKE 1/2 TABLETS (12.5 MG TOTAL) BY MOUTH 2 (TWO) TIMES DAILY. (Patient taking differently: TAKE 1/4 TABLETS (12.5 MG TOTAL) BY MOUTH 2 (TWO) TIMES DAILY.), Disp: 90 tablet, Rfl: 3 .  Multiple Vitamins-Minerals (MULTIVITAMIN) tablet, Take 1 tablet by mouth daily., Disp: 30 tablet, Rfl: 0 .  simvastatin (ZOCOR) 20 MG tablet, TAKE 1 TABLET BY MOUTH EVERY EVENING, Disp: 90 tablet, Rfl: 1 .  traMADol (ULTRAM) 50 MG tablet, Take 1 tablet (50 mg total) by mouth every 6 (six) hours as needed., Disp: 30 tablet, Rfl: 0 .  triamterene-hydrochlorothiazide (MAXZIDE-25) 37.5-25 MG tablet, Take 0.5 tablets by mouth daily., Disp: 45 tablet, Rfl: 3 .  vitamin E 100 UNIT capsule, Take 2 capsules (200 Units total) by mouth daily., Disp: 60 capsule, Rfl: 0 .  Omega-3 Fatty Acids (FISH OIL) 1200 MG CAPS, Take 1 capsule (1,200 mg total) by mouth 2 (two) times daily., Disp: 60 capsule, Rfl: 0  Review of Systems  Constitutional: Positive for fatigue.  Respiratory: Negative for cough, chest tightness, shortness of breath and wheezing.   Cardiovascular: Negative for chest pain, palpitations and leg swelling.  Gastrointestinal: Negative for abdominal pain, constipation, diarrhea, nausea and vomiting.  Neurological: Positive for light-headedness (sometimes). Negative for dizziness and headaches.    Social History  Substance Use Topics  . Smoking status: Never Smoker  .  Smokeless tobacco: Never Used  . Alcohol use No   Objective:   BP 130/80 (BP Location: Right Arm, Patient Position: Sitting, Cuff Size: Normal)   Pulse 72   Temp 97.9 F (36.6 C) (Oral)   Resp 16   Wt 127 lb (57.6 kg)   SpO2 96%   BMI 19.89 kg/m    Physical Exam  Constitutional: She appears well-developed and well-nourished. No distress.  Neck: Normal range of motion. Neck supple.  Cardiovascular: Normal rate, regular  rhythm, normal heart sounds and intact distal pulses.  Exam reveals no gallop and no friction rub.   No murmur heard. Pulmonary/Chest: Effort normal and breath sounds normal. No respiratory distress. She has no wheezes. She has no rales.  Musculoskeletal: She exhibits no edema.  Skin: She is not diaphoretic.  Vitals reviewed.     Assessment & Plan:     1. Essential hypertension Stable. Continue maxzide 37.5-25 taking 1/2 tab daily. Will discontinue metoprolol. Will check labs as below and f/u pending results. I will see her back in 6 months for AWV/CPE or sooner if needed.  - CBC w/Diff/Platelet - Comprehensive Metabolic Panel (CMET)  2. Subclinical hyperthyroidism Stable but having increased fatigue. Will check labs as below and f/u pending results. - Comprehensive Metabolic Panel (CMET) - TSH - T4  3. Weight loss Suspect malnourishment and decreased protein. Advised patient to add Ensure Enlive or Ensure High Protein supplementation, adding 1-2 bottles daily between meals. She voiced understanding. She is to call if she continues to lose weight per her scale at home. Discussed adding remeron for increasing appetite if needed.  - CBC w/Diff/Platelet - Comprehensive Metabolic Panel (CMET)       Mar Daring, PA-C  [facility]

## 2016-08-15 LAB — COMPREHENSIVE METABOLIC PANEL WITH GFR
ALT: 9 IU/L (ref 0–32)
AST: 18 IU/L (ref 0–40)
Albumin/Globulin Ratio: 1.6 (ref 1.2–2.2)
Albumin: 4.4 g/dL (ref 3.5–4.7)
Alkaline Phosphatase: 87 IU/L (ref 39–117)
BUN/Creatinine Ratio: 18 (ref 12–28)
BUN: 20 mg/dL (ref 8–27)
Bilirubin Total: 0.5 mg/dL (ref 0.0–1.2)
CO2: 27 mmol/L (ref 18–29)
Calcium: 10.1 mg/dL (ref 8.7–10.3)
Chloride: 92 mmol/L — ABNORMAL LOW (ref 96–106)
Creatinine, Ser: 1.1 mg/dL — ABNORMAL HIGH (ref 0.57–1.00)
GFR calc Af Amer: 52 mL/min/1.73 — ABNORMAL LOW
GFR calc non Af Amer: 45 mL/min/1.73 — ABNORMAL LOW
Globulin, Total: 2.7 g/dL (ref 1.5–4.5)
Glucose: 92 mg/dL (ref 65–99)
Potassium: 4.7 mmol/L (ref 3.5–5.2)
Sodium: 134 mmol/L (ref 134–144)
Total Protein: 7.1 g/dL (ref 6.0–8.5)

## 2016-08-15 LAB — CBC WITH DIFFERENTIAL/PLATELET
Basophils Absolute: 0 x10E3/uL (ref 0.0–0.2)
Basos: 0 %
EOS (ABSOLUTE): 0.1 x10E3/uL (ref 0.0–0.4)
Eos: 2 %
Hematocrit: 38.9 % (ref 34.0–46.6)
Hemoglobin: 13.2 g/dL (ref 11.1–15.9)
Immature Grans (Abs): 0 x10E3/uL (ref 0.0–0.1)
Immature Granulocytes: 0 %
Lymphocytes Absolute: 2.1 x10E3/uL (ref 0.7–3.1)
Lymphs: 34 %
MCH: 31.3 pg (ref 26.6–33.0)
MCHC: 33.9 g/dL (ref 31.5–35.7)
MCV: 92 fL (ref 79–97)
Monocytes Absolute: 0.6 x10E3/uL (ref 0.1–0.9)
Monocytes: 9 %
Neutrophils Absolute: 3.4 x10E3/uL (ref 1.4–7.0)
Neutrophils: 55 %
Platelets: 258 x10E3/uL (ref 150–379)
RBC: 4.22 x10E6/uL (ref 3.77–5.28)
RDW: 13.2 % (ref 12.3–15.4)
WBC: 6.2 x10E3/uL (ref 3.4–10.8)

## 2016-08-15 LAB — TSH: TSH: 0.456 u[IU]/mL (ref 0.450–4.500)

## 2016-08-15 LAB — T4: T4, Total: 8.2 ug/dL (ref 4.5–12.0)

## 2016-08-17 NOTE — Progress Notes (Signed)
Advised  ED 

## 2016-09-01 ENCOUNTER — Telehealth: Payer: Self-pay | Admitting: Physician Assistant

## 2016-09-01 DIAGNOSIS — J301 Allergic rhinitis due to pollen: Secondary | ICD-10-CM

## 2016-09-01 MED ORDER — FLUTICASONE PROPIONATE 50 MCG/ACT NA SUSP: NASAL | 1 refills | Status: DC

## 2016-09-01 NOTE — Telephone Encounter (Signed)
[facility]on[facility] refill sent to [facility]

## 2016-09-01 NOTE — Telephone Encounter (Signed)
[facility] following [facility] Thanks CC  fluticasone (FLONASE) 50 MCG/ACT nasal spray  Instill 2 sprays into each nostril daily.

## 2016-09-01 NOTE — Telephone Encounter (Signed)
Last filled 07/10/15 please review. KW

## 2016-09-18 DIAGNOSIS — M5136 Other intervertebral disc degeneration, lumbar region: Secondary | ICD-10-CM | POA: Diagnosis not present

## 2016-09-18 DIAGNOSIS — M5416 Radiculopathy, lumbar region: Secondary | ICD-10-CM | POA: Diagnosis not present

## 2016-09-18 DIAGNOSIS — M48062 Spinal stenosis, lumbar region with neurogenic claudication: Secondary | ICD-10-CM | POA: Diagnosis not present

## 2016-09-18 DIAGNOSIS — M62830 Muscle spasm of back: Secondary | ICD-10-CM | POA: Diagnosis not present

## 2016-09-28 ENCOUNTER — Observation Stay
Admission: EM | Admit: 2016-09-28 | Discharge: 2016-09-29 | Disposition: A | Discharge status: Home / Self Care | Payer: PPO | Attending: Internal Medicine | Admitting: Internal Medicine

## 2016-09-28 ENCOUNTER — Observation Stay: Payer: PPO

## 2016-09-28 ENCOUNTER — Other Ambulatory Visit: Payer: Self-pay

## 2016-09-28 ENCOUNTER — Emergency Department: Payer: PPO

## 2016-09-28 ENCOUNTER — Observation Stay (HOSPITAL_BASED_OUTPATIENT_CLINIC_OR_DEPARTMENT_OTHER)
Admit: 2016-09-28 | Discharge: 2016-09-28 | Disposition: A | Discharge status: Home / Self Care | Payer: PPO | Attending: Internal Medicine | Admitting: Internal Medicine

## 2016-09-28 DIAGNOSIS — Z79899 Other long term (current) drug therapy: Secondary | ICD-10-CM | POA: Insufficient documentation

## 2016-09-28 DIAGNOSIS — E871 Hypo-osmolality and hyponatremia: Secondary | ICD-10-CM | POA: Diagnosis not present

## 2016-09-28 DIAGNOSIS — E785 Hyperlipidemia, unspecified: Secondary | ICD-10-CM | POA: Diagnosis not present

## 2016-09-28 DIAGNOSIS — R29818 Other symptoms and signs involving the nervous system: Secondary | ICD-10-CM | POA: Diagnosis not present

## 2016-09-28 DIAGNOSIS — I071 Rheumatic tricuspid insufficiency: Secondary | ICD-10-CM | POA: Diagnosis not present

## 2016-09-28 DIAGNOSIS — E7800 Pure hypercholesterolemia, unspecified: Secondary | ICD-10-CM | POA: Insufficient documentation

## 2016-09-28 DIAGNOSIS — I272 Pulmonary hypertension, unspecified: Secondary | ICD-10-CM | POA: Insufficient documentation

## 2016-09-28 DIAGNOSIS — I639 Cerebral infarction, unspecified: Secondary | ICD-10-CM | POA: Diagnosis not present

## 2016-09-28 DIAGNOSIS — F329 Major depressive disorder, single episode, unspecified: Secondary | ICD-10-CM | POA: Diagnosis not present

## 2016-09-28 DIAGNOSIS — I10 Essential (primary) hypertension: Secondary | ICD-10-CM | POA: Diagnosis not present

## 2016-09-28 DIAGNOSIS — G459 Transient cerebral ischemic attack, unspecified: Secondary | ICD-10-CM

## 2016-09-28 DIAGNOSIS — Z7982 Long term (current) use of aspirin: Secondary | ICD-10-CM | POA: Diagnosis not present

## 2016-09-28 LAB — LIPID PANEL
Cholesterol: 176 mg/dL (ref 0–200)
HDL: 68 mg/dL (ref >40)
LDL CALC: 82 mg/dL (ref 0–99)
Total CHOL/HDL Ratio: 2.6 RATIO
Triglycerides: 131 mg/dL (ref <150)
VLDL: 26 mg/dL (ref 0–40)

## 2016-09-28 LAB — APTT: APTT: 31 s (ref 24–36)

## 2016-09-28 LAB — COMPREHENSIVE METABOLIC PANEL
ALT: 12 U/L — ABNORMAL LOW (ref 14–54)
AST: 19 U/L (ref 15–41)
Albumin: 4.7 g/dL (ref 3.5–5.0)
Alkaline Phosphatase: 98 U/L (ref 38–126)
Anion gap: 10 (ref 5–15)
BUN: 24 mg/dL — ABNORMAL HIGH (ref 6–20)
CO2: 28 mmol/L (ref 22–32)
Calcium: 10 mg/dL (ref 8.9–10.3)
Chloride: 91 mmol/L — ABNORMAL LOW (ref 101–111)
Creatinine, Ser: 1.18 mg/dL — ABNORMAL HIGH (ref 0.44–1.00)
GFR calc Af Amer: 46 mL/min — ABNORMAL LOW (ref >60)
GFR calc non Af Amer: 40 mL/min — ABNORMAL LOW (ref >60)
Glucose, Bld: 101 mg/dL — ABNORMAL HIGH (ref 65–99)
Potassium: 4.2 mmol/L (ref 3.5–5.1)
Sodium: 129 mmol/L — ABNORMAL LOW (ref 135–145)
Total Bilirubin: 0.9 mg/dL (ref 0.3–1.2)
Total Protein: 8.1 g/dL (ref 6.5–8.1)

## 2016-09-28 LAB — DIFFERENTIAL
Basophils Absolute: 0 10*3/uL (ref 0–0.1)
Basophils Relative: 0 %
Eosinophils Absolute: 0.1 10*3/uL (ref 0–0.7)
Eosinophils Relative: 1 %
Lymphocytes Relative: 26 %
Lymphs Abs: 2.3 10*3/uL (ref 1.0–3.6)
Monocytes Absolute: 0.7 10*3/uL (ref 0.2–0.9)
Monocytes Relative: 8 %
Neutro Abs: 5.6 10*3/uL (ref 1.4–6.5)
Neutrophils Relative %: 65 %

## 2016-09-28 LAB — TROPONIN I: Troponin I: <0.03 ng/mL (ref <0.03)

## 2016-09-28 LAB — CBC
HEMATOCRIT: 41.6 % (ref 35.0–47.0)
Hemoglobin: 14.4 g/dL (ref 12.0–16.0)
MCH: 32.3 pg (ref 26.0–34.0)
MCHC: 34.7 g/dL (ref 32.0–36.0)
MCV: 93.2 fL (ref 80.0–100.0)
Platelets: 263 10*3/uL (ref 150–440)
RBC: 4.47 MIL/uL (ref 3.80–5.20)
RDW: 12.8 % (ref 11.5–14.5)
WBC: 8.7 10*3/uL (ref 3.6–11.0)

## 2016-09-28 LAB — PROTIME-INR
INR: 0.97
Prothrombin Time: 12.9 seconds (ref 11.4–15.2)

## 2016-09-28 LAB — GLUCOSE, CAPILLARY: Glucose-Capillary: 87 mg/dL (ref 65–99)

## 2016-09-28 MED ORDER — FLUTICASONE PROPIONATE 50 MCG/ACT NA SUSP
2.0000 | Freq: Every day | NASAL | Status: DC
  Administered 2016-09-28: 2 via NASAL
  Filled 2016-09-28: qty 16

## 2016-09-28 MED ORDER — CITALOPRAM HYDROBROMIDE 20 MG PO TABS
20.0000 mg | ORAL_TABLET | Freq: Every day | ORAL | Status: DC
  Administered 2016-09-28 – 2016-09-29 (×2): 20 mg via ORAL
  Filled 2016-09-28 (×2): qty 1

## 2016-09-28 MED ORDER — MELOXICAM 7.5 MG PO TABS
7.5000 mg | ORAL_TABLET | Freq: Every day | ORAL | Status: DC
  Administered 2016-09-28 – 2016-09-29 (×2): 7.5 mg via ORAL
  Filled 2016-09-28 (×2): qty 1

## 2016-09-28 MED ORDER — ATORVASTATIN CALCIUM 20 MG PO TABS
40.0000 mg | ORAL_TABLET | Freq: Every day | ORAL | Status: DC
  Administered 2016-09-28: 40 mg via ORAL
  Filled 2016-09-28: qty 2

## 2016-09-28 MED ORDER — HEPARIN SODIUM (PORCINE) 5000 UNIT/ML IJ SOLN
5000.0000 [IU] | Freq: Three times a day (TID) | INTRAMUSCULAR | Status: DC
  Administered 2016-09-28 – 2016-09-29 (×2): 5000 [IU] via SUBCUTANEOUS
  Filled 2016-09-28 (×2): qty 1

## 2016-09-28 MED ORDER — OMEGA-3-ACID ETHYL ESTERS 1 G PO CAPS
1.0000 g | ORAL_CAPSULE | Freq: Every day | ORAL | Status: DC
  Administered 2016-09-29: 09:00:00 1 g via ORAL
  Filled 2016-09-28: qty 1

## 2016-09-28 MED ORDER — ASPIRIN EC 81 MG PO TBEC
81.0000 mg | DELAYED_RELEASE_TABLET | Freq: Every day | ORAL | Status: DC
  Administered 2016-09-28 – 2016-09-29 (×2): 81 mg via ORAL
  Filled 2016-09-28 (×2): qty 1

## 2016-09-28 MED ORDER — TRIAMTERENE-HCTZ 37.5-25 MG PO TABS
1.0000 | ORAL_TABLET | Freq: Every day | ORAL | Status: DC
  Administered 2016-09-28 – 2016-09-29 (×2): 1 via ORAL
  Filled 2016-09-28 (×2): qty 1

## 2016-09-28 MED ORDER — DOCUSATE SODIUM 100 MG PO CAPS: 100.0000 mg | ORAL_CAPSULE | Freq: Two times a day (BID) | ORAL | Status: DC | PRN

## 2016-09-28 MED ORDER — TRAMADOL HCL 50 MG PO TABS
50.0000 mg | ORAL_TABLET | Freq: Four times a day (QID) | ORAL | Status: DC | PRN
  Administered 2016-09-28: 50 mg via ORAL
  Filled 2016-09-28: qty 1

## 2016-09-28 NOTE — ED Provider Notes (Signed)
[facility] Emergency Department Provider Note  Time seen: 1:09 PM  I have reviewed the triage vital signs and the nursing notes.   HISTORY  Chief Complaint Aphasia    HPI Becky Prince is a 83 y.o. female with a past medical history of hypertension, hyperlipidemia, presents to the emergency department difficulty speaking. According to the patient and her daughter around 11:00 this morning the patient developed slurred speech and difficulty with word finding. The patient states this has happened once before, but she did not tell anybody about it. Denies any focal weakness or numbness. Denies any confusion or headache. Upon arrival to the emergency department the patient presented as a code stroke. Her symptoms have resolved, she has no deficits at this time. Overall negative review of systems.  Past Medical History:  Diagnosis Date  . Depression   . Environmental allergies   . Hypercholesteremia   . Hypertension     Patient Active Problem List   Diagnosis Date Noted  . Osteoporosis 10/14/2015  . Hip bursitis 10/14/2015  . Adaptation reaction 04/12/2015  . CN (constipation) 04/12/2015  . Contracture of finger joint 04/12/2015  . D (diarrhea) 04/12/2015  . Fatigue 04/12/2015  . Hypercholesteremia 04/12/2015  . BP (high blood pressure) 04/12/2015  . Allergic rhinitis, seasonal 04/12/2015  . Body tinea 04/12/2015  . Subclinical hyperthyroidism 11/16/2014    Past Surgical History:  Procedure Laterality Date  . ABDOMINAL HYSTERECTOMY    . CATARACT EXTRACTION W/PHACO Right 06/12/2015   Procedure: CATARACT EXTRACTION PHACO AND INTRAOCULAR LENS PLACEMENT (IOC);  Surgeon: Leandrew Koyanagi, MD;  Location: [place];  Service: Ophthalmology;  Laterality: Right;    Prior to Admission medications   Medication Sig Start Date End Date Taking? Authorizing Provider  aspirin 81 MG tablet Take 81 mg by mouth daily.     [provider]   citalopram (CELEXA) 20 MG tablet TAKE ONE TABLET BY MOUTH ONCE DAILY 06/13/16   Mar Daring, PA-C  fluticasone [facility]) 50 MCG/ACT nasal spray INSTILL 2 SPRAYS INTO EACH NOSTRIL DAILY 09/01/16   Mar Daring, PA-C  meloxicam (MOBIC) 7.5 MG tablet Take 1 tablet (7.5 mg total) by mouth daily. 07/13/16   Mar Daring, PA-C  Multiple Vitamins-Minerals (MULTIVITAMIN) tablet Take 1 tablet by mouth daily. 12/02/15   Mar Daring, PA-C  Omega-3 Fatty Acids (FISH OIL) 1200 MG CAPS Take 1 capsule (1,200 mg total) by mouth 2 (two) times daily. 12/02/15   Mar Daring, PA-C  simvastatin (ZOCOR) 20 MG tablet TAKE 1 TABLET BY MOUTH EVERY EVENING 07/17/16   Mar Daring, PA-C  traMADol (ULTRAM) 50 MG tablet Take 1 tablet (50 mg total) by mouth every 6 (six) hours as needed. 12/02/15   Mar Daring, PA-C  triamterene-hydrochlorothiazide (MAXZIDE-25) 37.5-25 MG tablet Take 0.5 tablets by mouth daily. 07/18/16   Mar Daring, PA-C  vitamin E 100 UNIT capsule Take 2 capsules (200 Units total) by mouth daily. 12/02/15   Mar Daring, PA-C    Allergies  Allergen Reactions  . Naproxen Swelling    Had lip swelling 20 yrs ago - not sure if caused by oxycodone or naproxen  . Oxycodone-Aspirin Swelling    Had lip swelling 20 yrs ago. Not sure if caused by oxycodone or naproxen.    Family History  Problem Relation Age of Onset  . Heart disease Mother        CHF  . Hypertension Mother   . Cancer  Father        prostate  . Cancer Sister        breast  . Kidney disease Brother   . Breast cancer Neg Hx     Social History Social History  Substance Use Topics  . Smoking status: Never Smoker  . Smokeless tobacco: Never Used  . Alcohol use No    Review of Systems Constitutional: Negative for fever. Eyes: Negative for visual changes. ENT: Negative for congestion Cardiovascular: Negative for chest pain. Respiratory: Negative for shortness of  breath. Gastrointestinal: Negative for abdominal pain, vomiting and diarrhea. Genitourinary: Negative for dysuria. Musculoskeletal: Negative for back pain. Skin: Negative for rash. Neurological: Negative for headaches, focal weakness or numbness.Slurred speech and difficulty with word finding this morning, now resolved. All other ROS negative  ____________________________________________   PHYSICAL EXAM:  VITAL SIGNS: ED Triage Vitals  Enc Vitals Group     BP 09/28/16 1226 (!) 186/106     Pulse Rate 09/28/16 1226 91     Resp 09/28/16 1226 18     Temp 09/28/16 1247 97.7 F (36.5 C)     Temp Source 09/28/16 1247 Oral     SpO2 09/28/16 1226 95 %     Weight 09/28/16 1227 130 lb (59 kg)     Height --      Head Circumference --      Peak Flow --      Pain Score --      Pain Loc --      Pain Edu? --      Excl. in [place]? --     Constitutional: Alert and oriented. Well appearing and in no distress. Eyes: Normal exam ENT   Head: Normocephalic and atraumatic.   Mouth/Throat: Mucous membranes are moist. Cardiovascular: Normal rate, regular rhythm. No murmur Respiratory: Normal respiratory effort without tachypnea nor retractions. Breath sounds are clear  Gastrointestinal: Soft and nontender. No distention.   Musculoskeletal: Nontender with normal range of motion in all extremities. Neurologic:  Normal speech and language. No gross focal neurologic deficits  Skin:  Skin is warm, dry and intact.  Psychiatric: Mood and affect are normal.   ____________________________________________    EKG  EKG reviewed and interpreted by myself shows normal sinus rhythm 82 bpm, narrow QS, normal axis, normal intervals, no concerning ST changes. Overall reassuring EKG.  ____________________________________________    RADIOLOGY  CT head negative  ____________________________________________   INITIAL IMPRESSION / ASSESSMENT AND PLAN / ED COURSE  Pertinent labs & imaging results  that were available during my care of the patient were reviewed by me and considered in my medical decision making (see chart for details).  Patient presents to the [place] with difficulty speaking which began at 11:00 this morning. He states symptoms resolved before arrival to the emergency department. She states symptoms have occurred once before, resolved on their own and she did not seek medical attention. Overall the patient appears very well at this time, no distress. Normal neurological exam. Patient has been seen by neurology who recommends admission for TIA workup. We will dose aspirin in the emergency department. CT head is negative. Patient is within the window for TPA however does not meet criteria for TPA as her symptoms have resolved.   NIH Stroke Scale   Interval: Baseline Time: 1:12 PM Person Administering Scale: Maxey Ransom  Administer stroke scale items in the order listed. Record performance in each category after each subscale exam. Do not go back and change scores. Follow  directions provided for each exam technique. Scores should reflect what the patient does, not what the clinician thinks the patient can do. The clinician should record answers while administering the exam and work quickly. Except where indicated, the patient should not be coached (i.e., repeated requests to patient to make a special effort).   1a  Level of consciousness: 0=alert; keenly responsive  1b. LOC questions:  0=Performs both tasks correctly  1c. LOC commands: 0=Performs both tasks correctly  2.  Best Gaze: 0=normal  3.  Visual: 0=No visual loss  4. Facial Palsy: 0=Normal symmetric movement  5a.  Motor left arm: 0=No drift, limb holds 90 (or 45) degrees for full 10 seconds  5b.  Motor right arm: 0=No drift, limb holds 90 (or 45) degrees for full 10 seconds  6a. motor left leg: 0=No drift, limb holds 90 (or 45) degrees for full 10 seconds  6b  Motor right leg:  0=No drift, limb  holds 90 (or 45) degrees for full 10 seconds  7. Limb Ataxia: 0=Absent  8.  Sensory: 0=Normal; no sensory loss  9. Best Language:  0=No aphasia, normal  10. Dysarthria: 0=Normal  11. Extinction and Inattention: 0=No abnormality  12. Distal motor function: 0=Normal   Total:   0    ____________________________________________   FINAL CLINICAL IMPRESSION(S) / ED DIAGNOSES  Transient ischemic attack    Harvest Dark, MD 09/28/16 1312

## 2016-09-28 NOTE — Progress Notes (Signed)
Chaplain received a page code stroke. Spent time ministering to family. Provided the ministry of prayer and a pastoral presence.    09/28/16 1230  Clinical Encounter Type  Visited With Patient and family together  Visit Type Initial;Spiritual support  Referral From Nurse  Consult/Referral To Chaplain  Spiritual Encounters  Spiritual Needs Prayer

## 2016-09-28 NOTE — Progress Notes (Signed)
Family Meeting Note  Advance Directive:yes  Today a meeting took place with the Patient, spouse and daughter.  The following clinical team members were present during this meeting:MD  The following were discussed:Patient's diagnosis: TIA, Hypertension , Patient's progosis: Unable to determine and Goals for treatment: Full Code  Additional follow-up to be provided: Neurologist consult, PT evaluation.  Time spent during discussion:20 minutes  Corina Stacy, Rosalio Macadamia, MD

## 2016-09-28 NOTE — ED Notes (Signed)
Patient transported to MRI 

## 2016-09-28 NOTE — ED Notes (Signed)
Secretary Emilie notified of pt coming to room 2 and to activate code stroke protocol.

## 2016-09-28 NOTE — Progress Notes (Signed)
Problem: Safety: Goal: Ability to remain free from injury will improve Outcome: Progressing High fall risk. Multiple falls recently at home. Has been using a cane to ambulate. Bed alarm on- patient educated to call for assistance out of bed. Call bell & phone within reach.  Problem: Health Behavior/Discharge Planning: Goal: Ability to manage health-related needs will improve Outcome: Progressing Patient is from home with her husband. [place]   Problem: Pain Managment: Goal: General experience of comfort will improve Outcome: Progressing No c/o pain at this time but takes Tramadol PRN for back and right leg pain.   Problem: Fluid Volume: Goal: Ability to maintain a balanced intake and output will improve Outcome: Progressing Patient states she has lost about 40lb in the last year r/t poor appetite.

## 2016-09-28 NOTE — ED Notes (Signed)
Patient reports acute aphasia starting at 11am. Reports history of another similar event. Daughter also reports patient has had increased falls in the last few months.

## 2016-09-28 NOTE — Consult Note (Addendum)
Referring Physician: Paduchowski    Chief Complaint: Slurred speech, dizziness  HPI: Becky Prince is an 83 y.o. female who reports being at baseline earlier today.  When her daughter arrived she was unable to get her words out correctly.  Reports that she was dizzy as well.  Describes her dizziness as her gelling as if she was about to pass out.  Patient was brought in for evaluation.  Was improved by the time of presentation.  Initial NIHSS of 0.   Patient has had a few similar episodes in the past for which she has not presented.  Date last known well: Date: 09/28/2016 Time last known well: Time: 10:00 tPA Given: No: Improvement   Past Medical History:  Diagnosis Date  . Depression   . Environmental allergies   . Hypercholesteremia   . Hypertension     Past Surgical History:  Procedure Laterality Date  . ABDOMINAL HYSTERECTOMY    . CATARACT EXTRACTION W/PHACO Right 06/12/2015   Procedure: CATARACT EXTRACTION PHACO AND INTRAOCULAR LENS PLACEMENT (IOC);  Surgeon: Leandrew Koyanagi, MD;  Location: [facility];  Service: Ophthalmology;  Laterality: Right;    Family History  Problem Relation Age of Onset  . Heart disease Mother        CHF  . Hypertension Mother   . Cancer Father        prostate  . Cancer Sister        breast  . Kidney disease Brother   . Breast cancer Neg Hx    Social History:  reports that she has never smoked. She has never used smokeless tobacco. She reports that she does not drink alcohol or use drugs.  Allergies:  Allergies  Allergen Reactions  . Naproxen Swelling    Had lip swelling 20 yrs ago - not sure if caused by oxycodone or naproxen  . Oxycodone-Aspirin Swelling    Had lip swelling 20 yrs ago. Not sure if caused by oxycodone or naproxen.    Medications: I have reviewed the patient's current medications. Prior to Admission:  Prior to Admission medications   Medication Sig Start Date End Date Taking? Authorizing Provider  aspirin  81 MG tablet Take 81 mg by mouth daily.     [provider]  citalopram (CELEXA) 20 MG tablet TAKE ONE TABLET BY MOUTH ONCE DAILY 06/13/16   Mar Daring, PA-C  fluticasone [facility]) 50 MCG/ACT nasal spray INSTILL 2 SPRAYS INTO EACH NOSTRIL DAILY 09/01/16   Mar Daring, PA-C  meloxicam (MOBIC) 7.5 MG tablet Take 1 tablet (7.5 mg total) by mouth daily. 07/13/16   Mar Daring, PA-C  Multiple Vitamins-Minerals (MULTIVITAMIN) tablet Take 1 tablet by mouth daily. 12/02/15   Mar Daring, PA-C  Omega-3 Fatty Acids (FISH OIL) 1200 MG CAPS Take 1 capsule (1,200 mg total) by mouth 2 (two) times daily. 12/02/15   Mar Daring, PA-C  simvastatin (ZOCOR) 20 MG tablet TAKE 1 TABLET BY MOUTH EVERY EVENING 07/17/16   Mar Daring, PA-C  traMADol (ULTRAM) 50 MG tablet Take 1 tablet (50 mg total) by mouth every 6 (six) hours as needed. 12/02/15   Mar Daring, PA-C  triamterene-hydrochlorothiazide (MAXZIDE-25) 37.5-25 MG tablet Take 0.5 tablets by mouth daily. 07/18/16   Mar Daring, PA-C  vitamin E 100 UNIT capsule Take 2 capsules (200 Units total) by mouth daily. 12/02/15   Mar Daring, PA-C     ROS: History obtained from the patient  General ROS: negative for -  chills, fatigue, fever, night sweats, weight gain or weight loss Psychological ROS: negative for - behavioral disorder, hallucinations, memory difficulties, mood swings or suicidal ideation Ophthalmic ROS: negative for - blurry vision, double vision, eye pain or loss of vision ENT ROS: negative for - epistaxis, nasal discharge, oral lesions, sore throat, tinnitus or vertigo Allergy and Immunology ROS: negative for - hives or itchy/watery eyes Hematological and Lymphatic ROS: negative for - bleeding problems, bruising or swollen lymph nodes Endocrine ROS: negative for - galactorrhea, hair pattern changes, polydipsia/polyuria or temperature intolerance Respiratory ROS: negative for  - cough, hemoptysis, shortness of breath or wheezing Cardiovascular ROS: negative for - chest pain, dyspnea on exertion, edema or irregular heartbeat Gastrointestinal ROS: negative for - abdominal pain, diarrhea, hematemesis, nausea/vomiting or stool incontinence Genito-Urinary ROS: negative for - dysuria, hematuria, incontinence or urinary frequency/urgency Musculoskeletal ROS: negative for - joint swelling or muscular weakness Neurological ROS: as noted in HPI Dermatological ROS: negative for rash and skin lesion changes  Physical Examination: Blood pressure (!) 186/106, pulse 91, temperature 97.7 F (36.5 C), temperature source Oral, resp. rate 18, weight 59 kg (130 lb), SpO2 95 %.  HEENT-  Normocephalic, no lesions, without obvious abnormality.  Normal external eye and conjunctiva.  Normal TM's bilaterally.  Normal auditory canals and external ears. Normal external nose, mucus membranes and septum.  Normal pharynx. Cardiovascular- S1, S2 normal, pulses palpable throughout   Lungs- chest clear, no wheezing, rales, normal symmetric air entry Abdomen- soft, non-tender; bowel sounds normal; no masses,  no organomegaly Extremities- no edema Lymph-no adenopathy palpable Musculoskeletal-right leg pain Skin-warm and dry, no hyperpigmentation, vitiligo, or suspicious lesions  Neurological Examination   Mental Status: Alert, oriented, thought content appropriate.  Knows current President but none previous.  Spells WORLD forward but unable to spell it backward.  In counting backward from 100 by 7's gives 100 and 30.  Speech fluent without evidence of aphasia.  Able to follow 3 step commands without difficulty. Cranial Nerves: II: Discs flat bilaterally; Visual fields grossly normal, pupils equal, round, reactive to light and accommodation III,IV, VI: ptosis not present, extra-ocular motions intact bilaterally V,VII: smile symmetric, facial light touch sensation normal bilaterally VIII: hearing  normal bilaterally IX,X: gag reflex present XI: bilateral shoulder shrug XII: midline tongue extension Motor: Right : Upper extremity   5/5    Left:     Upper extremity   5/5  Lower extremity   5/5     Lower extremity   5/5 Tone and bulk:normal tone throughout; no atrophy noted Sensory: Pinprick and light touch intact throughout, bilaterally Deep Tendon Reflexes: 2+ and symmetric with absent AJ's bilaterally Plantars: Right: mute   Left: mute Cerebellar: Normal finger-to-nose and normal heel-to-shin testing bilaterally Gait: not tested due to safety concerns    Laboratory Studies:  Basic Metabolic Panel: No results for input(s): NA, K, CL, CO2, GLUCOSE, BUN, CREATININE, CALCIUM, MG, PHOS in the last 168 hours.  Liver Function Tests: No results for input(s): AST, ALT, ALKPHOS, BILITOT, PROT, ALBUMIN in the last 168 hours. No results for input(s): LIPASE, AMYLASE in the last 168 hours. No results for input(s): AMMONIA in the last 168 hours.  CBC: No results for input(s): WBC, NEUTROABS, HGB, HCT, MCV, PLT in the last 168 hours.  Cardiac Enzymes: No results for input(s): CKTOTAL, CKMB, CKMBINDEX, TROPONINI in the last 168 hours.  BNP: Invalid input(s): POCBNP  CBG: No results for input(s): GLUCAP in the last 168 hours.  Microbiology: Results for orders placed or  performed in visit on 12/02/15  Urine culture     Status: None   Collection Time: 12/02/15 12:00 AM  Result Value Ref Range Status   Urine Culture, Routine Final report  Final   Urine Culture result 1 Comment  Final    Comment: Mixed urogenital flora Less than 10,000 colonies/mL     Coagulation Studies: No results for input(s): LABPROT, INR in the last 72 hours.  Urinalysis: No results for input(s): COLORURINE, LABSPEC, PHURINE, GLUCOSEU, HGBUR, BILIRUBINUR, KETONESUR, PROTEINUR, UROBILINOGEN, NITRITE, LEUKOCYTESUR in the last 168 hours.  Invalid input(s): APPERANCEUR  Lipid Panel:    Component Value  Date/Time   CHOL 170 02/13/2016 0855   TRIG 128 02/13/2016 0855   HDL 61 02/13/2016 0855   CHOLHDL 2.8 02/13/2016 0855   LDLCALC 83 02/13/2016 0855    HgbA1C: No results found for: HGBA1C  Urine Drug Screen:  No results found for: LABOPIA, COCAINSCRNUR, LABBENZ, AMPHETMU, THCU, LABBARB  Alcohol Level: No results for input(s): ETH in the last 168 hours.  Other results: EKG: sinus rhythm at 82 bpm.  Imaging: Ct Head Code Stroke W/o Cm  Result Date: 09/28/2016 CLINICAL DATA:  Code stroke.  Slurred speech beginning 1 hour ago. EXAM: CT HEAD WITHOUT CONTRAST TECHNIQUE: Contiguous axial images were obtained from the base of the skull through the vertex without intravenous contrast. COMPARISON:  None. FINDINGS: Brain: Mild generalized brain atrophy. Chronic small-vessel ischemic changes of the hemispheric white matter. No sign of acute infarction, mass lesion, hemorrhage, hydrocephalus or extra-axial collection. Vascular: There is atherosclerotic calcification of the major vessels at the base of the brain. Skull: Negative Sinuses/Orbits: Some inflammatory change in the right division of the sphenoid sinus. Orbits negative. Other: None ASPECTS ([place] Stroke Program Early CT Score) - Ganglionic level infarction (caudate, lentiform nuclei, internal capsule, insula, M1-M3 cortex): 7 - Supraganglionic infarction (M4-M6 cortex): 3 Total score (0-10 with 10 being normal): 10 IMPRESSION: 1. No acute finding by CT. Atrophy and chronic small-vessel ischemic change of the white matter. 2. ASPECTS is 10. These results were called by telephone at the time of interpretation on 09/28/2016 at 12:40 pm to Dr. Harvest Dark , who verbally acknowledged these results. Electronically Signed   By: Nelson Chimes M.D.   On: 09/28/2016 12:41    Assessment: 83 y.o. female presenting with complaints of dizziness and slurred speech that have resolved.  Patient now at baseline.  Has had previous similar events.  On ASA at home.   Head CT reviewed and shows no acute changes but evidence of small vessel disease.  Further work up recommended for TIA versus hypertensive encephalopathy.    Stroke Risk Factors - hyperlipidemia and hypertension  Plan: 1. HgbA1c, fasting lipid panel 2. MRI, MRA  of the brain without contrast 3. PT consult, OT consult, Speech consult 4. Echocardiogram 5. Carotid dopplers 6. Prophylactic therapy-Continue ASA 7. NPO until RN stroke swallow screen 8. Telemetry monitoring 9. Frequent neuro checks 10. BP control   Case discussed with Dr. Percell Belt, MD Neurology [phone] 09/28/2016, 12:49 PM

## 2016-09-28 NOTE — H&P (Signed)
[facility] [place] NAME: Becky Prince    MR#:  [id number]  DATE OF BIRTH:  31-Dec-1925  DATE OF ADMISSION:  09/28/2016  PRIMARY CARE PHYSICIAN: Mar Daring, PA-C   REQUESTING/REFERRING PHYSICIAN: paduchowski  CHIEF COMPLAINT:   Chief Complaint  Patient presents with  . Aphasia    HISTORY OF PRESENT ILLNESS: Becky Prince  is a 83 y.o. female with a known history of Depression, hypercholesterolemia, hypertension- lives with her husband and able to take care of herself at baseline. Today morning she was fine when she woke up at around 11 when daughter was visiting her she noted she has difficulty in finding words and SPEECH. Patient also felt some numbness on her lips but denied any other symptoms. She denies any numbness or weakness in her arms or legs or headache or vision problems. Her symptoms subsided within 1-1 hour. In ER she is seen by a neurologist and she suggested to do the stroke workup.  PAST MEDICAL HISTORY:   Past Medical History:  Diagnosis Date  . Depression   . Environmental allergies   . Hypercholesteremia   . Hypertension     PAST SURGICAL HISTORY: Past Surgical History:  Procedure Laterality Date  . ABDOMINAL HYSTERECTOMY    . CATARACT EXTRACTION W/PHACO Right 06/12/2015   Procedure: CATARACT EXTRACTION PHACO AND INTRAOCULAR LENS PLACEMENT (IOC);  Surgeon: Leandrew Koyanagi, MD;  Location: [facility];  Service: Ophthalmology;  Laterality: Right;    SOCIAL HISTORY:  Social History  Substance Use Topics  . Smoking status: Never Smoker  . Smokeless tobacco: Never Used  . Alcohol use No    FAMILY HISTORY:  Family History  Problem Relation Age of Onset  . Heart disease Mother        CHF  . Hypertension Mother   . Cancer Father        prostate  . Cancer Sister        breast  . Kidney disease Brother   . Breast cancer Neg Hx     DRUG ALLERGIES:  Allergies  Allergen Reactions  . Naproxen  Swelling    Had lip swelling 20 yrs ago - not sure if caused by oxycodone or naproxen  . Oxycodone-Aspirin Swelling    Had lip swelling 20 yrs ago. Not sure if caused by oxycodone or naproxen.    REVIEW OF SYSTEMS:   CONSTITUTIONAL: No fever, fatigue or weakness.  EYES: No blurred or double vision.  EARS, NOSE, AND THROAT: No tinnitus or ear pain.  RESPIRATORY: No cough, shortness of breath, wheezing or hemoptysis.  CARDIOVASCULAR: No chest pain, orthopnea, edema.  GASTROINTESTINAL: No nausea, vomiting, diarrhea or abdominal pain.  GENITOURINARY: No dysuria, hematuria.  ENDOCRINE: No polyuria, nocturia,  HEMATOLOGY: No anemia, easy bruising or bleeding SKIN: No rash or lesion. MUSCULOSKELETAL: No joint pain or arthritis.   NEUROLOGIC: No tingling, numbness, weakness. Speech problem. PSYCHIATRY: No anxiety or depression.   MEDICATIONS AT HOME:  Prior to Admission medications   Medication Sig Start Date End Date Taking? Authorizing Provider  aspirin 81 MG tablet Take 81 mg by mouth daily.    Yes [provider]  citalopram (CELEXA) 20 MG tablet TAKE ONE TABLET BY MOUTH ONCE DAILY 06/13/16  Yes Fenton Malling M, PA-C  fluticasone [facility]) 50 MCG/ACT nasal spray INSTILL 2 SPRAYS INTO EACH NOSTRIL DAILY 09/01/16  Yes Mar Daring, PA-C  meloxicam (MOBIC) 7.5 MG tablet Take 1 tablet (7.5 mg total) by  mouth daily. 07/13/16  Yes Mar Daring, PA-C  Multiple Vitamins-Minerals (MULTIVITAMIN) tablet Take 1 tablet by mouth daily. 12/02/15  Yes Mar Daring, PA-C  Omega-3 Fatty Acids (FISH OIL) 1200 MG CAPS Take 1 capsule by mouth every other day.  12/02/15  Yes Mar Daring, PA-C  traMADol (ULTRAM) 50 MG tablet Take 1 tablet (50 mg total) by mouth every 6 (six) hours as needed. 12/02/15  Yes Mar Daring, PA-C  triamterene-hydrochlorothiazide (MAXZIDE-25) 37.5-25 MG tablet Take 0.5 tablets by mouth daily. Patient taking differently: Take 1 tablet by  mouth daily.  07/18/16  Yes Mar Daring, PA-C  vitamin E 100 UNIT capsule Take 2 capsules (200 Units total) by mouth daily. 12/02/15  Yes Fenton Malling M, PA-C  simvastatin (ZOCOR) 20 MG tablet TAKE 1 TABLET BY MOUTH EVERY EVENING Patient not taking: Reported on 09/28/2016 07/17/16   Mar Daring, PA-C      PHYSICAL EXAMINATION:   VITAL SIGNS: Blood pressure (!) 157/78, pulse 74, temperature 97.7 F (36.5 C), temperature source Oral, resp. rate 16, weight 59 kg (130 lb), SpO2 95 %.  GENERAL:  83 y.o.-year-old patient lying in the bed with no acute distress.  EYES: Pupils equal, round, reactive to light and accommodation. No scleral icterus. Extraocular muscles intact.  HEENT: Head atraumatic, normocephalic. Oropharynx and nasopharynx clear.  NECK:  Supple, no jugular venous distention. No thyroid enlargement, no tenderness.  LUNGS: Normal breath sounds bilaterally, no wheezing, rales,rhonchi or crepitation. No use of accessory muscles of respiration.  CARDIOVASCULAR: S1, S2 normal. No murmurs, rubs, or gallops.  ABDOMEN: Soft, nontender, nondistended. Bowel sounds present. No organomegaly or mass.  EXTREMITIES: No pedal edema, cyanosis, or clubbing.  NEUROLOGIC: Cranial nerves II through XII are intact. Muscle strength 5/5 in all extremities. Sensation intact. Gait not checked.  PSYCHIATRIC: The patient is alert and oriented x 3.  SKIN: No obvious rash, lesion, or ulcer.   LABORATORY PANEL:   CBC  Recent Labs Lab 09/28/16 1223  WBC 8.7  HGB 14.4  HCT 41.6  PLT 263  MCV 93.2  MCH 32.3  MCHC 34.7  RDW 12.8  LYMPHSABS 2.3  MONOABS 0.7  EOSABS 0.1  BASOSABS 0.0   ------------------------------------------------------------------------------------------------------------------  Chemistries   Recent Labs Lab 09/28/16 1223  NA 129*  K 4.2  CL 91*  CO2 28  GLUCOSE 101*  BUN 24*  CREATININE 1.18*  CALCIUM 10.0  AST 19  ALT 12*  ALKPHOS 98  BILITOT  0.9   ------------------------------------------------------------------------------------------------------------------ CrCl cannot be calculated (Unknown ideal weight.). ------------------------------------------------------------------------------------------------------------------ No results for input(s): TSH, T4TOTAL, T3FREE, THYROIDAB in the last 72 hours.  Invalid input(s): FREET3   Coagulation profile  Recent Labs Lab 09/28/16 1223  INR 0.97   ------------------------------------------------------------------------------------------------------------------- No results for input(s): DDIMER in the last 72 hours. -------------------------------------------------------------------------------------------------------------------  Cardiac Enzymes  Recent Labs Lab 09/28/16 1223  TROPONINI <0.03   ------------------------------------------------------------------------------------------------------------------ Invalid input(s): POCBNP  ---------------------------------------------------------------------------------------------------------------  Urinalysis    Component Value Date/Time   BILIRUBINUR Negative 12/02/2015 1215   PROTEINUR Negative 12/02/2015 1215   UROBILINOGEN 0.2 12/02/2015 1215   NITRITE Negative 12/02/2015 1215   LEUKOCYTESUR moderate (2+) (A) 12/02/2015 1215     RADIOLOGY: Ct Head Code Stroke W/o Cm  Result Date: 09/28/2016 CLINICAL DATA:  Code stroke.  Slurred speech beginning 1 hour ago. EXAM: CT HEAD WITHOUT CONTRAST TECHNIQUE: Contiguous axial images were obtained from the base of the skull through the vertex without intravenous contrast. COMPARISON:  None. FINDINGS: Brain: Mild  generalized brain atrophy. Chronic small-vessel ischemic changes of the hemispheric white matter. No sign of acute infarction, mass lesion, hemorrhage, hydrocephalus or extra-axial collection. Vascular: There is atherosclerotic calcification of the major vessels at the  base of the brain. Skull: Negative Sinuses/Orbits: Some inflammatory change in the right division of the sphenoid sinus. Orbits negative. Other: None ASPECTS ([place] Stroke Program Early CT Score) - Ganglionic level infarction (caudate, lentiform nuclei, internal capsule, insula, M1-M3 cortex): 7 - Supraganglionic infarction (M4-M6 cortex): 3 Total score (0-10 with 10 being normal): 10 IMPRESSION: 1. No acute finding by CT. Atrophy and chronic small-vessel ischemic change of the white matter. 2. ASPECTS is 10. These results were called by telephone at the time of interpretation on 09/28/2016 at 12:40 pm to Dr. Harvest Dark , who verbally acknowledged these results. Electronically Signed   By: Nelson Chimes M.D.   On: 09/28/2016 12:41    EKG: Orders placed or performed during the hospital encounter of 09/28/16  . ED EKG  . ED EKG    IMPRESSION AND PLAN:  * TIA   We'll monitor on cardiac monitoring, do MRI brain and MR angiogram on head, carotid Doppler study and echocardiogram.   Frequent neuro checks, speech and swallow evaluation, physical therapy evaluation.    Patient is seen by neurologist.  * Uncontrolled hypertension   Currently we'll allow to have a systolic blood pressure up to 200 due to possibility of stroke.   We may restart medication and control tomorrow once stroke is ruled out.  * Hyperlipidemia   Continue simvastatin.  All the records are reviewed and case discussed with ED provider. Management plans discussed with the patient, family and they are in agreement.  CODE STATUS: Full code Code Status History    This patient does not have a recorded code status. Please follow your organizational policy for patients in this situation.     Plan discussed with patient's daughter and husband were present in the room.  TOTAL TIME TAKING CARE OF THIS PATIENT: 45 minutes.    Vaughan Basta M.D on 09/28/2016   Between 7am to 6pm - Pager - [phone]  After 6pm go  to [email or web address] - password [email or web address] [facility]  Office  [phone]  CC: Primary care physician; Mar Daring, PA-C   Note: This dictation was prepared with Dragon dictation along with smaller phrase technology. Any transcriptional errors that result from this process are unintentional.

## 2016-09-28 NOTE — ED Triage Notes (Signed)
Pt arrives to ER via POV with daughter c/o dizziness and slurred speech with acute onset at 11AM today. Daughter reports slurred speech is improving but is not back to baseline at this time. Left leg minimally weaker than right leg, pt reports right leg is usually her weak leg. No facial droop, pt able to speak in full and complete sentences. Pt alert and oriented X4, active, cooperative, pt in NAD. RR even and unlabored, color WNL.

## 2016-09-28 NOTE — Care Management Obs Status (Signed)
[place] NOTIFICATION   Patient Details  Name: Becky Prince MRN: [id number] Date of Birth: Jun 06, 1925   Medicare Observation Status Notification Given:  Yes    Beau Fanny, RN 09/28/2016, 2:28 PM

## 2016-09-29 ENCOUNTER — Observation Stay: Payer: PPO

## 2016-09-29 ENCOUNTER — Telehealth: Payer: Self-pay | Admitting: Physician Assistant

## 2016-09-29 ENCOUNTER — Telehealth: Payer: Self-pay | Admitting: Family Medicine

## 2016-09-29 DIAGNOSIS — R29818 Other symptoms and signs involving the nervous system: Secondary | ICD-10-CM | POA: Diagnosis not present

## 2016-09-29 DIAGNOSIS — E785 Hyperlipidemia, unspecified: Secondary | ICD-10-CM | POA: Diagnosis not present

## 2016-09-29 DIAGNOSIS — R55 Syncope and collapse: Secondary | ICD-10-CM | POA: Diagnosis not present

## 2016-09-29 DIAGNOSIS — I10 Essential (primary) hypertension: Secondary | ICD-10-CM | POA: Diagnosis not present

## 2016-09-29 DIAGNOSIS — G459 Transient cerebral ischemic attack, unspecified: Secondary | ICD-10-CM | POA: Diagnosis not present

## 2016-09-29 LAB — CBC
HCT: 38.1 % (ref 35.0–47.0)
Hemoglobin: 13.3 g/dL (ref 12.0–16.0)
MCH: 32.4 pg (ref 26.0–34.0)
MCHC: 34.8 g/dL (ref 32.0–36.0)
MCV: 93.3 fL (ref 80.0–100.0)
PLATELETS: 237 10*3/uL (ref 150–440)
RBC: 4.09 MIL/uL (ref 3.80–5.20)
RDW: 12.6 % (ref 11.5–14.5)
WBC: 5.3 10*3/uL (ref 3.6–11.0)

## 2016-09-29 LAB — BASIC METABOLIC PANEL
Anion gap: 7 (ref 5–15)
BUN: 23 mg/dL — ABNORMAL HIGH (ref 6–20)
CHLORIDE: 92 mmol/L — AB (ref 101–111)
CO2: 29 mmol/L (ref 22–32)
CREATININE: 1.02 mg/dL — AB (ref 0.44–1.00)
Calcium: 9.6 mg/dL (ref 8.9–10.3)
GFR calc non Af Amer: 48 mL/min — ABNORMAL LOW (ref >60)
GFR, EST AFRICAN AMERICAN: 55 mL/min — AB (ref >60)
Glucose, Bld: 102 mg/dL — ABNORMAL HIGH (ref 65–99)
POTASSIUM: 4.2 mmol/L (ref 3.5–5.1)
SODIUM: 128 mmol/L — AB (ref 135–145)

## 2016-09-29 LAB — ECHOCARDIOGRAM COMPLETE
Height: 68 in
WEIGHTICAEL: 1963.2 [oz_av]

## 2016-09-29 LAB — HEMOGLOBIN A1C
HEMOGLOBIN A1C: 5.1 % (ref 4.8–5.6)
MEAN PLASMA GLUCOSE: 100 mg/dL

## 2016-09-29 MED ORDER — CLOPIDOGREL BISULFATE 75 MG PO TABS: 75.0000 mg | ORAL_TABLET | Freq: Every day | ORAL | 11 refills | Status: DC

## 2016-09-29 MED ORDER — AMLODIPINE BESYLATE 10 MG PO TABS: 10.0000 mg | ORAL_TABLET | Freq: Every day | ORAL | 11 refills | Status: DC

## 2016-09-29 MED ORDER — ENSURE ENLIVE PO LIQD: 237.0000 mL | Freq: Two times a day (BID) | ORAL | Status: DC

## 2016-09-29 MED ORDER — ATORVASTATIN CALCIUM 40 MG PO TABS: 40.0000 mg | ORAL_TABLET | Freq: Every day | ORAL | 0 refills | Status: DC

## 2016-09-29 MED ORDER — ENSURE ENLIVE PO LIQD: 237.0000 mL | Freq: Two times a day (BID) | ORAL | 12 refills | Status: DC

## 2016-09-29 MED ORDER — ADULT MULTIVITAMIN W/MINERALS CH: 1.0000 | ORAL_TABLET | Freq: Every day | ORAL | Status: DC

## 2016-09-29 NOTE — Telephone Encounter (Signed)
Admitted 09/28/2016 and D/C 09/30/2016. Can you please make TIC call? Thanks! Renaldo Fiddler, CMA

## 2016-09-29 NOTE — Progress Notes (Signed)
Discharge paperwork reviewed with patient, patient's husband, and patient's daughter with patient's permission who all verbalized understanding of new medications, medication changes, and current follow up apt with Lillia Abed PA at [facility] and Dr. Manuella Ghazi at [facility]. Patient is stable and ready for discharge. Patient instructed to check BP twice a day and keep a log to take to follow up apt to see if BP. Patient's daughter and husband to transport home.

## 2016-09-29 NOTE — Telephone Encounter (Signed)
Skylar a nurse with [facility] wanted to let us know that pt is discharging from the hospital today 09/30/2016 and was treated for TIA. Pt's children are there and after the follow up appt was scheduled with Tawanna Sat they decided that they would like pt to follow up with an MD. Pt was seeing Dr. Venia Minks before she left. Can the hospital f/u be scheduled with Dr. Caryn Section and transfer primary care? Skylar advised that the pt needs to be seen in a week from discharge. Please advise. Thanks TNP

## 2016-09-29 NOTE — Telephone Encounter (Signed)
Would this be okay? Renaldo Fiddler, CMA

## 2016-09-29 NOTE — Progress Notes (Signed)
No c/o dizziness today. Pt feels she is back to her baseline. Husband and daughter at bedside.

## 2016-09-29 NOTE — Telephone Encounter (Signed)
Can you all please schedule? Thanks. Renaldo Fiddler, CMA

## 2016-09-29 NOTE — Progress Notes (Addendum)
Subjective: Patient back to baseline.  No further neurological complaints.    Objective: Current vital signs: BP (!) 171/88 (BP Location: Right Arm)   Pulse 69   Temp 97.9 F (36.6 C) (Oral)   Resp 17   Ht 5\' 8"  (1.727 m)   Wt 55.7 kg (122 lb 11.2 oz)   SpO2 96%   BMI 18.66 kg/m  Vital signs in last 24 hours: Temp:  [97.9 F (36.6 C)-98.6 F (37 C)] 97.9 F (36.6 C) (06/05 0423) Pulse Rate:  [69-79] 69 (06/05 0423) Resp:  [16-20] 17 (06/05 0423) BP: (157-185)/(69-99) 171/88 (06/05 0423) SpO2:  [95 %-98 %] 96 % (06/05 0423) Weight:  [55.7 kg (122 lb 11.2 oz)] 55.7 kg (122 lb 11.2 oz) (06/04 1625)  Intake/Output from previous day: No intake/output data recorded. Intake/Output this shift: No intake/output data recorded. Nutritional status: Diet Heart Room service appropriate? Yes; Fluid consistency: Thin  Neurologic Exam: Mental Status: Alert, oriented, thought content appropriate.  Speech fluent without evidence of aphasia.  Able to follow 3 step commands without difficulty. Cranial Nerves: II: Discs flat bilaterally; Visual fields grossly normal, pupils equal, round, reactive to light and accommodation III,IV, VI: ptosis not present, extra-ocular motions intact bilaterally V,VII: smile symmetric, facial light touch sensation normal bilaterally VIII: hearing normal bilaterally IX,X: gag reflex present XI: bilateral shoulder shrug XII: midline tongue extension Motor: Right : Upper extremity   5/5    Left:     Upper extremity   5/5  Lower extremity   5/5     Lower extremity   5/5 Tone and bulk:normal tone throughout; no atrophy noted Sensory: Pinprick and light touch intact throughout, bilaterally   Lab Results: Basic Metabolic Panel:  Recent Labs Lab 09/28/16 1223 10/24/2016 0525  NA 129* 128*  K 4.2 4.2  CL 91* 92*  CO2 28 29  GLUCOSE 101* 102*  BUN 24* 23*  CREATININE 1.18* 1.02*  CALCIUM 10.0 9.6    Liver Function Tests:  Recent Labs Lab  09/28/16 1223  AST 19  ALT 12*  ALKPHOS 98  BILITOT 0.9  PROT 8.1  ALBUMIN 4.7   No results for input(s): LIPASE, AMYLASE in the last 168 hours. No results for input(s): AMMONIA in the last 168 hours.  CBC:  Recent Labs Lab 09/28/16 1223 10/02/2016 0525  WBC 8.7 5.3  NEUTROABS 5.6  --   HGB 14.4 13.3  HCT 41.6 38.1  MCV 93.2 93.3  PLT 263 237    Cardiac Enzymes:  Recent Labs Lab 09/28/16 1223  TROPONINI <0.03    Lipid Panel:  Recent Labs Lab 09/28/16 1223  CHOL 176  TRIG 131  HDL 68  CHOLHDL 2.6  VLDL 26  LDLCALC 82    CBG:  Recent Labs Lab 09/28/16 1249  GLUCAP 87    Microbiology: Results for orders placed or performed in visit on 12/02/15  Urine culture     Status: None   Collection Time: 12/02/15 12:00 AM  Result Value Ref Range Status   Urine Culture, Routine Final report  Final   Urine Culture result 1 Comment  Final    Comment: Mixed urogenital flora Less than 10,000 colonies/mL     Coagulation Studies:  Recent Labs  09/28/16 1223  LABPROT 12.9  INR 0.97    Imaging: [name] Head Wo Contrast  Result Date: 09/28/2016 CLINICAL DATA:  Acute presentation with speech disturbance this morning. EXAM: MRI HEAD WITHOUT CONTRAST MRA HEAD WITHOUT CONTRAST TECHNIQUE: Multiplanar, multiecho pulse sequences  of the brain and surrounding structures were obtained without intravenous contrast. Angiographic images of the head were obtained using MRA technique without contrast. COMPARISON:  CT earlier same day FINDINGS: MRI HEAD FINDINGS Brain: Diffusion imaging does not show any acute or subacute infarction. There chronic small-vessel ischemic changes of the pons, with some hemosiderin deposition. Few old small vessel cerebellar infarctions. Cerebral hemispheres show extensive chronic small-vessel ischemic changes affecting the thalami, basal ganglia and hemispheric white matter. No cortical or large vessel territory infarction. No mass lesion, hemorrhage,  hydrocephalus or extra-axial collection. Vascular: Major vessels at the base of the brain show flow. Skull and upper cervical spine: Negative Sinuses/Orbits: Mucoid material in the right sphenoid sinus. Orbits negative. Other: None MRA HEAD FINDINGS Both internal carotid arteries are patent through the skullbase. Right ICA supplies mostly the right MCA territory. Tiny A1 segment. Left ICA supplies the left PCA, the left MCA and both ACA territories. There is distal vessel atherosclerotic narrowing in the MCA branches. Right vertebral artery terminates in PICA. Left vertebral artery supplies the basilar. No basilar stenosis. Posterior circulation branch vessels are patent. Atherosclerotic narrowing in the more distal PCA branches. IMPRESSION: No acute or subacute infarction. Extensive chronic small vessel ischemic changes throughout the brain as outlined above. Intracranial MR angiography does not show any large or medium vessel occlusion or correctable proximal stenosis. Distal vessel atherosclerotic irregularity. Electronically Signed   By: Nelson Chimes M.D.   On: 09/28/2016 16:19   Mr Brain Wo Contrast  Result Date: 09/28/2016 CLINICAL DATA:  Acute presentation with speech disturbance this morning. EXAM: MRI HEAD WITHOUT CONTRAST MRA HEAD WITHOUT CONTRAST TECHNIQUE: Multiplanar, multiecho pulse sequences of the brain and surrounding structures were obtained without intravenous contrast. Angiographic images of the head were obtained using MRA technique without contrast. COMPARISON:  CT earlier same day FINDINGS: MRI HEAD FINDINGS Brain: Diffusion imaging does not show any acute or subacute infarction. There chronic small-vessel ischemic changes of the pons, with some hemosiderin deposition. Few old small vessel cerebellar infarctions. Cerebral hemispheres show extensive chronic small-vessel ischemic changes affecting the thalami, basal ganglia and hemispheric white matter. No cortical or large vessel territory  infarction. No mass lesion, hemorrhage, hydrocephalus or extra-axial collection. Vascular: Major vessels at the base of the brain show flow. Skull and upper cervical spine: Negative Sinuses/Orbits: Mucoid material in the right sphenoid sinus. Orbits negative. Other: None MRA HEAD FINDINGS Both internal carotid arteries are patent through the skullbase. Right ICA supplies mostly the right MCA territory. Tiny A1 segment. Left ICA supplies the left PCA, the left MCA and both ACA territories. There is distal vessel atherosclerotic narrowing in the MCA branches. Right vertebral artery terminates in PICA. Left vertebral artery supplies the basilar. No basilar stenosis. Posterior circulation branch vessels are patent. Atherosclerotic narrowing in the more distal PCA branches. IMPRESSION: No acute or subacute infarction. Extensive chronic small vessel ischemic changes throughout the brain as outlined above. Intracranial MR angiography does not show any large or medium vessel occlusion or correctable proximal stenosis. Distal vessel atherosclerotic irregularity. Electronically Signed   By: Nelson Chimes M.D.   On: 09/28/2016 16:19   Ct Head Code Stroke W/o Cm  Result Date: 09/28/2016 CLINICAL DATA:  Code stroke.  Slurred speech beginning 1 hour ago. EXAM: CT HEAD WITHOUT CONTRAST TECHNIQUE: Contiguous axial images were obtained from the base of the skull through the vertex without intravenous contrast. COMPARISON:  None. FINDINGS: Brain: Mild generalized brain atrophy. Chronic small-vessel ischemic changes of the hemispheric white  matter. No sign of acute infarction, mass lesion, hemorrhage, hydrocephalus or extra-axial collection. Vascular: There is atherosclerotic calcification of the major vessels at the base of the brain. Skull: Negative Sinuses/Orbits: Some inflammatory change in the right division of the sphenoid sinus. Orbits negative. Other: None ASPECTS ([place] Stroke Program Early CT Score) - Ganglionic level  infarction (caudate, lentiform nuclei, internal capsule, insula, M1-M3 cortex): 7 - Supraganglionic infarction (M4-M6 cortex): 3 Total score (0-10 with 10 being normal): 10 IMPRESSION: 1. No acute finding by CT. Atrophy and chronic small-vessel ischemic change of the white matter. 2. ASPECTS is 10. These results were called by telephone at the time of interpretation on 09/28/2016 at 12:40 pm to Dr. Harvest Dark , who verbally acknowledged these results. Electronically Signed   By: Nelson Chimes M.D.   On: 09/28/2016 12:41    Medications:  I have reviewed the patient's current medications. Scheduled: . aspirin EC  81 mg Oral Daily  . atorvastatin  40 mg Oral q1800  . citalopram  20 mg Oral Daily  . fluticasone  2 spray Each Nare Daily  . heparin  5,000 Units Subcutaneous Q8H  . meloxicam  7.5 mg Oral Daily  . omega-3 acid ethyl esters  1 g Oral Daily  . triamterene-hydrochlorothiazide  1 tablet Oral Daily    Assessment/Plan: Patient baseline.  MRI of the brain reviewed and shows no acute changes. MRA shows no evidence of emergent large vessel occlusion. TIA likely.  A1c 5.1,  LDL 82.  Echocardiogram and carotid dopplers are pending.    Recommendations: 1.  Plavix 75mg  daily 2.  Aggressive lipid management with btarget LDL < 70.     LOS: 0 days   Alexis Goodell, MD Neurology [phone] 10/18/2016  1:06 PM

## 2016-09-29 NOTE — Care Management (Signed)
Admitted to [facility] under observation status with the diagnosis of TIA. Lives with husband, Timmothy Sours, [phone]), Last seen Dr. Lubertha Sayres 09/18/16. Last seen Dr. Deloria Lair 01/13/17. Physical therapy evaluation completed. Recommending home with home health and therapy. Discussed home health agencies. [facility]ose [facility]. Discussed that there is a co-pay for these  Services. Would like Nursing and physical therapy. Updated Floydene Flock, [facility] representative. Will run insurance and update Mr & Ms Esty about co-pays. Discharge to home today per Dr. Reginia Forts RN MSN [facility] Care Management [phone]

## 2016-09-29 NOTE — Progress Notes (Signed)
Initial Nutrition Assessment  DOCUMENTATION CODES:   Severe malnutrition in context of social or environmental circumstances  INTERVENTION:  Recommend liberalizing diet to Regular.   Provide Ensure Enlive po BID, each supplement provides 350 kcal and 20 grams of protein. Encouraged patient to drink at home.   Provide multivitamins with minerals daily.  Encouraged adequate intake of calories and protein. Discussed eating a protein source at each meal and discussed options patient will enjoy.   Consider appetite stimulant if medically appropriate.  NUTRITION DIAGNOSIS:   Malnutrition (Severe) related to social / environmental circumstances (unable to prepare meals anymore, loss of appetite) as evidenced by severe depletion of body fat, severe depletion of muscle mass.  GOAL:   Patient will meet greater than or equal to 90% of their needs  MONITOR:   PO intake, Supplement acceptance, Labs, Weight trends, I & O's  REASON FOR ASSESSMENT:   Malnutrition Screening Tool    ASSESSMENT:   83 year old female with PMHx of HTN, hypercholesteremia, depression admitted with TIA.   Spoke with patient at bedside. Husband also present. She reports she has had a poor appetite for 2-4 months. She reports she just stopped feeling hungry. She eats two smaller meals per day now. For breakfast she may have cereal or a [employer] sandwich, but cannot usually finish. Dinner is at [employer] and is usually a vegetable plate (will get sweet potatoes, spinach, occasionally spaghetti). She reports she is to weak to stand and cook a meal at home anymore.   UBW 140 lbs. Per chart patient was 139 lbs on 09/11/2015. She has lost 16.3 lbs (11.7% body weight) over the past year, which is not significant for time frame.  Medications reviewed and include: Lovaza 1 gram daily, Colace.  Labs reviewed: Sodium 128, Chloride 92, BUN 23, Creatinine 1.02.   Nutrition-Focused physical exam completed. Findings are  severe fat depletion, severe muscle depletion, and no edema.   Diet Order:  Diet Heart Room service appropriate? Yes; Fluid consistency: Thin  Skin:  Reviewed, no issues  Last BM:  09/28/2016  Height:   Ht Readings from Last 1 Encounters:  09/28/16 5\' 8"  (1.727 m)    Weight:   Wt Readings from Last 1 Encounters:  09/28/16 122 lb 11.2 oz (55.7 kg)    Ideal Body Weight:  63.6 kg  BMI:  Body mass index is 18.66 kg/m.  Estimated Nutritional Needs:   Kcal:  1250-1450 (MSJ x 1.2-1.4)  Protein:  65-78 grams (1.2-1.4 grams/kg)  Fluid:  1.4 L/day (25 ml/kg)  EDUCATION NEEDS:   Education needs addressed  Willey Blade, MS, RD, LDN Pager: [phone] After Hours Pager: [phone]

## 2016-09-29 NOTE — Telephone Encounter (Signed)
[facility] called and schedule a hospital f/u with Jenni on 10/06/16 @ 11 am in a 45 minute slot. Pt is being discharged today 10/24/2016 and was treated for TIA. Thanks TNP

## 2016-09-29 NOTE — Telephone Encounter (Signed)
That's fine. Needs 30 minutes.

## 2016-09-29 NOTE — Discharge Summary (Signed)
[place] at [facility], 83 y.o., DOB 1925-09-11, MRN [id number]. Admission date: 09/28/2016 Discharge Date 09/27/2016 Primary MD Mar Daring, PA-C Admitting Physician Vaughan Basta, MD  Admission Diagnosis  Cerebral infarction [facility]) [I63.9] CVA (cerebral infarction) [I63.9] Transient cerebral ischemia, unspecified type [G45.9]  Discharge Diagnosis   Principal Problem:   TIA (transient ischemic attack)  Accelerated hypertension Hyperlipidemia Depression Moderate tricuspid regurg Pulmonary hypertension Hyponatremia likely due to diuretics    Meservey  is a 83 y.o. female with a known history of Depression, hypercholesterolemia, hypertension- lives with her husband and able to take care of herself at baseline. Today morning she was fine when she woke up at around 11 when daughter was visiting her she noted she has difficulty in finding words and SPEECH. Patient also felt some numbness on her lips but denied any other symptoms. Patient presented with these symptoms. Initial CT scan was negative. She was admitted for stroke rule out. Further evaluation showed MRI to be negative. Patient's echocardiogram did show some tricuspid regurg and pulmonary hypertension. Her blood pressure is elevated therefore she'll be started on Norvasc. I have recommended that she keep a log of her blood pressure to take to her primary care so medications can be adjusted. Due to her hyponatremia would try to discontinue HCTZ Triaminic training in the future. Patient does have some memory issues and was recommended by neurology to follow up as outpatient for possible evaluation for dementia            Consults  neurology  Significant Tests:  See full reports for all details     [name] Head Wo Contrast  Result Date: 09/28/2016 CLINICAL DATA:  Acute presentation with speech disturbance this morning. EXAM: MRI HEAD WITHOUT CONTRAST  MRA HEAD WITHOUT CONTRAST TECHNIQUE: Multiplanar, multiecho pulse sequences of the brain and surrounding structures were obtained without intravenous contrast. Angiographic images of the head were obtained using MRA technique without contrast. COMPARISON:  CT earlier same day FINDINGS: MRI HEAD FINDINGS Brain: Diffusion imaging does not show any acute or subacute infarction. There chronic small-vessel ischemic changes of the pons, with some hemosiderin deposition. Few old small vessel cerebellar infarctions. Cerebral hemispheres show extensive chronic small-vessel ischemic changes affecting the thalami, basal ganglia and hemispheric white matter. No cortical or large vessel territory infarction. No mass lesion, hemorrhage, hydrocephalus or extra-axial collection. Vascular: Major vessels at the base of the brain show flow. Skull and upper cervical spine: Negative Sinuses/Orbits: Mucoid material in the right sphenoid sinus. Orbits negative. Other: None MRA HEAD FINDINGS Both internal carotid arteries are patent through the skullbase. Right ICA supplies mostly the right MCA territory. Tiny A1 segment. Left ICA supplies the left PCA, the left MCA and both ACA territories. There is distal vessel atherosclerotic narrowing in the MCA branches. Right vertebral artery terminates in PICA. Left vertebral artery supplies the basilar. No basilar stenosis. Posterior circulation branch vessels are patent. Atherosclerotic narrowing in the more distal PCA branches. IMPRESSION: No acute or subacute infarction. Extensive chronic small vessel ischemic changes throughout the brain as outlined above. Intracranial MR angiography does not show any large or medium vessel occlusion or correctable proximal stenosis. Distal vessel atherosclerotic irregularity. Electronically Signed   By: Nelson Chimes M.D.   On: 09/28/2016 16:19   Mr Brain Wo Contrast  Result Date: 09/28/2016 CLINICAL DATA:  Acute presentation with speech disturbance this  morning. EXAM: MRI HEAD WITHOUT CONTRAST MRA HEAD WITHOUT  CONTRAST TECHNIQUE: Multiplanar, multiecho pulse sequences of the brain and surrounding structures were obtained without intravenous contrast. Angiographic images of the head were obtained using MRA technique without contrast. COMPARISON:  CT earlier same day FINDINGS: MRI HEAD FINDINGS Brain: Diffusion imaging does not show any acute or subacute infarction. There chronic small-vessel ischemic changes of the pons, with some hemosiderin deposition. Few old small vessel cerebellar infarctions. Cerebral hemispheres show extensive chronic small-vessel ischemic changes affecting the thalami, basal ganglia and hemispheric white matter. No cortical or large vessel territory infarction. No mass lesion, hemorrhage, hydrocephalus or extra-axial collection. Vascular: Major vessels at the base of the brain show flow. Skull and upper cervical spine: Negative Sinuses/Orbits: Mucoid material in the right sphenoid sinus. Orbits negative. Other: None MRA HEAD FINDINGS Both internal carotid arteries are patent through the skullbase. Right ICA supplies mostly the right MCA territory. Tiny A1 segment. Left ICA supplies the left PCA, the left MCA and both ACA territories. There is distal vessel atherosclerotic narrowing in the MCA branches. Right vertebral artery terminates in PICA. Left vertebral artery supplies the basilar. No basilar stenosis. Posterior circulation branch vessels are patent. Atherosclerotic narrowing in the more distal PCA branches. IMPRESSION: No acute or subacute infarction. Extensive chronic small vessel ischemic changes throughout the brain as outlined above. Intracranial MR angiography does not show any large or medium vessel occlusion or correctable proximal stenosis. Distal vessel atherosclerotic irregularity. Electronically Signed   By: Nelson Chimes M.D.   On: 09/28/2016 16:19   Ct Head Code Stroke W/o Cm  Result Date: 09/28/2016 CLINICAL DATA:   Code stroke.  Slurred speech beginning 1 hour ago. EXAM: CT HEAD WITHOUT CONTRAST TECHNIQUE: Contiguous axial images were obtained from the base of the skull through the vertex without intravenous contrast. COMPARISON:  None. FINDINGS: Brain: Mild generalized brain atrophy. Chronic small-vessel ischemic changes of the hemispheric white matter. No sign of acute infarction, mass lesion, hemorrhage, hydrocephalus or extra-axial collection. Vascular: There is atherosclerotic calcification of the major vessels at the base of the brain. Skull: Negative Sinuses/Orbits: Some inflammatory change in the right division of the sphenoid sinus. Orbits negative. Other: None ASPECTS ([place] Stroke Program Early CT Score) - Ganglionic level infarction (caudate, lentiform nuclei, internal capsule, insula, M1-M3 cortex): 7 - Supraganglionic infarction (M4-M6 cortex): 3 Total score (0-10 with 10 being normal): 10 IMPRESSION: 1. No acute finding by CT. Atrophy and chronic small-vessel ischemic change of the white matter. 2. ASPECTS is 10. These results were called by telephone at the time of interpretation on 09/28/2016 at 12:40 pm to Dr. Harvest Dark , who verbally acknowledged these results. Electronically Signed   By: Nelson Chimes M.D.   On: 09/28/2016 12:41       Today   Subjective:   Rebecka Apley  patient is feeling back to baseline  Objective:   Blood pressure (!) 183/78, pulse 77, temperature 97.7 F (36.5 C), temperature source Oral, resp. rate 16, height 5\' 8"  (1.727 m), weight 122 lb 11.2 oz (55.7 kg), SpO2 95 %.  . No intake or output data in the 24 hours ending 10/02/2016 1338  Exam VITAL SIGNS: Blood pressure (!) 183/78, pulse 77, temperature 97.7 F (36.5 C), temperature source Oral, resp. rate 16, height 5\' 8"  (1.727 m), weight 122 lb 11.2 oz (55.7 kg), SpO2 95 %.  GENERAL:  83 y.o.-year-old patient lying in the bed with no acute distress.  EYES: Pupils equal, round, reactive to light and  accommodation. No scleral icterus. Extraocular muscles intact.  HEENT: Head atraumatic, normocephalic. Oropharynx and nasopharynx clear.  NECK:  Supple, no jugular venous distention. No thyroid enlargement, no tenderness.  LUNGS: Normal breath sounds bilaterally, no wheezing, rales,rhonchi or crepitation. No use of accessory muscles of respiration.  CARDIOVASCULAR: S1, S2 normal. No murmurs, rubs, or gallops.  ABDOMEN: Soft, nontender, nondistended. Bowel sounds present. No organomegaly or mass.  EXTREMITIES: No pedal edema, cyanosis, or clubbing.  NEUROLOGIC: Cranial nerves II through XII are intact. Muscle strength 5/5 in all extremities. Sensation intact. Gait not checked.  PSYCHIATRIC: The patient is alert and oriented x 3.  SKIN: No obvious rash, lesion, or ulcer.   Data Review     CBC w Diff:  Lab Results  Component Value Date   WBC 5.3 10/14/2016   HGB 13.3 09/27/2016   HCT 38.1 10/22/2016   HCT 38.9 08/14/2016   PLT 237 10/24/2016   PLT 258 08/14/2016   LYMPHOPCT 26 09/28/2016   MONOPCT 8 09/28/2016   EOSPCT 1 09/28/2016   BASOPCT 0 09/28/2016   CMP:  Lab Results  Component Value Date   NA 128 (L) 10/22/2016   NA 134 08/14/2016   K 4.2 09/27/2016   CL 92 (L) 09/28/2016   CO2 29 10/14/2016   BUN 23 (H) 10/24/2016   BUN 20 08/14/2016   CREATININE 1.02 (H) 10/11/2016   GLU 88 09/11/2014   PROT 8.1 09/28/2016   PROT 7.1 08/14/2016   ALBUMIN 4.7 09/28/2016   ALBUMIN 4.4 08/14/2016   BILITOT 0.9 09/28/2016   BILITOT 0.5 08/14/2016   ALKPHOS 98 09/28/2016   AST 19 09/28/2016   ALT 12 (L) 09/28/2016  .  Micro Results No results found for this or any previous visit (from the past 240 hour(s)).      Code Status Orders        Start     Ordered   09/28/16 1614  Full code  Continuous     09/28/16 1614    Code Status History    Date Active Date Inactive Code Status Order ID Comments User Context   This patient has a current code status but no historical  code status.          Follow-up Information    Vladimir Crofts, MD. Go on 10/03/2016.   Specialty:  Neurology Why:  @9 :15 AM Contact information: [facility] [place] [address] [phone]        Mar Daring, PA-C. Go on 10/06/2016.   Specialty:  Family Medicine Why:  @11 :00 AM Contact information: [place] [address] [phone]           Discharge Medications   Allergies as of 10/23/2016      Reactions   Naproxen Swelling   Had lip swelling 20 yrs ago - not sure if caused by oxycodone or naproxen   Oxycodone-aspirin Swelling   Had lip swelling 20 yrs ago. Not sure if caused by oxycodone or naproxen.      Medication List    STOP taking these medications   aspirin 81 MG tablet     TAKE these medications   amLODipine 10 MG tablet Commonly known as:  NORVASC Take 1 tablet (10 mg total) by mouth daily.   atorvastatin 40 MG tablet Commonly known as:  LIPITOR Take 1 tablet (40 mg total) by mouth daily at 6 PM.   citalopram 20 MG tablet Commonly known as:  CELEXA TAKE ONE TABLET BY MOUTH ONCE  DAILY   clopidogrel 75 MG tablet Commonly known as:  PLAVIX Take 1 tablet (75 mg total) by mouth daily.   Fish Oil 1200 MG Caps Take 1 capsule by mouth every other day.   fluticasone 50 MCG/ACT nasal spray Commonly known as:  FLONASE INSTILL 2 SPRAYS INTO EACH NOSTRIL DAILY   meloxicam 7.5 MG tablet Commonly known as:  MOBIC Take 1 tablet (7.5 mg total) by mouth daily.   multivitamin tablet Take 1 tablet by mouth daily.   simvastatin 20 MG tablet Commonly known as:  ZOCOR TAKE 1 TABLET BY MOUTH EVERY EVENING   traMADol 50 MG tablet Commonly known as:  ULTRAM Take 1 tablet (50 mg total) by mouth every 6 (six) hours as needed.   triamterene-hydrochlorothiazide 37.5-25 MG tablet Commonly known as:  MAXZIDE-25 Take 0.5 tablets by mouth daily. What changed:  how much to  take   vitamin E 100 UNIT capsule Take 2 capsules (200 Units total) by mouth daily.          Total Time in preparing paper work, data evaluation and todays exam - 35 minutes  Dustin Flock M.D on 10/01/2016 at 1:38 PM  [facility]   Office  [phone]

## 2016-09-29 NOTE — Evaluation (Signed)
Physical Therapy Evaluation Patient Details Name: WILHELMINA HARK MRN: [id number] DOB: [birth year] Today's Date: 09/28/2016   History of Present Illness  presneted to ER with acute onset of word-finding difficulty; admitted for TIA/CVA work-up.  Head CT, MRI negative for acute pathology.  Clinical Impression  Upon evaluation, patient alert and oriented to basic information; follows simple commands, but demonstrates limited insight to integrate new learning.  Bilat UE/LE strength nd ROM grossly symmetrical and WFL; no focal weakness or sensory deficit appreciated.  Able to complete bed mobility with mod indep; sit/stand, basic transfers and gait (20' x2) with L HHA/min assist.  Very short, choppy steps with noted impairment in dynamic balance reactions.  Mobility does improve to cga/close sup with use of RW; do recommend continued use with all mobiltiy at this time.  Patient/family voiced understanding and agreement. Would benefit from skilled PT to address above deficits and promote optimal return to PLOF; Recommend transition to [place] upon discharge from acute hospitalization.     Follow Up Recommendations Home health PT    Equipment Recommendations  Rolling walker with 5" wheels    Recommendations for Other Services       Precautions / Restrictions Precautions Precautions: Fall Restrictions Weight Bearing Restrictions: No      Mobility  Bed Mobility Overal bed mobility: Modified Independent                Transfers Overall transfer level: Needs assistance Equipment used: None Transfers: Sit to/from Stand Sit to Stand: Min assist            Ambulation/Gait Ambulation/Gait assistance: Min assist Ambulation Distance (Feet): 20 Feet (x2) Assistive device: 1 person hand held assist       General Gait Details: short, choppy steps with poor heel strike/toe off; broad BOS with increased lateral sway.  Frequent min assist to prevent bilat LOB.  Generally staggered and  unsteady  Financial trader Rankin (Stroke Patients Only)       Balance Overall balance assessment: Needs assistance Sitting-balance support: No upper extremity supported;Feet supported Sitting balance-Leahy Scale: Good     Standing balance support: No upper extremity supported Standing balance-Leahy Scale: Poor                               Pertinent Vitals/Pain Pain Assessment: No/denies pain    Home Living Family/patient expects to be discharged to:: Private residence   Available Help at Discharge: Family;Available 24 hours/day Type of Home: House       Home Layout: Two level;Able to live on main level with bedroom/bathroom Home Equipment: Walker - 2 wheels Additional Comments: full flight of stairs with bilat rails to access garage of home (common entrance/exit    Prior Function Level of Independence: Independent         Comments: Indep with ADLS, household activities; endorses 2-3 falls within previous six months     Hand Dominance        Extremity/Trunk Assessment   Upper Extremity Assessment Upper Extremity Assessment: Overall WFL for tasks assessed    Lower Extremity Assessment Lower Extremity Assessment: Overall WFL for tasks assessed (grossly 4+/5 throughout without focal weakness, sensory deficit)       Communication      Cognition Arousal/Alertness: Awake/alert Behavior During Therapy: WFL for tasks assessed/performed  General Comments: mild difficulty following multi-step commands; limited recall of new information evident      General Comments      Exercises Other Exercises Other Exercises: Toilet tranfser, ambulatory with HHA, min assist.  Decrease stability during turn negotiation and obstacle management Other Exercises: Worked on lumbar extension, anterior weight required for safe sit/stand transfer (due to limited lumbar/pelvic  mobility noted).  With cuing, able to improve sit/stand to close sup wiht RW; unable to fully  carry-overu without direct cuing from therapist  Gait x210' with RW, cga/close sup-improved fluidity and overall safety   Assessment/Plan    PT Assessment Patient needs continued PT services  PT Problem List Decreased range of motion;Decreased activity tolerance;Decreased balance;Decreased mobility;Decreased coordination;Decreased cognition;Decreased knowledge of use of DME;Decreased safety awareness;Decreased knowledge of precautions       PT Treatment Interventions DME instruction;Gait training;Stair training;Functional mobility training;Therapeutic activities;Therapeutic exercise;Balance training    PT Goals (Current goals can be found in the Care Plan section)  Acute Rehab PT Goals Patient Stated Goal: to go home as soon as they will let me PT Goal Formulation: With patient/family Time For Goal Achievement: 10/13/16 Potential to Achieve Goals: Fair    Frequency Min 2X/week   Barriers to discharge        Co-evaluation               AM-PAC PT "6 Clicks" Daily Activity  Outcome Measure Difficulty turning over in bed (including adjusting bedclothes, sheets and blankets)?: None Difficulty moving from lying on back to sitting on the side of the bed? : None Difficulty sitting down on and standing up from a chair with arms (e.g., wheelchair, bedside commode, etc,.)?: Total Help needed moving to and from a bed to chair (including a wheelchair)?: A Little Help needed walking in hospital room?: A Little Help needed climbing 3-5 steps with a railing? : A Little 6 Click Score: 18    End of Session Equipment Utilized During Treatment: Gait belt Activity Tolerance: Patient tolerated treatment well Patient left: in bed;with call bell/phone within reach;with bed alarm set;with family/visitor present Nurse Communication: Mobility status PT Visit Diagnosis: Difficulty in walking, not  elsewhere classified (R26.2);History of falling (Z91.81)    Time: 1130-1203 PT Time Calculation (min) (ACUTE ONLY): 33 min   Charges:   PT Evaluation $PT Eval Low Complexity: 1 Procedure PT Treatments $Therapeutic Activity: 8-22 mins   PT G Codes:   PT G-Codes **NOT FOR INPATIENT CLASS** Functional Assessment Tool Used: AM-PAC 6 Clicks Basic Mobility Functional Limitation: Mobility: Walking and moving around Mobility: Walking and Moving Around Current Status (P5916): At least 40 percent but less than 60 percent impaired, limited or restricted Mobility: Walking and Moving Around Goal Status [phone]): At least 1 percent but less than 20 percent impaired, limited or restricted    Elgene Coral H. Owens Shark, PT, DPT, NCS 10/05/2016, 11:37 PM [phone]

## 2016-09-29 NOTE — Progress Notes (Signed)
SLP Cancellation Note  Patient Details Name: Becky Prince MRN: [id number] DOB: 1925/05/24   Cancelled treatment:       Reason Eval/Treat Not Completed: SLP screened, no needs identified, will sign off (chart reviewed; consulted pt/family then NSG re: status). Pt denied any difficulty swallowing and is currently on a regular diet; tolerates swallowing pills w/ water per NSG. Pt conversed at conversational level w/out deficits noted; pt and family denied any speech-language deficits.  No further skilled ST services indicated as pt appears at her baseline. Pt agreed. NSG to reconsult if any change in status.     Orinda Kenner, [place], CCC-SLP [employer] 09/25/2016, 12:06 PM

## 2016-09-29 NOTE — Telephone Encounter (Signed)
Opened in error. Thanks TNP

## 2016-09-30 NOTE — Telephone Encounter (Signed)
Transition Care Management Follow-up Telephone Call    Date discharged? 09/26/2016  How have you been since you were released from the hospital? Recovering, still has some pain in the lower legs (but not as bad as before), weak, but moving more often.  Any patient concerns? None   Items Reviewed:  Medications reviewed: No, husband did not have the updated list in front of him. He will update our records and pts next OV on 10/06/16.  Allergies reviewed: Yes  Dietary changes reviewed: N/A  Referrals reviewed: Yes, Dr. Manuella Ghazi (neurologist) on 10/02/16   Functional Questionnaire:  Independent - I Dependent - D    Activities of Daily Living (ADLs):    Personal hygiene - I Dressing - I Eating - I Maintaining continence - I Transferring - I   Independent Activities of Daily Living (iADLs): Basic communication skills - I Transportation - D Meal preparation - I Shopping - D Housework - D Managing medications - I  Managing personal finances - I   Confirmed importance and date/time of follow-up visits scheduled YES  Provider Appointment booked with PCP 10/06/16 @ 4:00 PM.  Confirmed with patient if condition begins to worsen call PCP or go to the ER.  Patient was given the office number and encouraged to call back with question or concerns: YES

## 2016-09-30 NOTE — Telephone Encounter (Signed)
Spoke with pt's husband Timmothy Sours and rescheduled appt from [place] to Dr. Caryn Section at 4 pm on 10/06/16. Thanks TNP

## 2016-10-02 DIAGNOSIS — F0150 Vascular dementia without behavioral disturbance: Secondary | ICD-10-CM | POA: Diagnosis not present

## 2016-10-02 DIAGNOSIS — E041 Nontoxic single thyroid nodule: Secondary | ICD-10-CM | POA: Diagnosis not present

## 2016-10-02 DIAGNOSIS — I272 Pulmonary hypertension, unspecified: Secondary | ICD-10-CM | POA: Diagnosis not present

## 2016-10-02 DIAGNOSIS — Z791 Long term (current) use of non-steroidal anti-inflammatories (NSAID): Secondary | ICD-10-CM | POA: Diagnosis not present

## 2016-10-02 DIAGNOSIS — F0280 Dementia in other diseases classified elsewhere without behavioral disturbance: Secondary | ICD-10-CM | POA: Diagnosis not present

## 2016-10-02 DIAGNOSIS — G309 Alzheimer's disease, unspecified: Secondary | ICD-10-CM | POA: Diagnosis not present

## 2016-10-02 DIAGNOSIS — R4701 Aphasia: Secondary | ICD-10-CM | POA: Diagnosis not present

## 2016-10-02 DIAGNOSIS — F329 Major depressive disorder, single episode, unspecified: Secondary | ICD-10-CM | POA: Diagnosis not present

## 2016-10-02 DIAGNOSIS — I361 Nonrheumatic tricuspid (valve) insufficiency: Secondary | ICD-10-CM | POA: Diagnosis not present

## 2016-10-02 DIAGNOSIS — Z7902 Long term (current) use of antithrombotics/antiplatelets: Secondary | ICD-10-CM | POA: Diagnosis not present

## 2016-10-02 DIAGNOSIS — I10 Essential (primary) hypertension: Secondary | ICD-10-CM | POA: Diagnosis not present

## 2016-10-02 DIAGNOSIS — F99 Mental disorder, not otherwise specified: Secondary | ICD-10-CM | POA: Diagnosis not present

## 2016-10-02 DIAGNOSIS — R29898 Other symptoms and signs involving the musculoskeletal system: Secondary | ICD-10-CM | POA: Diagnosis not present

## 2016-10-06 ENCOUNTER — Inpatient Hospital Stay: Payer: PPO | Admitting: Physician Assistant

## 2016-10-06 ENCOUNTER — Ambulatory Visit (INDEPENDENT_AMBULATORY_CARE_PROVIDER_SITE_OTHER): Payer: PPO | Admitting: Family Medicine

## 2016-10-06 ENCOUNTER — Encounter: Payer: Self-pay | Admitting: Family Medicine

## 2016-10-06 DIAGNOSIS — E041 Nontoxic single thyroid nodule: Secondary | ICD-10-CM | POA: Diagnosis not present

## 2016-10-06 DIAGNOSIS — E042 Nontoxic multinodular goiter: Secondary | ICD-10-CM | POA: Insufficient documentation

## 2016-10-06 DIAGNOSIS — I679 Cerebrovascular disease, unspecified: Secondary | ICD-10-CM

## 2016-10-06 DIAGNOSIS — E871 Hypo-osmolality and hyponatremia: Secondary | ICD-10-CM | POA: Diagnosis not present

## 2016-10-06 NOTE — Progress Notes (Signed)
Patient: Becky Prince Female    DOB: 10-25-1925   83 y.o.   MRN: [id number] Visit Date: 10/06/2016  Today's Provider: Lelon Huh, MD   Chief Complaint  Patient presents with  . Hospitalization Follow-up   Subjective:    HPI  Follow up Hospitalization  Patient was admitted to [facility] on 09/28/2016 and discharged on 10/03/2016. She presented to ER complaining of difficulty with word finding and speech with some numbness in her lipids.  She was treated for TIA. Per discharge summary: CT and MRI was negative for acute infarction, but MRI showed few old small vessel cerebellar infarctions. Cerebral hemispheres show extensive chronic small-vessel ischemic changes affecting the thalami, basal ganglia and hemispheric white matter Echocardiogram did show tricuspid regurg and pulmonary hypertension.  Carotid dopplers showed mild bilateral carotid atherosclerosis. No HD significant stenosis.   Patients blood pressure was elevated and she was started in amlodipine and taken off of metoprolol .She was also noted to be hyponatremic  She was changed from simvastatin to atorvastatin and started on clopidogrel   Patient was advised to keep a log of her blood pressure readings and to bring them to today's office visit.  Telephone follow up was done on 10/10/2016 She reports good compliance with treatment. She reports this condition is Improved.Patient states she sometimes feels lightheaded. Patient has been checking blood pressures twice a day and the readings have averaged 962-952 (systolic) over 84-13 (Diastolic). Since starting new medications patient has had itchy skin. Numbness of her lips has resolved, but her husband thinks she still mumbles a bit when speaking. She is scheduled for follow up with Dr. Manuella Ghazi on 10/10/2016.  She was also noted to have 1.6cm right thyroid nodule on carotid ultrasound  ------------------------------------------------------------------------------------  BP  Readings from Last 3 Encounters:  10/06/16 124/64  10/10/2016 (!) 183/78  08/14/16 130/80    Lab Results  Component Value Date   CHOL 176 09/28/2016   CHOL 170 02/13/2016   CHOL 162 09/11/2014   Lab Results  Component Value Date   HDL 68 09/28/2016   HDL 61 02/13/2016   HDL 50 09/11/2014   Lab Results  Component Value Date   LDLCALC 82 09/28/2016   LDLCALC 83 02/13/2016   LDLCALC 76 09/11/2014   Lab Results  Component Value Date   TRIG 131 09/28/2016   TRIG 128 02/13/2016   TRIG 178 (A) 09/11/2014   Lab Results  Component Value Date   CHOLHDL 2.6 09/28/2016   CHOLHDL 2.8 02/13/2016   No results found for: LDLDIRECT     Allergies  Allergen Reactions  . Naproxen Swelling    Had lip swelling 20 yrs ago - not sure if caused by oxycodone or naproxen  . Oxycodone-Aspirin Swelling    Had lip swelling 20 yrs ago. Not sure if caused by oxycodone or naproxen.  . Prednisone Diarrhea     Current Outpatient Prescriptions:  .  amLODipine (NORVASC) 10 MG tablet, Take 1 tablet (10 mg total) by mouth daily., Disp: 30 tablet, Rfl: 11 .  atorvastatin (LIPITOR) 40 MG tablet, Take 1 tablet (40 mg total) by mouth daily at 6 PM., Disp: 30 tablet, Rfl: 0 .  citalopram (CELEXA) 20 MG tablet, TAKE ONE TABLET BY MOUTH ONCE DAILY, Disp: 90 tablet, Rfl: 1 .  clopidogrel (PLAVIX) 75 MG tablet, Take 1 tablet (75 mg total) by mouth daily., Disp: 30 tablet, Rfl: 11 .  feeding supplement, ENSURE ENLIVE, (ENSURE ENLIVE) LIQD, Take 237  mLs by mouth 2 (two) times daily between meals., Disp: 237 mL, Rfl: 12 .  fluticasone (FLONASE) 50 MCG/ACT nasal spray, INSTILL 2 SPRAYS INTO EACH NOSTRIL DAILY, Disp: 48 g, Rfl: 1 .  meloxicam (MOBIC) 7.5 MG tablet, Take 1 tablet (7.5 mg total) by mouth daily., Disp: 90 tablet, Rfl: 1 .  Multiple Vitamins-Minerals (MULTIVITAMIN) tablet, Take 1 tablet by mouth daily., Disp: 30 tablet, Rfl: 0 .  Omega-3 Fatty Acids (FISH OIL) 1200 MG CAPS, Take 1 capsule by mouth  every other day. , Disp: 60 capsule, Rfl: 0 .  traMADol (ULTRAM) 50 MG tablet, Take 1 tablet (50 mg total) by mouth every 6 (six) hours as needed., Disp: 30 tablet, Rfl: 0 .  triamterene-hydrochlorothiazide (MAXZIDE-25) 37.5-25 MG tablet, Take 0.5 tablets by mouth daily. (Patient taking differently: Take 1 tablet by mouth daily. ), Disp: 45 tablet, Rfl: 3 .  vitamin E 100 UNIT capsule, Take 2 capsules (200 Units total) by mouth daily., Disp: 60 capsule, Rfl: 0  Review of Systems  Constitutional: Negative for appetite change, chills, fatigue and fever.  Respiratory: Negative for chest tightness and shortness of breath.   Cardiovascular: Negative for chest pain, palpitations and leg swelling.  Gastrointestinal: Negative for abdominal pain, nausea and vomiting.  Skin:       Itchy skin  Neurological: Positive for light-headedness and headaches. Negative for dizziness and weakness.  Hematological: Bruises/bleeds easily.    Social History  Substance Use Topics  . Smoking status: Never Smoker  . Smokeless tobacco: Never Used  . Alcohol use No   Objective:   BP 124/64 (BP Location: Left Arm, Patient Position: Sitting, Cuff Size: Normal)   Pulse 89   Temp 97.8 F (36.6 C) (Oral)   Resp 16   SpO2 95% Comment: room air There were no vitals filed for this visit.   Physical Exam   General Appearance:    Alert, cooperative, no distress  Eyes:    PERRL, conjunctiva/corneas clear, EOM's intact       Lungs:     Clear to auscultation bilaterally, respirations unlabored  Heart:    Regular rate and rhythm  Neurologic:   Awake, alert, oriented x 3. No apparent focal neurological           defect.           Assessment & Plan:     1. Hyponatremia Consider changing diuretic if not improved.  - Comprehensive metabolic panel  2. Cerebrovascular disease Follow up TIA. Mostly resolve. Tolerating statin change. Follow up neuro as scheduled.  - Comprehensive metabolic panel - CBC  3. Thyroid  nodule Incidental finding. Defer further evaluation until neuro workup is complete.        Lelon Huh, MD  [facility]

## 2016-10-06 NOTE — Patient Instructions (Signed)
Start taking OTC Conezyme Q10 200mg  daily.

## 2016-10-07 ENCOUNTER — Other Ambulatory Visit: Payer: Self-pay

## 2016-10-07 LAB — CBC
HEMATOCRIT: 41.9 % (ref 34.0–46.6)
HEMOGLOBIN: 14.4 g/dL (ref 11.1–15.9)
MCH: 31.7 pg (ref 26.6–33.0)
MCHC: 34.4 g/dL (ref 31.5–35.7)
MCV: 92 fL (ref 79–97)
Platelets: 314 10*3/uL (ref 150–379)
RBC: 4.54 x10E6/uL (ref 3.77–5.28)
RDW: 12.7 % (ref 12.3–15.4)
WBC: 9.1 10*3/uL (ref 3.4–10.8)

## 2016-10-07 LAB — COMPREHENSIVE METABOLIC PANEL
ALT: 18 IU/L (ref 0–32)
AST: 17 IU/L (ref 0–40)
Albumin/Globulin Ratio: 1.5 (ref 1.2–2.2)
Albumin: 4.5 g/dL (ref 3.5–4.7)
Alkaline Phosphatase: 113 IU/L (ref 39–117)
BUN/Creatinine Ratio: 22 (ref 12–28)
BUN: 29 mg/dL — AB (ref 8–27)
Bilirubin Total: 0.4 mg/dL (ref 0.0–1.2)
CALCIUM: 10 mg/dL (ref 8.7–10.3)
CO2: 25 mmol/L (ref 20–29)
CREATININE: 1.33 mg/dL — AB (ref 0.57–1.00)
Chloride: 87 mmol/L — ABNORMAL LOW (ref 96–106)
GFR, EST AFRICAN AMERICAN: 41 mL/min/{1.73_m2} — AB (ref >59)
GFR, EST NON AFRICAN AMERICAN: 36 mL/min/{1.73_m2} — AB (ref >59)
GLUCOSE: 105 mg/dL — AB (ref 65–99)
Globulin, Total: 3.1 g/dL (ref 1.5–4.5)
Potassium: 4 mmol/L (ref 3.5–5.2)
Sodium: 131 mmol/L — ABNORMAL LOW (ref 134–144)
TOTAL PROTEIN: 7.6 g/dL (ref 6.0–8.5)

## 2016-10-07 MED ORDER — SPIRONOLACTONE 25 MG PO TABS: 25.0000 mg | ORAL_TABLET | Freq: Every day | ORAL | 5 refills | Status: DC

## 2016-10-07 NOTE — Progress Notes (Signed)
Patient advised, medication list updated, medication sent to pharmacy and follow up appointment has been made.  ED

## 2016-10-08 ENCOUNTER — Telehealth: Payer: Self-pay | Admitting: Physician Assistant

## 2016-10-08 NOTE — Telephone Encounter (Signed)
Please advise she is not supposed to be taking simvastatin. She was changed to atorvastatin in the hospital. [facility] for home health to order speech therapy.

## 2016-10-08 NOTE — Telephone Encounter (Signed)
LMTCB ED 

## 2016-10-08 NOTE — Telephone Encounter (Signed)
She has transferred care to Dr. Caryn Section after hospitalization per request of her children to have an MD over her care.  Dr. Caryn Section please see note below.

## 2016-10-08 NOTE — Telephone Encounter (Signed)
Advised  ED 

## 2016-10-08 NOTE — Telephone Encounter (Signed)
Please advise 

## 2016-10-08 NOTE — Telephone Encounter (Addendum)
Raquel Sarna with [facility] called to advive pt has a level 1 drug interaction for amLODipine and simvastatin that she needs Dr Caryn Section to be aware of.    Raquel Sarna is also requesting a verbal order for speech therapy.      CB#[phone]/MW

## 2016-10-09 DIAGNOSIS — Z8639 Personal history of other endocrine, nutritional and metabolic disease: Secondary | ICD-10-CM | POA: Diagnosis not present

## 2016-10-09 DIAGNOSIS — E041 Nontoxic single thyroid nodule: Secondary | ICD-10-CM | POA: Diagnosis not present

## 2016-10-10 DIAGNOSIS — F0280 Dementia in other diseases classified elsewhere without behavioral disturbance: Secondary | ICD-10-CM | POA: Diagnosis not present

## 2016-10-10 DIAGNOSIS — F99 Mental disorder, not otherwise specified: Secondary | ICD-10-CM | POA: Diagnosis not present

## 2016-10-10 DIAGNOSIS — G309 Alzheimer's disease, unspecified: Secondary | ICD-10-CM | POA: Diagnosis not present

## 2016-10-10 DIAGNOSIS — F0150 Vascular dementia without behavioral disturbance: Secondary | ICD-10-CM | POA: Diagnosis not present

## 2016-10-14 DIAGNOSIS — I361 Nonrheumatic tricuspid (valve) insufficiency: Secondary | ICD-10-CM | POA: Diagnosis not present

## 2016-10-14 DIAGNOSIS — I272 Pulmonary hypertension, unspecified: Secondary | ICD-10-CM | POA: Diagnosis not present

## 2016-10-14 DIAGNOSIS — I10 Essential (primary) hypertension: Secondary | ICD-10-CM | POA: Diagnosis not present

## 2016-10-14 DIAGNOSIS — F329 Major depressive disorder, single episode, unspecified: Secondary | ICD-10-CM | POA: Diagnosis not present

## 2016-10-21 DIAGNOSIS — E041 Nontoxic single thyroid nodule: Secondary | ICD-10-CM | POA: Diagnosis not present

## 2016-10-25 ENCOUNTER — Other Ambulatory Visit: Payer: Self-pay | Admitting: Physician Assistant

## 2016-10-25 NOTE — Death Summary Note (Deleted)
Becky Prince, 83 y.o., DOB [birth year], MRN [id number]. Admission date: 09/28/2016 Discharge Date 10/17/2016 Primary MD Mar Daring, PA-C Admitting Physician Vaughan Basta, MD  Admission Diagnosis  Cerebral infarction [facility]) [I63.9] CVA (cerebral infarction) [I63.9] Transient cerebral ischemia, unspecified type [G45.9]  Discharge Diagnosis   Principal Problem:   TIA (transient ischemic attack)  Accelerated hypertension Hyperlipidemia Depression Moderate tricuspid regurg Pulmonary hypertension Hyponatremia likely due to diuretics    [place]  is a 83 y.o. female with a known history of Depression, hypercholesterolemia, hypertension- lives with her husband and able to take care of herself at baseline. Today morning she was fine when she woke up at around 11 when daughter was visiting her she noted she has difficulty in finding words and SPEECH. Patient also felt some numbness on her lips but denied any other symptoms. Patient presented with these symptoms. Initial CT scan was negative. She was admitted for stroke rule out. Further evaluation showed MRI to be negative. Patient's echocardiogram did show some tricuspid regurg and pulmonary hypertension. Her blood pressure is elevated therefore she'll be started on Norvasc. I have recommended that she keep a log of her blood pressure to take to her primary care so medications can be adjusted. Due to her hyponatremia would try to discontinue HCTZ Triaminic training in the future. Patient does have some memory issues and was recommended by neurology to follow up as outpatient for possible evaluation for dementia            Consults  neurology  Significant Tests:  See full reports for all details     Mr Becky Prince Head Wo Contrast  Result Date: 09/28/2016 CLINICAL DATA:  Acute presentation with speech disturbance this morning. EXAM: MRI HEAD WITHOUT CONTRAST  MRA HEAD WITHOUT CONTRAST TECHNIQUE: Multiplanar, multiecho pulse sequences of the brain and surrounding structures were obtained without intravenous contrast. Angiographic images of the head were obtained using MRA technique without contrast. COMPARISON:  CT earlier same day FINDINGS: MRI HEAD FINDINGS Brain: Diffusion imaging does not show any acute or subacute infarction. There chronic small-vessel ischemic changes of the pons, with some hemosiderin deposition. Few old small vessel cerebellar infarctions. Cerebral hemispheres show extensive chronic small-vessel ischemic changes affecting the thalami, basal ganglia and hemispheric white matter. No cortical or large vessel territory infarction. No mass lesion, hemorrhage, hydrocephalus or extra-axial collection. Vascular: Major vessels at the base of the brain show flow. Skull and upper cervical spine: Negative Sinuses/Orbits: Mucoid material in the right sphenoid sinus. Orbits negative. Other: None MRA HEAD FINDINGS Both internal carotid arteries are patent through the skullbase. Right ICA supplies mostly the right MCA territory. Tiny A1 segment. Left ICA supplies the left PCA, the left MCA and both ACA territories. There is distal vessel atherosclerotic narrowing in the MCA branches. Right vertebral artery terminates in PICA. Left vertebral artery supplies the basilar. No basilar stenosis. Posterior circulation branch vessels are patent. Atherosclerotic narrowing in the more distal PCA branches. IMPRESSION: No acute or subacute infarction. Extensive chronic small vessel ischemic changes throughout the brain as outlined above. Intracranial MR angiography does not show any large or medium vessel occlusion or correctable proximal stenosis. Distal vessel atherosclerotic irregularity. Electronically Signed   By: Nelson Chimes M.D.   On: 09/28/2016 16:19   Mr Brain Wo Contrast  Result Date: 09/28/2016 CLINICAL DATA:  Acute presentation with speech disturbance this  morning. EXAM: MRI HEAD WITHOUT CONTRAST MRA HEAD WITHOUT  CONTRAST TECHNIQUE: Multiplanar, multiecho pulse sequences of the brain and surrounding structures were obtained without intravenous contrast. Angiographic images of the head were obtained using MRA technique without contrast. COMPARISON:  CT earlier same day FINDINGS: MRI HEAD FINDINGS Brain: Diffusion imaging does not show any acute or subacute infarction. There chronic small-vessel ischemic changes of the pons, with some hemosiderin deposition. Few old small vessel cerebellar infarctions. Cerebral hemispheres show extensive chronic small-vessel ischemic changes affecting the thalami, basal ganglia and hemispheric white matter. No cortical or large vessel territory infarction. No mass lesion, hemorrhage, hydrocephalus or extra-axial collection. Vascular: Major vessels at the base of the brain show flow. Skull and upper cervical spine: Negative Sinuses/Orbits: Mucoid material in the right sphenoid sinus. Orbits negative. Other: None MRA HEAD FINDINGS Both internal carotid arteries are patent through the skullbase. Right ICA supplies mostly the right MCA territory. Tiny A1 segment. Left ICA supplies the left PCA, the left MCA and both ACA territories. There is distal vessel atherosclerotic narrowing in the MCA branches. Right vertebral artery terminates in PICA. Left vertebral artery supplies the basilar. No basilar stenosis. Posterior circulation branch vessels are patent. Atherosclerotic narrowing in the more distal PCA branches. IMPRESSION: No acute or subacute infarction. Extensive chronic small vessel ischemic changes throughout the brain as outlined above. Intracranial MR angiography does not show any large or medium vessel occlusion or correctable proximal stenosis. Distal vessel atherosclerotic irregularity. Electronically Signed   By: Nelson Chimes M.D.   On: 09/28/2016 16:19   Ct Head Code Stroke W/o Cm  Result Date: 09/28/2016 CLINICAL DATA:   Code stroke.  Slurred speech beginning 1 hour ago. EXAM: CT HEAD WITHOUT CONTRAST TECHNIQUE: Contiguous axial images were obtained from the base of the skull through the vertex without intravenous contrast. COMPARISON:  None. FINDINGS: Brain: Mild generalized brain atrophy. Chronic small-vessel ischemic changes of the hemispheric white matter. No sign of acute infarction, mass lesion, hemorrhage, hydrocephalus or extra-axial collection. Vascular: There is atherosclerotic calcification of the major vessels at the base of the brain. Skull: Negative Sinuses/Orbits: Some inflammatory change in the right division of the sphenoid sinus. Orbits negative. Other: None ASPECTS ([place] Stroke Program Early CT Score) - Ganglionic level infarction (caudate, lentiform nuclei, internal capsule, insula, M1-M3 cortex): 7 - Supraganglionic infarction (M4-M6 cortex): 3 Total score (0-10 with 10 being normal): 10 IMPRESSION: 1. No acute finding by CT. Atrophy and chronic small-vessel ischemic change of the white matter. 2. ASPECTS is 10. These results were called by telephone at the time of interpretation on 09/28/2016 at 12:40 pm to Dr. Harvest Dark , who verbally acknowledged these results. Electronically Signed   By: Nelson Chimes M.D.   On: 09/28/2016 12:41       Today   Subjective:   Becky Prince  patient is feeling back to baseline  Objective:   Blood pressure (!) 171/88, pulse 69, temperature 97.9 F (36.6 C), temperature source Oral, resp. rate 17, height 5\' 8"  (1.727 m), weight 122 lb 11.2 oz (55.7 kg), SpO2 96 %.  . No intake or output data in the 24 hours ending 10/06/2016 1303  Exam VITAL SIGNS: Blood pressure (!) 171/88, pulse 69, temperature 97.9 F (36.6 C), temperature source Oral, resp. rate 17, height 5\' 8"  (1.727 m), weight 122 lb 11.2 oz (55.7 kg), SpO2 96 %.  GENERAL:  83 y.o.-year-old patient lying in the bed with no acute distress.  EYES: Pupils equal, round, reactive to light and  accommodation. No scleral icterus. Extraocular muscles intact.  HEENT: Head atraumatic, normocephalic. Oropharynx and nasopharynx clear.  NECK:  Supple, no jugular venous distention. No thyroid enlargement, no tenderness.  LUNGS: Normal breath sounds bilaterally, no wheezing, rales,rhonchi or crepitation. No use of accessory muscles of respiration.  CARDIOVASCULAR: S1, S2 normal. No murmurs, rubs, or gallops.  ABDOMEN: Soft, nontender, nondistended. Bowel sounds present. No organomegaly or mass.  EXTREMITIES: No pedal edema, cyanosis, or clubbing.  NEUROLOGIC: Cranial nerves II through XII are intact. Muscle strength 5/5 in all extremities. Sensation intact. Gait not checked.  PSYCHIATRIC: The patient is alert and oriented x 3.  SKIN: No obvious rash, lesion, or ulcer.   Data Review     CBC w Diff:  Lab Results  Component Value Date   WBC 5.3 10/15/2016   HGB 13.3 10/21/2016   HCT 38.1 10/02/2016   HCT 38.9 08/14/2016   PLT 237 10/15/2016   PLT 258 08/14/2016   LYMPHOPCT 26 09/28/2016   MONOPCT 8 09/28/2016   EOSPCT 1 09/28/2016   BASOPCT 0 09/28/2016   CMP:  Lab Results  Component Value Date   NA 128 (L) 09/30/2016   NA 134 08/14/2016   K 4.2 10/01/2016   CL 92 (L) 10/12/2016   CO2 29 10/09/2016   BUN 23 (H) 10/04/2016   BUN 20 08/14/2016   CREATININE 1.02 (H) 09/27/2016   GLU 88 09/11/2014   PROT 8.1 09/28/2016   PROT 7.1 08/14/2016   ALBUMIN 4.7 09/28/2016   ALBUMIN 4.4 08/14/2016   BILITOT 0.9 09/28/2016   BILITOT 0.5 08/14/2016   ALKPHOS 98 09/28/2016   AST 19 09/28/2016   ALT 12 (L) 09/28/2016  .  Micro Results No results found for this or any previous visit (from the past 240 hour(s)).      Code Status Orders        Start     Ordered   09/28/16 1614  Full code  Continuous     09/28/16 1614    Code Status History    Date Active Date Inactive Code Status Order ID Comments User Context   This patient has a current code status but no historical  code status.          Follow-up Information    Guadalupe Maple, MD Follow up in 1 week(s).   Specialty:  Family Medicine Why:  as new patient Contact information: [place] [id number] [phone]        Vladimir Crofts, MD Follow up in 2 week(s).   Specialty:  Neurology Why:  memory loss Contact information: [address] [facility] [place] [address] [phone]           Discharge Medications   Allergies as of 10/09/2016      Reactions   Naproxen Swelling   Had lip swelling 20 yrs ago - not sure if caused by oxycodone or naproxen   Oxycodone-aspirin Swelling   Had lip swelling 20 yrs ago. Not sure if caused by oxycodone or naproxen.      Medication List    STOP taking these medications   aspirin 81 MG tablet     TAKE these medications   amLODipine 10 MG tablet Commonly known as:  NORVASC Take 1 tablet (10 mg total) by mouth daily.   atorvastatin 40 MG tablet Commonly known as:  LIPITOR Take 1 tablet (40 mg total) by mouth daily at 6 PM.   citalopram 20 MG tablet Commonly known as:  CELEXA TAKE ONE  TABLET BY MOUTH ONCE DAILY   clopidogrel 75 MG tablet Commonly known as:  PLAVIX Take 1 tablet (75 mg total) by mouth daily.   Fish Oil 1200 MG Caps Take 1 capsule by mouth every other day.   fluticasone 50 MCG/ACT nasal spray Commonly known as:  FLONASE INSTILL 2 SPRAYS INTO EACH NOSTRIL DAILY   meloxicam 7.5 MG tablet Commonly known as:  MOBIC Take 1 tablet (7.5 mg total) by mouth daily.   multivitamin tablet Take 1 tablet by mouth daily.   simvastatin 20 MG tablet Commonly known as:  ZOCOR TAKE 1 TABLET BY MOUTH EVERY EVENING   traMADol 50 MG tablet Commonly known as:  ULTRAM Take 1 tablet (50 mg total) by mouth every 6 (six) hours as needed.   triamterene-hydrochlorothiazide 37.5-25 MG tablet Commonly known as:  MAXZIDE-25 Take 0.5 tablets by mouth daily. What changed:  how much to  take   vitamin E 100 UNIT capsule Take 2 capsules (200 Units total) by mouth daily.          Total Time in preparing paper work, data evaluation and todays exam - 35 minutes  Dustin Flock M.D on 09/30/2016 at 1:03 PM  [facility]   Office  [phone]

## 2016-10-25 DEATH — deceased

## 2016-10-30 ENCOUNTER — Ambulatory Visit (INDEPENDENT_AMBULATORY_CARE_PROVIDER_SITE_OTHER): Payer: PPO | Admitting: Family Medicine

## 2016-10-30 ENCOUNTER — Telehealth: Payer: Self-pay | Admitting: Family Medicine

## 2016-10-30 ENCOUNTER — Encounter: Payer: Self-pay | Admitting: Family Medicine

## 2016-10-30 VITALS — BP 132/64 | HR 76 | Temp 98.2°F | Resp 16 | Ht 68.0 in | Wt 119.0 lb

## 2016-10-30 DIAGNOSIS — E041 Nontoxic single thyroid nodule: Secondary | ICD-10-CM | POA: Diagnosis not present

## 2016-10-30 DIAGNOSIS — R634 Abnormal weight loss: Secondary | ICD-10-CM

## 2016-10-30 DIAGNOSIS — E042 Nontoxic multinodular goiter: Secondary | ICD-10-CM | POA: Diagnosis not present

## 2016-10-30 DIAGNOSIS — E871 Hypo-osmolality and hyponatremia: Secondary | ICD-10-CM

## 2016-10-30 DIAGNOSIS — F329 Major depressive disorder, single episode, unspecified: Secondary | ICD-10-CM

## 2016-10-30 DIAGNOSIS — E7800 Pure hypercholesterolemia, unspecified: Secondary | ICD-10-CM | POA: Diagnosis not present

## 2016-10-30 DIAGNOSIS — I10 Essential (primary) hypertension: Secondary | ICD-10-CM

## 2016-10-30 DIAGNOSIS — R5383 Other fatigue: Secondary | ICD-10-CM

## 2016-10-30 NOTE — Telephone Encounter (Signed)
Patient had EEG done 10-10-16 ordered by [facility] neurology. She asked today if we could get copy of report since she hasn't heard anything from [facility]. Thanks.

## 2016-10-30 NOTE — Telephone Encounter (Signed)
Tried calling neurology department at [facility] and operator said the office is already closed. Will need to try on Monday-aa

## 2016-10-30 NOTE — Progress Notes (Signed)
Patient: Becky Prince Female    DOB: August 10, 1925   83 y.o.   MRN: [id number] Visit Date: 10/30/2016  Today's Provider: Lelon Huh, MD   Chief Complaint  Patient presents with  . hyponatremia    follow up    Subjective:    HPI Patient presents  for a follow up of hyponatremia BMET    Component Value Date/Time   NA 131 (L) 10/06/2016 1654   K 4.0 10/06/2016 1654   CL 87 (L) 10/06/2016 1654   CO2 25 10/06/2016 1654   GLUCOSE 105 (H) 10/06/2016 1654   GLUCOSE 102 (H) 09/25/2016 0525   BUN 29 (H) 10/06/2016 1654   CREATININE 1.33 (H) 10/06/2016 1654   CALCIUM 10.0 10/06/2016 1654   GFRNONAA 36 (L) 10/06/2016 1654   GFRAA 41 (L) 10/06/2016 1654   . She was last seen in the office about 3 weeks ago. She was advised to stop the Triamterene/HCTZ and start Spironolactone 25mg  daily. Patient reports that she is tolerating the med changes well. She does complains of feeling very fatigued, but his has been present for several months. She also reports she has been having trouble keeping weight on despite consuming two Ensure supplements every day in addition to regular meals.   Wt Readings from Last 3 Encounters:  10/30/16 119 lb (54 kg)  09/28/16 122 lb 11.2 oz (55.7 kg)  08/14/16 127 lb (57.6 kg)   She had follow up with Dr. Manuella Ghazi at the beginning of June for dementia and possible TIA. She had EEG on 10/10/16 but she hasn't heard results yet. She has another follow up with Neurology in September.   Thyroid Nodule She had consultation with Dr. Gabriel Carina 6/16 had  thyroid ultrasound on 6/27 with findings c/w multinodular goiter.     Allergies  Allergen Reactions  . Naproxen Swelling    Had lip swelling 20 yrs ago - not sure if caused by oxycodone or naproxen  . Oxycodone-Aspirin Swelling    Had lip swelling 20 yrs ago. Not sure if caused by oxycodone or naproxen.  . Prednisone Diarrhea     Current Outpatient Prescriptions:  .  amLODipine (NORVASC) 10 MG tablet, Take 1  tablet (10 mg total) by mouth daily., Disp: 30 tablet, Rfl: 11 .  atorvastatin (LIPITOR) 40 MG tablet, TAKE 1 TABLET (40 MG TOTAL) BY MOUTH DAILY AT 6 PM., Disp: 30 tablet, Rfl: 5 .  citalopram (CELEXA) 20 MG tablet, TAKE ONE TABLET BY MOUTH ONCE DAILY, Disp: 90 tablet, Rfl: 1 .  clopidogrel (PLAVIX) 75 MG tablet, Take 1 tablet (75 mg total) by mouth daily., Disp: 30 tablet, Rfl: 11 .  feeding supplement, ENSURE ENLIVE, (ENSURE ENLIVE) LIQD, Take 237 mLs by mouth 2 (two) times daily between meals., Disp: 237 mL, Rfl: 12 .  fluticasone (FLONASE) 50 MCG/ACT nasal spray, INSTILL 2 SPRAYS INTO EACH NOSTRIL DAILY, Disp: 48 g, Rfl: 1 .  meloxicam (MOBIC) 7.5 MG tablet, Take 1 tablet (7.5 mg total) by mouth daily., Disp: 90 tablet, Rfl: 1 .  Multiple Vitamins-Minerals (MULTIVITAMIN) tablet, Take 1 tablet by mouth daily., Disp: 30 tablet, Rfl: 0 .  Omega-3 Fatty Acids (FISH OIL) 1200 MG CAPS, Take 1 capsule by mouth every other day. , Disp: 60 capsule, Rfl: 0 .  spironolactone (ALDACTONE) 25 MG tablet, Take 1 tablet (25 mg total) by mouth daily., Disp: 30 tablet, Rfl: 5 .  traMADol (ULTRAM) 50 MG tablet, Take 1 tablet (50 mg total) by  mouth every 6 (six) hours as needed., Disp: 30 tablet, Rfl: 0 .  vitamin E 100 UNIT capsule, Take 2 capsules (200 Units total) by mouth daily., Disp: 60 capsule, Rfl: 0  Review of Systems  Constitutional: Negative.   Respiratory: Negative.   Cardiovascular: Negative.   Musculoskeletal: Negative.   Neurological: Negative.   Psychiatric/Behavioral: Negative.     Social History  Substance Use Topics  . Smoking status: Never Smoker  . Smokeless tobacco: Never Used  . Alcohol use No   Objective:   BP 132/64 (BP Location: Right Arm, Patient Position: Sitting, Cuff Size: Normal)   Pulse 76   Temp 98.2 F (36.8 C)   Resp 16   Ht 5\' 8"  (1.727 m)   Wt 119 lb (54 kg)   SpO2 98%   BMI 18.09 kg/m  Vitals:   10/30/16 1111  BP: 132/64  Pulse: 76  Resp: 16  Temp:  98.2 F (36.8 C)  SpO2: 98%  Weight: 119 lb (54 kg)  Height: 5\' 8"  (1.727 m)     Physical Exam   General Appearance:    Alert, cooperative, no distress  Eyes:    PERRL, conjunctiva/corneas clear, EOM's intact       Lungs:     Clear to auscultation bilaterally, respirations unlabored  Heart:    Regular rate and rhythm  Neurologic:   Awake, alert, oriented x 3. No apparent focal neurological           defect.           Assessment & Plan:     1. Hyponatremia Tolerating change from Dyazide to spironolactone. Check electrolytes today.   2. Essential hypertension Well controlled.   - Lipid panel - Comprehensive metabolic panel  3. Multinodular goiter Asymptomatic.   4. Hypercholesteremia Tolerating recent change to atorvastatin well.  - Lipid panel  5. Fatigue   6. Abnormal weight loss Continue nutritional supplements.   The entirety of the information documented in the History of Present Illness, Review of Systems and Physical Exam were personally obtained by me. Portions of this information were initially documented by Wilburt Finlay, CMA and reviewed by me for thoroughness and accuracy.        Lelon Huh, MD  [facility]

## 2016-10-31 LAB — LIPID PANEL
Chol/HDL Ratio: 2.4 ratio (ref 0.0–4.4)
Cholesterol, Total: 140 mg/dL (ref 100–199)
HDL: 59 mg/dL (ref >39)
LDL CALC: 58 mg/dL (ref 0–99)
Triglycerides: 117 mg/dL (ref 0–149)
VLDL Cholesterol Cal: 23 mg/dL (ref 5–40)

## 2016-10-31 LAB — COMPREHENSIVE METABOLIC PANEL
ALT: 21 IU/L (ref 0–32)
AST: 9 IU/L (ref 0–40)
Albumin/Globulin Ratio: 1.5 (ref 1.2–2.2)
Albumin: 4.3 g/dL (ref 3.5–4.7)
Alkaline Phosphatase: 97 IU/L (ref 39–117)
BUN/Creatinine Ratio: 18 (ref 12–28)
BUN: 22 mg/dL (ref 8–27)
Bilirubin Total: 0.4 mg/dL (ref 0.0–1.2)
CALCIUM: 9.7 mg/dL (ref 8.7–10.3)
CO2: 23 mmol/L (ref 20–29)
CREATININE: 1.24 mg/dL — AB (ref 0.57–1.00)
Chloride: 96 mmol/L (ref 96–106)
GFR, EST AFRICAN AMERICAN: 45 mL/min/{1.73_m2} — AB (ref >59)
GFR, EST NON AFRICAN AMERICAN: 39 mL/min/{1.73_m2} — AB (ref >59)
GLUCOSE: 96 mg/dL (ref 65–99)
Globulin, Total: 2.9 g/dL (ref 1.5–4.5)
POTASSIUM: 4.8 mmol/L (ref 3.5–5.2)
Sodium: 136 mmol/L (ref 134–144)
TOTAL PROTEIN: 7.2 g/dL (ref 6.0–8.5)

## 2016-11-03 NOTE — Telephone Encounter (Signed)
Called and spoke with receptionist at [facility]. She states the results have not been entered into patients chart yet. Receptionist will have a nurse call us back once the results have been entered and they will fax [place] a copy of the test results.

## 2016-11-05 DIAGNOSIS — M5416 Radiculopathy, lumbar region: Secondary | ICD-10-CM | POA: Diagnosis not present

## 2016-11-05 DIAGNOSIS — M5136 Other intervertebral disc degeneration, lumbar region: Secondary | ICD-10-CM | POA: Diagnosis not present

## 2016-11-05 DIAGNOSIS — M48062 Spinal stenosis, lumbar region with neurogenic claudication: Secondary | ICD-10-CM | POA: Diagnosis not present

## 2016-11-26 ENCOUNTER — Other Ambulatory Visit: Payer: Self-pay | Admitting: Physician Assistant

## 2016-11-26 DIAGNOSIS — I10 Essential (primary) hypertension: Secondary | ICD-10-CM

## 2016-12-03 ENCOUNTER — Telehealth: Payer: Self-pay

## 2016-12-03 NOTE — Telephone Encounter (Signed)
[facility] pharmacy refill request for the following medication.  Medication: Metoprolol Tar Tab 25 MG  Qty:90  R:3 Date written:12/11/2015 Last filled:08/28/2016  Take 1/2 tablet (12.5 MG total) by mouth 2 (two) times daily.  Thanks,  -Joseline

## 2016-12-03 NOTE — Telephone Encounter (Signed)
Dr. Caryn Section patient now, but metoprolol was stopped on 08/14/16

## 2016-12-03 NOTE — Telephone Encounter (Signed)
FYI

## 2016-12-04 NOTE — Telephone Encounter (Signed)
Called pt who stated that she is not taking metoprolol anymore.

## 2016-12-08 DIAGNOSIS — H348122 Central retinal vein occlusion, left eye, stable: Secondary | ICD-10-CM | POA: Diagnosis not present

## 2016-12-08 DIAGNOSIS — H40003 Preglaucoma, unspecified, bilateral: Secondary | ICD-10-CM | POA: Diagnosis not present

## 2016-12-15 DIAGNOSIS — H40003 Preglaucoma, unspecified, bilateral: Secondary | ICD-10-CM | POA: Diagnosis not present

## 2016-12-16 ENCOUNTER — Other Ambulatory Visit: Payer: Self-pay | Admitting: Physician Assistant

## 2016-12-16 DIAGNOSIS — F4329 Adjustment disorder with other symptoms: Secondary | ICD-10-CM

## 2016-12-26 ENCOUNTER — Other Ambulatory Visit: Payer: Self-pay | Admitting: Physician Assistant

## 2017-01-12 DIAGNOSIS — G309 Alzheimer's disease, unspecified: Secondary | ICD-10-CM | POA: Diagnosis not present

## 2017-01-12 DIAGNOSIS — F0280 Dementia in other diseases classified elsewhere without behavioral disturbance: Secondary | ICD-10-CM | POA: Diagnosis not present

## 2017-01-12 DIAGNOSIS — F0150 Vascular dementia without behavioral disturbance: Secondary | ICD-10-CM | POA: Diagnosis not present

## 2017-01-12 DIAGNOSIS — E041 Nontoxic single thyroid nodule: Secondary | ICD-10-CM | POA: Diagnosis not present

## 2017-01-12 DIAGNOSIS — F99 Mental disorder, not otherwise specified: Secondary | ICD-10-CM | POA: Diagnosis not present

## 2017-01-18 ENCOUNTER — Ambulatory Visit (INDEPENDENT_AMBULATORY_CARE_PROVIDER_SITE_OTHER): Payer: PPO | Admitting: Family Medicine

## 2017-01-18 VITALS — BP 122/58 | HR 74 | Temp 97.9°F | Wt 122.0 lb

## 2017-01-18 DIAGNOSIS — E041 Nontoxic single thyroid nodule: Secondary | ICD-10-CM

## 2017-01-18 DIAGNOSIS — I10 Essential (primary) hypertension: Secondary | ICD-10-CM

## 2017-01-18 DIAGNOSIS — Z23 Encounter for immunization: Secondary | ICD-10-CM | POA: Diagnosis not present

## 2017-01-18 DIAGNOSIS — I679 Cerebrovascular disease, unspecified: Secondary | ICD-10-CM

## 2017-01-18 NOTE — Progress Notes (Signed)
Patient: Becky Prince Female    DOB: Aug 10, 1925   83 y.o.   MRN: [id number] Visit Date: 01/18/2017  Today's Provider: Lelon Huh, MD   Chief Complaint  Patient presents with  . Hypertension   Subjective:    HPI   pt is here today to discuss her mediations. She is wondering if she is suppose to be taking Triam/HCTZ and Plavix. She was in the hospital for possible mini stroke and is now confused about her medications. She was started on Aricept and stopped citalopram. She only took Aricept one day because it kept her up and made her have nightmares., but has not started back on citalopram, wants to know if she should.      Allergies  Allergen Reactions  . Naproxen Swelling    Had lip swelling 20 yrs ago - not sure if caused by oxycodone or naproxen  . Oxycodone-Aspirin Swelling    Had lip swelling 20 yrs ago. Not sure if caused by oxycodone or naproxen.  . Prednisone Diarrhea     Current Outpatient Prescriptions:  .  amLODipine (NORVASC) 10 MG tablet, Take 1 tablet (10 mg total) by mouth daily., Disp: 30 tablet, Rfl: 11 .  aspirin EC 81 MG tablet, Take by mouth., Disp: , Rfl:  .  atorvastatin (LIPITOR) 40 MG tablet, TAKE 1 TABLET (40 MG TOTAL) BY MOUTH DAILY AT 6 PM., Disp: 30 tablet, Rfl: 5 .  fluticasone (FLONASE) 50 MCG/ACT nasal spray, INSTILL 2 SPRAYS INTO EACH NOSTRIL DAILY, Disp: 48 g, Rfl: 1 .  meloxicam (MOBIC) 7.5 MG tablet, TAKE 1 TABLET (7.5 MG TOTAL) BY MOUTH DAILY., Disp: 30 tablet, Rfl: 5 .  Multiple Vitamins-Minerals (MULTIVITAMIN) tablet, Take 1 tablet by mouth daily., Disp: 30 tablet, Rfl: 0 .  Omega-3 Fatty Acids (FISH OIL) 1200 MG CAPS, Take 1 capsule by mouth every other day. , Disp: 60 capsule, Rfl: 0 .  spironolactone (ALDACTONE) 25 MG tablet, Take 1 tablet (25 mg total) by mouth daily., Disp: 30 tablet, Rfl: 5 .  traMADol (ULTRAM) 50 MG tablet, Take 1 tablet (50 mg total) by mouth every 6 (six) hours as needed., Disp: 30 tablet, Rfl: 0 .   citalopram (CELEXA) 20 MG tablet, TAKE ONE TABLET BY MOUTH ONCE DAILY (Patient not taking: Reported on 01/18/2017), Disp: 90 tablet, Rfl: 1 .  feeding supplement, ENSURE ENLIVE, (ENSURE ENLIVE) LIQD, Take 237 mLs by mouth 2 (two) times daily between meals. (Patient not taking: Reported on 01/18/2017), Disp: 237 mL, Rfl: 12  Review of Systems  Constitutional: Negative.   HENT: Negative.   Eyes: Negative.   Respiratory: Negative.   Cardiovascular: Negative.   Gastrointestinal: Negative.   Endocrine: Negative.   Genitourinary: Negative.   Musculoskeletal: Negative.   Skin: Negative.   Allergic/Immunologic: Negative.   Neurological: Negative.   Hematological: Negative.   Psychiatric/Behavioral: Negative.     Social History  Substance Use Topics  . Smoking status: Never Smoker  . Smokeless tobacco: Never Used  . Alcohol use No   Objective:   BP (!) 122/58   Pulse 74   Temp 97.9 F (36.6 C)   Wt 122 lb (55.3 kg)   BMI 18.55 kg/m     Physical Exam   General Appearance:    Alert, cooperative, no distress  Eyes:    PERRL, conjunctiva/corneas clear, EOM's intact       Lungs:     Clear to auscultation bilaterally, respirations unlabored  Heart:  Regular rate and rhythm  Neurologic:   Awake, alert, oriented x 3. No apparent focal neurological           defect.           Assessment & Plan:     1. Essential hypertension Well controlled.  Continue current medications.    2. Thyroid nodule Has been evaluated by Dr. Gabriel Carina   3. Cerebrovascular disease Doing well current medications. Continue regular follow up neurology. She refused to try Aricept again. She is going to start back on citalopram and take until follow up with neurology.   Flu vaccine given.   Return in about 4 months (around 05/20/2017) for htn.         Lelon Huh, MD  [facility]

## 2017-01-20 NOTE — Addendum Note (Signed)
Addended by: Lelon Huh E on: 01/20/2017 03:10 PM   Modules accepted: Level of Service

## 2017-01-25 ENCOUNTER — Telehealth: Payer: Self-pay

## 2017-01-25 NOTE — Telephone Encounter (Signed)
Actually, I didn't realize when she was here that ultrasound of thyroid had already been done at [facility] in June and everything was fine.

## 2017-01-25 NOTE — Telephone Encounter (Signed)
Patient called saying that she was supposed to have a thyroid test done? Please review. Thanks!

## 2017-01-26 NOTE — Telephone Encounter (Signed)
Tried calling patient. No answer. Unable to leave message.Will try calling again later.

## 2017-01-27 NOTE — Telephone Encounter (Signed)
Patient advised.

## 2017-01-27 NOTE — Telephone Encounter (Signed)
Patient advised as below. Patient states she thought the hospital wanted her to have a needle biopsy of the thyroid. She says if you don't think she needs it then she is fine without doing it. I told patient that I would send a message back to you to check and make sure this wasn't something that needed to be done.

## 2017-01-27 NOTE — Telephone Encounter (Signed)
Thyroid ultrasound was ordered by endocrinologist, Dr. Gabriel Carina. I don't see anything in Dr. Valda Favia notes or the ultrasound report indicating need for biopsy. I'll double check with Dr. Gabriel Carina. Well call patient if there is any need for more testing.

## 2017-02-01 DIAGNOSIS — M48062 Spinal stenosis, lumbar region with neurogenic claudication: Secondary | ICD-10-CM | POA: Diagnosis not present

## 2017-02-01 DIAGNOSIS — M62830 Muscle spasm of back: Secondary | ICD-10-CM | POA: Diagnosis not present

## 2017-02-01 DIAGNOSIS — M5136 Other intervertebral disc degeneration, lumbar region: Secondary | ICD-10-CM | POA: Diagnosis not present

## 2017-02-01 DIAGNOSIS — M5416 Radiculopathy, lumbar region: Secondary | ICD-10-CM | POA: Diagnosis not present

## 2017-02-15 ENCOUNTER — Ambulatory Visit: Payer: Self-pay | Admitting: Family Medicine

## 2017-02-15 ENCOUNTER — Ambulatory Visit: Payer: PPO | Admitting: Physician Assistant

## 2017-03-04 ENCOUNTER — Other Ambulatory Visit: Payer: Self-pay | Admitting: Physician Assistant

## 2017-03-04 DIAGNOSIS — J301 Allergic rhinitis due to pollen: Secondary | ICD-10-CM

## 2017-03-09 ENCOUNTER — Encounter: Payer: Self-pay | Admitting: Family Medicine

## 2017-03-09 ENCOUNTER — Ambulatory Visit: Payer: PPO | Admitting: Family Medicine

## 2017-03-09 VITALS — BP 106/60 | HR 80 | Temp 97.8°F | Resp 16 | Wt 116.0 lb

## 2017-03-09 DIAGNOSIS — R358 Other polyuria: Secondary | ICD-10-CM

## 2017-03-09 DIAGNOSIS — R3589 Other polyuria: Secondary | ICD-10-CM

## 2017-03-09 DIAGNOSIS — R252 Cramp and spasm: Secondary | ICD-10-CM | POA: Diagnosis not present

## 2017-03-09 DIAGNOSIS — R634 Abnormal weight loss: Secondary | ICD-10-CM | POA: Diagnosis not present

## 2017-03-09 NOTE — Progress Notes (Signed)
Patient: Becky Prince Female    DOB: [birth year]   83 y.o.   MRN: [id number] Visit Date: 03/09/2017  Today's Provider: Lelon Huh, MD   Chief Complaint  Patient presents with  . Leg Pain   Subjective:    HPI Lower leg cramps and redness: Patient comes in today reporting that she has had cramping in her lower legs for the past 2 nights. She has also had redness in her lower legs for several years that seems to have worsened over the past few days. Patient feels nauseous, constipated and has no appetite. Patient reports she has frequent urination only at night for the past year.  She states she has tried taking the Donepezil again, but that it makes her have to get up several times a night to urinate. Is no longer taking Celexa.   Wt Readings from Last 5 Encounters:  03/09/17 116 lb (52.6 kg)  01/18/17 122 lb (55.3 kg)  10/30/16 119 lb (54 kg)  09/28/16 122 lb 11.2 oz (55.7 kg)  08/14/16 127 lb (57.6 kg)       Allergies  Allergen Reactions  . Aricept [Donepezil Hcl]     Dizziness and insomnia  . Naproxen Swelling    Had lip swelling 20 yrs ago - not sure if caused by oxycodone or naproxen  . Oxycodone-Aspirin Swelling    Had lip swelling 20 yrs ago. Not sure if caused by oxycodone or naproxen.  . Prednisone Diarrhea     Current Outpatient Medications:  .  amLODipine (NORVASC) 10 MG tablet, Take 1 tablet (10 mg total) by mouth daily., Disp: 30 tablet, Rfl: 11 .  aspirin EC 81 MG tablet, Take by mouth., Disp: , Rfl:  .  atorvastatin (LIPITOR) 40 MG tablet, TAKE 1 TABLET (40 MG TOTAL) BY MOUTH DAILY AT 6 PM., Disp: 30 tablet, Rfl: 5 .  donepezil (ARICEPT) 5 MG tablet, Take 1 tablet at bedtime by mouth., Disp: , Rfl: 3 .  feeding supplement, ENSURE ENLIVE, (ENSURE ENLIVE) LIQD, Take 237 mLs by mouth 2 (two) times daily between meals., Disp: 237 mL, Rfl: 12 .  fluticasone (FLONASE) 50 MCG/ACT nasal spray, SPRAY 2 SPRAYS INTO EACH NOSTRIL EVERY DAY, Disp: 48 g, Rfl:  1 .  meloxicam (MOBIC) 7.5 MG tablet, TAKE 1 TABLET (7.5 MG TOTAL) BY MOUTH DAILY., Disp: 30 tablet, Rfl: 5 .  Multiple Vitamins-Minerals (MULTIVITAMIN) tablet, Take 1 tablet by mouth daily., Disp: 30 tablet, Rfl: 0 .  Omega-3 Fatty Acids (FISH OIL) 1200 MG CAPS, Take 1 capsule by mouth every other day. , Disp: 60 capsule, Rfl: 0 .  spironolactone (ALDACTONE) 25 MG tablet, Take 1 tablet (25 mg total) by mouth daily., Disp: 30 tablet, Rfl: 5 .  traMADol (ULTRAM) 50 MG tablet, Take 1 tablet (50 mg total) by mouth every 6 (six) hours as needed., Disp: 30 tablet, Rfl: 0 .  citalopram (CELEXA) 20 MG tablet, TAKE ONE TABLET BY MOUTH ONCE DAILY (Patient not taking: Reported on 01/18/2017), Disp: 90 tablet, Rfl: 1  Review of Systems  Constitutional: Positive for appetite change. Negative for chills, fatigue, fever and unexpected weight change.  Respiratory: Negative for chest tightness and shortness of breath.   Cardiovascular: Negative for chest pain and palpitations.  Gastrointestinal: Negative for abdominal pain, nausea and vomiting.  Endocrine: Positive for polyuria (at night).  Skin: Positive for color change.  Neurological: Negative for dizziness and weakness.  Psychiatric/Behavioral: Positive for sleep disturbance.    Social  History   Tobacco Use  . Smoking status: Never Smoker  . Smokeless tobacco: Never Used  Substance Use Topics  . Alcohol use: No    Alcohol/week: 0.0 oz   Objective:   BP 106/60 (BP Location: Right Arm, Patient Position: Sitting, Cuff Size: Normal)   Pulse 80   Temp 97.8 F (36.6 C) (Oral)   Resp 16   Wt 116 lb (52.6 kg)   SpO2 95% Comment: room air  BMI 17.64 kg/m  There were no vitals filed for this visit.   Physical Exam   General Appearance:    Alert, cooperative, no distress  Eyes:    PERRL, conjunctiva/corneas clear, EOM's intact       Lungs:     Clear to auscultation bilaterally, respirations unlabored  Heart:    Regular rate and rhythm    Neurologic:   Awake, alert, oriented x 3. No apparent focal neurological           defect.           Assessment & Plan:     1. Polyuria  - COMPLETE METABOLIC PANEL WITH GFR - CBC - TSH - T4, free - Magnesium  2. Leg cramps  - COMPLETE METABOLIC PANEL WITH GFR - CBC - TSH - T4, free - Magnesium  3. Abnormal weight loss  - COMPLETE METABOLIC PANEL WITH GFR - CBC - TSH - T4, free - Magnesium       Lelon Huh, MD  [facility]

## 2017-03-09 NOTE — Patient Instructions (Addendum)
   Try drinking a small glass of tonic water every night to help with leg cramps   Donepezil can cause nausea and weight loss. I suggest you change back to citalopram for a month to see if you feel better

## 2017-03-10 ENCOUNTER — Ambulatory Visit (INDEPENDENT_AMBULATORY_CARE_PROVIDER_SITE_OTHER): Payer: PPO

## 2017-03-10 VITALS — BP 132/68 | HR 88 | Temp 97.9°F | Ht 68.0 in | Wt 117.8 lb

## 2017-03-10 DIAGNOSIS — Z0000 Encounter for general adult medical examination without abnormal findings: Secondary | ICD-10-CM

## 2017-03-10 NOTE — Patient Instructions (Signed)
Becky Prince , Thank you for taking time to come for your Medicare Wellness Visit. I appreciate your ongoing commitment to your health goals. Please review the following plan we discussed and let me know if I can assist you in the future.   Screening recommendations/referrals: Colonoscopy: up to date Mammogram: up to date Bone Density: up to date Recommended yearly ophthalmology/optometry visit for glaucoma screening and checkup Recommended yearly dental visit for hygiene and checkup  Vaccinations: Influenza vaccine: completed Pneumococcal vaccine: completed series Tdap vaccine: up to date Shingles vaccine: completed 09/2006  Advanced directives: Please bring a copy of your POA (Power of [place]) and/or Living Will to your next appointment.   Conditions/risks identified: Fall risk prevention; Recommend increasing water intake to 4-6 glasses a day.   Next appointment: 05/24/16   Preventive Care 65 Years and Older, Female Preventive care refers to lifestyle choices and visits with your health care provider that can promote health and wellness. What does preventive care include?  A yearly physical exam. This is also called an annual well check.  Dental exams once or twice a year.  Routine eye exams. Ask your health care provider how often you should have your eyes checked.  Personal lifestyle choices, including:  Daily care of your teeth and gums.  Regular physical activity.  Eating a healthy diet.  Avoiding tobacco and drug use.  Limiting alcohol use.  Practicing safe sex.  Taking low-dose aspirin every day.  Taking vitamin and mineral supplements as recommended by your health care provider. What happens during an annual well check? The services and screenings done by your health care provider during your annual well check will depend on your age, overall health, lifestyle risk factors, and family history of disease. Counseling  Your health care provider may ask you  questions about your:  Alcohol use.  Tobacco use.  Drug use.  Emotional well-being.  Home and relationship well-being.  Sexual activity.  Eating habits.  History of falls.  Memory and ability to understand (cognition).  Work and work Statistician.  Reproductive health. Screening  You may have the following tests or measurements:  Height, weight, and BMI.  Blood pressure.  Lipid and cholesterol levels. These may be checked every 5 years, or more frequently if you are over 74 years old.  Skin check.  Lung cancer screening. You may have this screening every year starting at age 66 if you have a 30-pack-year history of smoking and currently smoke or have quit within the past 15 years.  Fecal occult blood test (FOBT) of the stool. You may have this test every year starting at age 29.  Flexible sigmoidoscopy or colonoscopy. You may have a sigmoidoscopy every 5 years or a colonoscopy every 10 years starting at age 57.  Hepatitis C blood test.  Hepatitis B blood test.  Sexually transmitted disease (STD) testing.  Diabetes screening. This is done by checking your blood sugar (glucose) after you have not eaten for a while (fasting). You may have this done every 1-3 years.  Bone density scan. This is done to screen for osteoporosis. You may have this done starting at age 48.  Mammogram. This may be done every 1-2 years. Talk to your health care provider about how often you should have regular mammograms. Talk with your health care provider about your test results, treatment options, and if necessary, the need for more tests. Vaccines  Your health care provider may recommend certain vaccines, such as:  Influenza vaccine. This is recommended  every year.  Tetanus, diphtheria, and acellular pertussis (Tdap, Td) vaccine. You may need a Td booster every 10 years.  Zoster vaccine. You may need this after age 50.  Pneumococcal 13-valent conjugate (PCV13) vaccine. One dose is  recommended after age 14.  Pneumococcal polysaccharide (PPSV23) vaccine. One dose is recommended after age 61. Talk to your health care provider about which screenings and vaccines you need and how often you need them. This information is not intended to replace advice given to you by your health care provider. Make sure you discuss any questions you have with your health care provider. Document Released: 05/10/2015 Document Revised: 01/01/2016 Document Reviewed: 02/12/2015 Elsevier Interactive Patient Education  2017 [place] Prevention in the Home Falls can cause injuries. They can happen to people of all ages. There are many things you can do to make your home safe and to help prevent falls. What can I do on the outside of my home?  Regularly fix the edges of walkways and driveways and fix any cracks.  Remove anything that might make you trip as you walk through a door, such as a raised step or threshold.  Trim any bushes or trees on the path to your home.  Use bright outdoor lighting.  Clear any walking paths of anything that might make someone trip, such as rocks or tools.  Regularly check to see if handrails are loose or broken. Make sure that both sides of any steps have handrails.  Any raised decks and porches should have guardrails on the edges.  Have any leaves, snow, or ice cleared regularly.  Use sand or salt on walking paths during winter.  Clean up any spills in your garage right away. This includes oil or grease spills. What can I do in the bathroom?  Use night lights.  Install grab bars by the toilet and in the tub and shower. Do not use towel bars as grab bars.  Use non-skid mats or decals in the tub or shower.  If you need to sit down in the shower, use a plastic, non-slip stool.  Keep the floor dry. Clean up any water that spills on the floor as soon as it happens.  Remove soap buildup in the tub or shower regularly.  Attach bath mats  securely with double-sided non-slip rug tape.  Do not have throw rugs and other things on the floor that can make you trip. What can I do in the bedroom?  Use night lights.  Make sure that you have a light by your bed that is easy to reach.  Do not use any sheets or blankets that are too big for your bed. They should not hang down onto the floor.  Have a firm chair that has side arms. You can use this for support while you get dressed.  Do not have throw rugs and other things on the floor that can make you trip. What can I do in the kitchen?  Clean up any spills right away.  Avoid walking on wet floors.  Keep items that you use a lot in easy-to-reach places.  If you need to reach something above you, use a strong step stool that has a grab bar.  Keep electrical cords out of the way.  Do not use floor polish or wax that makes floors slippery. If you must use wax, use non-skid floor wax.  Do not have throw rugs and other things on the floor that can make you trip. What  can I do with my stairs?  Do not leave any items on the stairs.  Make sure that there are handrails on both sides of the stairs and use them. Fix handrails that are broken or loose. Make sure that handrails are as long as the stairways.  Check any carpeting to make sure that it is firmly attached to the stairs. Fix any carpet that is loose or worn.  Avoid having throw rugs at the top or bottom of the stairs. If you do have throw rugs, attach them to the floor with carpet tape.  Make sure that you have a light switch at the top of the stairs and the bottom of the stairs. If you do not have them, ask someone to add them for you. What else can I do to help prevent falls?  Wear shoes that:  Do not have high heels.  Have rubber bottoms.  Are comfortable and fit you well.  Are closed at the toe. Do not wear sandals.  If you use a stepladder:  Make sure that it is fully opened. Do not climb a closed  stepladder.  Make sure that both sides of the stepladder are locked into place.  Ask someone to hold it for you, if possible.  Clearly [place] and make sure that you can see:  Any grab bars or handrails.  First and last steps.  Where the edge of each step is.  Use tools that help you move around (mobility aids) if they are needed. These include:  Canes.  Walkers.  Scooters.  Crutches.  Turn on the lights when you go into a dark area. Replace any light bulbs as soon as they burn out.  Set up your furniture so you have a clear path. Avoid moving your furniture around.  If any of your floors are uneven, fix them.  If there are any pets around you, be aware of where they are.  Review your medicines with your doctor. Some medicines can make you feel dizzy. This can increase your chance of falling. Ask your doctor what other things that you can do to help prevent falls. This information is not intended to replace advice given to you by your health care provider. Make sure you discuss any questions you have with your health care provider. Document Released: 02/07/2009 Document Revised: 09/19/2015 Document Reviewed: 05/18/2014 Elsevier Interactive Patient Education  2017 [employer].

## 2017-03-10 NOTE — Progress Notes (Signed)
Subjective:   Becky Prince is a 83 y.o. female who presents for Medicare Annual (Subsequent) preventive examination.  Review of Systems:  N/A  Cardiac Risk Factors include: advanced age (>59men, >42 women);dyslipidemia;hypertension     Objective:     Vitals: BP 132/68 (BP Location: Left Arm)   Pulse 88   Temp 97.9 F (36.6 C) (Oral)   Ht 5\' 8"  (1.727 m)   Wt 117 lb 12.8 oz (53.4 kg)   BMI 17.91 kg/m   Body mass index is 17.91 kg/m.   Tobacco Social History   Tobacco Use  Smoking Status Never Smoker  Smokeless Tobacco Never Used     Counseling given: Not Answered   Past Medical History:  Diagnosis Date  . Depression   . Environmental allergies   . Hypercholesteremia   . Hypertension    Past Surgical History:  Procedure Laterality Date  . ABDOMINAL HYSTERECTOMY     Family History  Problem Relation Age of Onset  . Heart disease Mother        CHF  . Hypertension Mother   . Cancer Father        prostate  . Cancer Sister        breast  . Kidney disease Brother   . Breast cancer Neg Hx    Social History   Substance and Sexual Activity  Sexual Activity Not on file    Outpatient Encounter Medications as of 03/10/2017  Medication Sig  . amLODipine (NORVASC) 10 MG tablet Take 1 tablet (10 mg total) by mouth daily.  Marland Kitchen aspirin EC 81 MG tablet Take by mouth.  Marland Kitchen atorvastatin (LIPITOR) 40 MG tablet TAKE 1 TABLET (40 MG TOTAL) BY MOUTH DAILY AT 6 PM.  . feeding supplement, ENSURE ENLIVE, (ENSURE ENLIVE) LIQD Take 237 mLs by mouth 2 (two) times daily between meals.  . fluticasone (FLONASE) 50 MCG/ACT nasal spray SPRAY 2 SPRAYS INTO EACH NOSTRIL EVERY DAY  . meloxicam (MOBIC) 7.5 MG tablet TAKE 1 TABLET (7.5 MG TOTAL) BY MOUTH DAILY.  . Multiple Vitamins-Minerals (MULTIVITAMIN) tablet Take 1 tablet by mouth daily.  . Omega-3 Fatty Acids (FISH OIL) 1200 MG CAPS Take 1 capsule by mouth every other day.   . spironolactone (ALDACTONE) 25 MG tablet Take 1 tablet  (25 mg total) by mouth daily.  . traMADol (ULTRAM) 50 MG tablet Take 1 tablet (50 mg total) by mouth every 6 (six) hours as needed.  . citalopram (CELEXA) 20 MG tablet TAKE ONE TABLET BY MOUTH ONCE DAILY (Patient not taking: Reported on 01/18/2017)  . donepezil (ARICEPT) 5 MG tablet Take 5 mg at bedtime by mouth.    No facility-administered encounter medications on file as of 03/10/2017.     Activities of Daily Living In your present state of health, do you have any difficulty performing the following activities: 03/10/2017 09/28/2016  Hearing? N N  Vision? N N  Difficulty concentrating or making decisions? Y N  Comment on Aricept -  Walking or climbing stairs? N N  Dressing or bathing? N N  Doing errands, shopping? N N  Preparing Food and eating ? N -  Using the Toilet? N -  In the past six months, have you accidently leaked urine? N -  Do you have problems with loss of bowel control? N -  Managing your Medications? N -  Managing your Finances? N -  Housekeeping or managing your Housekeeping? Y -  Some recent data might be hidden    Patient  Care Team: Birdie Sons, MD as PCP - General (Family Medicine) [name], MD as Referring Physician (Physical Medicine and Rehabilitation)    Assessment:     Exercise Activities and Dietary recommendations Current Exercise Habits: The patient does not participate in regular exercise at present, Exercise limited by: Other - see comments("hasnt felt like exercising")  Goals    None     Fall Risk Fall Risk  03/10/2017 08/14/2016 09/11/2015 09/11/2015  Falls in the past year? Yes No Yes No  Number falls in past yr: 2 or more - 1 -  Injury with Fall? No - - -  Follow up Falls prevention discussed - - -   Depression Screen PHQ 2/9 Scores 03/10/2017 10/06/2016 09/11/2015  PHQ - 2 Score 1 2 0  PHQ- 9 Score - 12 -     Cognitive Function: Pt declined screening today.         Immunization History  Administered Date(s)  Administered  . Influenza, High Dose Seasonal PF 02/09/2015, 03/06/2016, 01/18/2017  . Pneumococcal Conjugate-13 08/29/2014  . Pneumococcal Polysaccharide-23 03/06/2016  . Td 03/26/2003, 01/10/2011  . Tdap 01/10/2011  . Zoster 10/14/2006   Screening Tests Health Maintenance  Topic Date Due  . TETANUS/TDAP  01/09/2021  . INFLUENZA VACCINE  Completed  . DEXA SCAN  Completed  . PNA vac Low Risk Adult  Completed      Plan:  I have personally reviewed and addressed the Medicare Annual Wellness questionnaire and have noted the following in the patient's chart:  A. Medical and social history B. Use of alcohol, tobacco or illicit drugs  C. Current medications and supplements D. Functional ability and status E.  Nutritional status F.  Physical activity G. Advance directives H. List of other physicians I.  Hospitalizations, surgeries, and ER visits in previous 12 months J.  [place] such as hearing and vision if needed, cognitive and depression L. Referrals and appointments - none  In addition, I have reviewed and discussed with patient certain preventive protocols, quality metrics, and best practice recommendations. A written personalized care plan for preventive services as well as general preventive health recommendations were provided to patient.  See attached scanned questionnaire for additional information.   Signed,  Fabio Neighbors, LPN Nurse Health Advisor   MD Recommendations: None.

## 2017-03-11 ENCOUNTER — Other Ambulatory Visit (INDEPENDENT_AMBULATORY_CARE_PROVIDER_SITE_OTHER): Payer: PPO

## 2017-03-11 DIAGNOSIS — R634 Abnormal weight loss: Secondary | ICD-10-CM | POA: Diagnosis not present

## 2017-03-11 DIAGNOSIS — R252 Cramp and spasm: Secondary | ICD-10-CM | POA: Diagnosis not present

## 2017-03-11 DIAGNOSIS — R3589 Other polyuria: Secondary | ICD-10-CM

## 2017-03-11 DIAGNOSIS — R358 Other polyuria: Secondary | ICD-10-CM | POA: Diagnosis not present

## 2017-03-11 LAB — CBC
HEMATOCRIT: 40.2 % (ref 35.0–45.0)
HEMOGLOBIN: 14.1 g/dL (ref 11.7–15.5)
MCH: 32.4 pg (ref 27.0–33.0)
MCHC: 35.1 g/dL (ref 32.0–36.0)
MCV: 92.4 fL (ref 80.0–100.0)
MPV: 10.5 fL (ref 7.5–12.5)
Platelets: 297 10*3/uL (ref 140–400)
RBC: 4.35 10*6/uL (ref 3.80–5.10)
RDW: 11.2 % (ref 11.0–15.0)
WBC: 7.6 10*3/uL (ref 3.8–10.8)

## 2017-03-11 LAB — COMPLETE METABOLIC PANEL WITH GFR
AG Ratio: 1.5 (calc) (ref 1.0–2.5)
ALBUMIN MSPROF: 4.3 g/dL (ref 3.6–5.1)
ALT: 16 U/L (ref 6–29)
AST: 20 U/L (ref 10–35)
Alkaline phosphatase (APISO): 98 U/L (ref 33–130)
BUN / CREAT RATIO: 19 (calc) (ref 6–22)
BUN: 27 mg/dL — ABNORMAL HIGH (ref 7–25)
CALCIUM: 9.6 mg/dL (ref 8.6–10.4)
CO2: 27 mmol/L (ref 20–32)
Chloride: 100 mmol/L (ref 98–110)
Creat: 1.42 mg/dL — ABNORMAL HIGH (ref 0.60–0.88)
GFR, EST AFRICAN AMERICAN: 38 mL/min/{1.73_m2} — AB (ref >=60)
GFR, EST NON AFRICAN AMERICAN: 33 mL/min/{1.73_m2} — AB (ref >=60)
GLOBULIN: 2.8 g/dL (ref 1.9–3.7)
Glucose, Bld: 115 mg/dL — ABNORMAL HIGH (ref 65–99)
Potassium: 4.4 mmol/L (ref 3.5–5.3)
SODIUM: 136 mmol/L (ref 135–146)
TOTAL PROTEIN: 7.1 g/dL (ref 6.1–8.1)
Total Bilirubin: 0.5 mg/dL (ref 0.2–1.2)

## 2017-03-11 LAB — POCT URINALYSIS DIPSTICK
BILIRUBIN UA: NEGATIVE
Blood, UA: NEGATIVE
GLUCOSE UA: NEGATIVE
Ketones, UA: NEGATIVE
NITRITE UA: POSITIVE
PH UA: 6.5 (ref 5.0–8.0)
Protein, UA: NEGATIVE
Spec Grav, UA: 1.015 (ref 1.010–1.025)
UROBILINOGEN UA: 0.2 U/dL

## 2017-03-11 LAB — T4, FREE: Free T4: 1.4 ng/dL (ref 0.8–1.8)

## 2017-03-11 LAB — MAGNESIUM: Magnesium: 1.7 mg/dL (ref 1.5–2.5)

## 2017-03-11 LAB — TSH: TSH: 0.42 mIU/L (ref 0.40–4.50)

## 2017-03-11 NOTE — Progress Notes (Signed)
Patient came by and dropped of urine sample. UA in the office was done and Urine culture sent off/-Crispin Vogel V Jeriah Skufca, RMA

## 2017-03-12 ENCOUNTER — Other Ambulatory Visit: Payer: Self-pay | Admitting: Family Medicine

## 2017-03-12 DIAGNOSIS — R358 Other polyuria: Secondary | ICD-10-CM | POA: Diagnosis not present

## 2017-03-15 ENCOUNTER — Telehealth: Payer: Self-pay

## 2017-03-15 LAB — URINE CULTURE
MICRO NUMBER:: 81297432
SPECIMEN QUALITY:: ADEQUATE

## 2017-03-15 MED ORDER — CIPROFLOXACIN HCL 500 MG PO TABS: 500.0000 mg | ORAL_TABLET | Freq: Two times a day (BID) | ORAL | 0 refills | Status: AC

## 2017-03-15 NOTE — Telephone Encounter (Signed)
-----   Message from Birdie Sons, MD sent at 03/15/2017 12:24 PM EST ----- Urine culture shows infection . Need to start ciprofloxacin 500mg  twice daily for 5 days.

## 2017-03-15 NOTE — Telephone Encounter (Signed)
Advised patient of results. Medication was sent into the pharmacy.  

## 2017-03-19 ENCOUNTER — Telehealth: Payer: Self-pay

## 2017-03-19 ENCOUNTER — Encounter: Payer: Self-pay | Admitting: Family Medicine

## 2017-03-19 ENCOUNTER — Ambulatory Visit: Payer: PPO | Admitting: Family Medicine

## 2017-03-19 VITALS — BP 102/60 | HR 87 | Temp 97.9°F | Wt 115.4 lb

## 2017-03-19 DIAGNOSIS — Z8673 Personal history of transient ischemic attack (TIA), and cerebral infarction without residual deficits: Secondary | ICD-10-CM

## 2017-03-19 DIAGNOSIS — F0150 Vascular dementia without behavioral disturbance: Secondary | ICD-10-CM

## 2017-03-19 DIAGNOSIS — R112 Nausea with vomiting, unspecified: Secondary | ICD-10-CM | POA: Diagnosis not present

## 2017-03-19 NOTE — Progress Notes (Signed)
Patient: Becky Prince Female    DOB: [birth year]   83 y.o.   MRN: [id number] Visit Date: 03/19/2017  Today's Provider: Vernie Murders, PA   Chief Complaint  Patient presents with  . Nausea  . Emesis   Subjective:   Patient presents today with nausea, vomiting, constipation, and loss of appetite. She reports symptoms started on 03/17/2017 when she started taking Cipro for UTI. She believes symptoms were a side effect from medication. She took 3 tablets and discontinued. She states the symptoms have improved slightly. She has not been able to eat the last 3 days until this morning.   Past Medical History:  Diagnosis Date  . Depression   . Environmental allergies   . Hypercholesteremia   . Hypertension    Patient Active Problem List   Diagnosis Date Noted  . Hyponatremia 10/06/2016  . Cerebrovascular disease 10/06/2016  . Multinodular goiter 10/06/2016  . TIA (transient ischemic attack) 09/28/2016  . Osteoporosis 10/14/2015  . Hip bursitis 10/14/2015  . Adaptation reaction 04/12/2015  . CN (constipation) 04/12/2015  . Contracture of finger joint 04/12/2015  . D (diarrhea) 04/12/2015  . Fatigue 04/12/2015  . Hypercholesteremia 04/12/2015  . BP (high blood pressure) 04/12/2015  . Allergic rhinitis, seasonal 04/12/2015  . Body tinea 04/12/2015  . Subclinical hyperthyroidism 11/16/2014   Past Surgical History:  Procedure Laterality Date  . ABDOMINAL HYSTERECTOMY    . CATARACT EXTRACTION W/PHACO Right 06/12/2015   Procedure: CATARACT EXTRACTION PHACO AND INTRAOCULAR LENS PLACEMENT (IOC);  Surgeon: Leandrew Koyanagi, MD;  Location: [facility];  Service: Ophthalmology;  Laterality: Right;   Family History  Problem Relation Age of Onset  . Heart disease Mother        CHF  . Hypertension Mother   . Cancer Father        prostate  . Cancer Sister        breast  . Kidney disease Brother   . Breast cancer Neg Hx    Allergies  Allergen Reactions  . Aricept  [Donepezil Hcl]     Dizziness and insomnia  . Naproxen Swelling    Had lip swelling 20 yrs ago - not sure if caused by oxycodone or naproxen  . Oxycodone-Aspirin Swelling    Had lip swelling 20 yrs ago. Not sure if caused by oxycodone or naproxen.  . Prednisone Diarrhea    Current Outpatient Medications:  .  amLODipine (NORVASC) 10 MG tablet, Take 1 tablet (10 mg total) by mouth daily., Disp: 30 tablet, Rfl: 11 .  aspirin EC 81 MG tablet, Take by mouth., Disp: , Rfl:  .  atorvastatin (LIPITOR) 40 MG tablet, TAKE 1 TABLET (40 MG TOTAL) BY MOUTH DAILY AT 6 PM., Disp: 30 tablet, Rfl: 5 .  clopidogrel (PLAVIX) 75 MG tablet, , Disp: , Rfl:  .  donepezil (ARICEPT) 5 MG tablet, Take 5 mg at bedtime by mouth. , Disp: , Rfl:  .  feeding supplement, ENSURE ENLIVE, (ENSURE ENLIVE) LIQD, Take 237 mLs by mouth 2 (two) times daily between meals., Disp: 237 mL, Rfl: 12 .  fluticasone (FLONASE) 50 MCG/ACT nasal spray, SPRAY 2 SPRAYS INTO EACH NOSTRIL EVERY DAY, Disp: 48 g, Rfl: 1 .  meloxicam (MOBIC) 7.5 MG tablet, TAKE 1 TABLET (7.5 MG TOTAL) BY MOUTH DAILY., Disp: 30 tablet, Rfl: 5 .  Multiple Vitamins-Minerals (MULTIVITAMIN) tablet, Take 1 tablet by mouth daily., Disp: 30 tablet, Rfl: 0 .  Omega-3 Fatty Acids (FISH OIL) 1200 MG CAPS, Take  1 capsule by mouth every other day. , Disp: 60 capsule, Rfl: 0 .  spironolactone (ALDACTONE) 25 MG tablet, Take 1 tablet (25 mg total) by mouth daily., Disp: 30 tablet, Rfl: 5 .  traMADol (ULTRAM) 50 MG tablet, Take 1 tablet (50 mg total) by mouth every 6 (six) hours as needed., Disp: 30 tablet, Rfl: 0 .  ciprofloxacin (CIPRO) 500 MG tablet, Take 1 tablet (500 mg total) 2 (two) times daily for 5 days by mouth. (Patient not taking: Reported on 03/19/2017), Disp: 10 tablet, Rfl: 0 .  citalopram (CELEXA) 20 MG tablet, TAKE ONE TABLET BY MOUTH ONCE DAILY (Patient not taking: Reported on 01/18/2017), Disp: 90 tablet, Rfl: 1  Review of Systems  Constitutional: Positive for  appetite change.  Respiratory: Negative.   Cardiovascular: Negative.   Gastrointestinal: Positive for constipation, nausea and vomiting.    Social History   Tobacco Use  . Smoking status: Never Smoker  . Smokeless tobacco: Never Used  Substance Use Topics  . Alcohol use: No    Alcohol/week: 0.0 oz   Objective:   BP 102/60 (BP Location: Right Arm, Patient Position: Sitting, Cuff Size: Normal)   Pulse 87   Temp 97.9 F (36.6 C) (Oral)   Wt 115 lb 6.4 oz (52.3 kg)   SpO2 98%   BMI 17.55 kg/m  Wt Readings from Last 3 Encounters:  03/19/17 115 lb 6.4 oz (52.3 kg)  03/10/17 117 lb 12.8 oz (53.4 kg)  03/09/17 116 lb (52.6 kg)   Physical Exam  Constitutional: She appears well-developed and well-nourished. No distress.  HENT:  Head: Normocephalic and atraumatic.  Right Ear: Hearing normal.  Left Ear: Hearing normal.  Nose: Nose normal.  Eyes: Conjunctivae and lids are normal. Right eye exhibits no discharge. Left eye exhibits no discharge. No scleral icterus.  Neck: Neck supple.  Cardiovascular: Normal rate.  Pulmonary/Chest: Effort normal. No respiratory distress.  Abdominal: Soft. Bowel sounds are normal.  Musculoskeletal: Normal range of motion.  Skin: Skin is intact. No lesion and no rash noted.  Psychiatric: Her speech is normal.      Assessment & Plan:     1. Non-intractable vomiting with nausea, unspecified vomiting type Feels she had nausea and vomited some clear liquid after being on the Cipro for 3-4 days. States she took half the Cipro 500 mg BID #10. Chart indicates she has had nausea and no appetite frequently since the hospitalization in June 2018 for TIA/stroke. Does not want to take any more of the Cipro. Feeling a little better since she ate some grits this morning and has not vomited. Unable to get urine specimen to assess progress in clearing the E.coli UTI. Encouraged to drink plenty of fluids and try to eat some food at each meal. Scheduled recheck on  03-26-17 to recheck urinalysis and possibly CBC with CMP. May use Emetrol to help with any returning nausea.  2. Vascular dementia without behavioral disturbance Followed by Dr. Manuella Ghazi (neurologist) and taking Donepezil 5 mg hs. Pleasant and cooperative today.  3. Hx of TIA (transient ischemic attack) and stroke Hospitalized 09-28-16 with final diagnosis of cerebral infarction, CVA and TIA. Followed by Dr. Manuella Ghazi.       Vernie Murders, PA  [facility]

## 2017-03-19 NOTE — Telephone Encounter (Signed)
Patient called saying that she has had nausea and vomiting for the last 2-3 days. She also reports that she has had no appetite and feels that this may be coming from the antibiotic that she was taking. She reports that the last time she vomited was yesterday morning. She does feel a little better, but she requested to be seen in the office due to being weak. Appt was scheduled today at 11:30 with Simona Huh.

## 2017-03-25 ENCOUNTER — Other Ambulatory Visit: Payer: Self-pay | Admitting: Family Medicine

## 2017-03-26 ENCOUNTER — Ambulatory Visit: Payer: PPO | Admitting: Family Medicine

## 2017-03-26 ENCOUNTER — Encounter: Payer: Self-pay | Admitting: Family Medicine

## 2017-03-26 VITALS — BP 104/62 | HR 69 | Temp 97.4°F | Resp 14 | Wt 117.0 lb

## 2017-03-26 DIAGNOSIS — R252 Cramp and spasm: Secondary | ICD-10-CM

## 2017-03-26 DIAGNOSIS — N390 Urinary tract infection, site not specified: Secondary | ICD-10-CM

## 2017-03-26 LAB — POCT URINALYSIS DIPSTICK
Bilirubin, UA: NEGATIVE
GLUCOSE UA: NEGATIVE
Ketones, UA: NEGATIVE
NITRITE UA: NEGATIVE
PH UA: 6 (ref 5.0–8.0)
PROTEIN UA: 30
RBC UA: NEGATIVE
Spec Grav, UA: 1.015 (ref 1.010–1.025)
UROBILINOGEN UA: 0.2 U/dL

## 2017-03-26 NOTE — Progress Notes (Signed)
Patient: Becky Prince Female    DOB: January 03, 1926   83 y.o.   MRN: [id number] Visit Date: 03/26/2017  Today's Provider: Vernie Murders, PA   Chief Complaint  Patient presents with  . Urinary Tract Infection  . Follow-up   Subjective:    HPI Patient is here today to follow up on UTI. Last OV on 03/19/17 patient reported side effects from Cipro prescribed on 03/17/17. Cipro was discontinued and patient reported symptoms had improved. On 03/19/17 she was unable to obtain a urine specimen to assess progress in clearing of E.Coli UTI. Patient advised to increase fluid intake and try to eat food at each meal. She reports good compliance with treatment plan. Patient denies symptoms of UTI. She reports nausea, vomiting, and loss of appetite has improved but she just doesn't "feel" like herself.   Past Medical History:  Diagnosis Date  . Depression   . Environmental allergies   . Hypercholesteremia   . Hypertension    Past Surgical History:  Procedure Laterality Date  . ABDOMINAL HYSTERECTOMY    . CATARACT EXTRACTION W/PHACO Right 06/12/2015   Procedure: CATARACT EXTRACTION PHACO AND INTRAOCULAR LENS PLACEMENT (IOC);  Surgeon: Leandrew Koyanagi, MD;  Location: [facility];  Service: Ophthalmology;  Laterality: Right;   Family History  Problem Relation Age of Onset  . Heart disease Mother        CHF  . Hypertension Mother   . Cancer Father        prostate  . Cancer Sister        breast  . Kidney disease Brother   . Breast cancer Neg Hx    Allergies  Allergen Reactions  . Aricept [Donepezil Hcl]     Dizziness and insomnia  . Naproxen Swelling    Had lip swelling 20 yrs ago - not sure if caused by oxycodone or naproxen  . Oxycodone-Aspirin Swelling    Had lip swelling 20 yrs ago. Not sure if caused by oxycodone or naproxen.  . Prednisone Diarrhea    Current Outpatient Medications:  .  amLODipine (NORVASC) 10 MG tablet, Take 1 tablet (10 mg total) by mouth  daily., Disp: 30 tablet, Rfl: 11 .  aspirin EC 81 MG tablet, Take by mouth., Disp: , Rfl:  .  atorvastatin (LIPITOR) 40 MG tablet, TAKE 1 TABLET (40 MG TOTAL) BY MOUTH DAILY AT 6 PM., Disp: 30 tablet, Rfl: 5 .  clopidogrel (PLAVIX) 75 MG tablet, , Disp: , Rfl:  .  donepezil (ARICEPT) 5 MG tablet, Take 5 mg at bedtime by mouth. , Disp: , Rfl:  .  feeding supplement, ENSURE ENLIVE, (ENSURE ENLIVE) LIQD, Take 237 mLs by mouth 2 (two) times daily between meals., Disp: 237 mL, Rfl: 12 .  fluticasone (FLONASE) 50 MCG/ACT nasal spray, SPRAY 2 SPRAYS INTO EACH NOSTRIL EVERY DAY, Disp: 48 g, Rfl: 1 .  meloxicam (MOBIC) 7.5 MG tablet, TAKE 1 TABLET (7.5 MG TOTAL) BY MOUTH DAILY., Disp: 30 tablet, Rfl: 5 .  Multiple Vitamins-Minerals (MULTIVITAMIN) tablet, Take 1 tablet by mouth daily., Disp: 30 tablet, Rfl: 0 .  Omega-3 Fatty Acids (FISH OIL) 1200 MG CAPS, Take 1 capsule by mouth every other day. , Disp: 60 capsule, Rfl: 0 .  spironolactone (ALDACTONE) 25 MG tablet, TAKE 1 TABLET BY MOUTH EVERY DAY, Disp: 30 tablet, Rfl: 12 .  traMADol (ULTRAM) 50 MG tablet, Take 1 tablet (50 mg total) by mouth every 6 (six) hours as needed., Disp: 30 tablet, Rfl: 0 .  citalopram (CELEXA) 20 MG tablet, TAKE ONE TABLET BY MOUTH ONCE DAILY (Patient not taking: Reported on 01/18/2017), Disp: 90 tablet, Rfl: 1  Review of Systems  Constitutional: Negative.   Respiratory: Negative.   Cardiovascular: Negative.     Social History   Tobacco Use  . Smoking status: Never Smoker  . Smokeless tobacco: Never Used  Substance Use Topics  . Alcohol use: No    Alcohol/week: 0.0 oz   Objective:   BP 104/62 (BP Location: Left Arm, Patient Position: Sitting, Cuff Size: Normal)   Pulse 69   Temp (!) 97.4 F (36.3 C) (Oral)   Resp 14   Wt 117 lb (53.1 kg)   BMI 17.79 kg/m   Wt Readings from Last 3 Encounters:  03/26/17 117 lb (53.1 kg)  03/19/17 115 lb 6.4 oz (52.3 kg)  03/10/17 117 lb 12.8 oz (53.4 kg)    Physical Exam    Constitutional: She appears well-developed and well-nourished. No distress.  HENT:  Head: Normocephalic and atraumatic.  Right Ear: Hearing normal.  Left Ear: Hearing normal.  Nose: Nose normal.  Eyes: Conjunctivae and lids are normal. Right eye exhibits no discharge. Left eye exhibits no discharge. No scleral icterus.  Neck: Neck supple.  Cardiovascular: Normal rate and regular rhythm.  Pulmonary/Chest: Effort normal and breath sounds normal. No respiratory distress.  Abdominal: Soft. Bowel sounds are normal.  Musculoskeletal: Normal range of motion.  Skin: Skin is intact. No lesion and no rash noted.  Psychiatric: Her speech is normal.       Assessment & Plan:     1. Urinary tract infection without hematuria, site unspecified No dysuria or hematuria. No fever or bladder spasms. Urinalysis normal. Increase fluid intake and recheck prn. - POCT Urinalysis Dipstick  2. Leg cramps Has had cramps in lower legs for several months - intermittently (not every night). CMP on 03-10-17 showed normal electrolytes. May use a mixture of equal parts Apple Juice with Tonic Water (quinine) 4-6 ounces at bedtime. Recheck prn.       Vernie Murders, PA  [facility]

## 2017-04-05 ENCOUNTER — Other Ambulatory Visit: Payer: Self-pay | Admitting: Physician Assistant

## 2017-04-07 DIAGNOSIS — M5136 Other intervertebral disc degeneration, lumbar region: Secondary | ICD-10-CM | POA: Diagnosis not present

## 2017-04-07 DIAGNOSIS — M48062 Spinal stenosis, lumbar region with neurogenic claudication: Secondary | ICD-10-CM | POA: Diagnosis not present

## 2017-04-07 DIAGNOSIS — M5416 Radiculopathy, lumbar region: Secondary | ICD-10-CM | POA: Diagnosis not present

## 2017-04-22 ENCOUNTER — Other Ambulatory Visit: Payer: Self-pay | Admitting: Physician Assistant

## 2017-05-05 DIAGNOSIS — M5136 Other intervertebral disc degeneration, lumbar region: Secondary | ICD-10-CM | POA: Diagnosis not present

## 2017-05-05 DIAGNOSIS — M62830 Muscle spasm of back: Secondary | ICD-10-CM | POA: Diagnosis not present

## 2017-05-05 DIAGNOSIS — M5416 Radiculopathy, lumbar region: Secondary | ICD-10-CM | POA: Diagnosis not present

## 2017-05-05 DIAGNOSIS — M48062 Spinal stenosis, lumbar region with neurogenic claudication: Secondary | ICD-10-CM | POA: Diagnosis not present

## 2017-05-24 ENCOUNTER — Encounter: Payer: Self-pay | Admitting: Family Medicine

## 2017-05-24 ENCOUNTER — Ambulatory Visit: Payer: Self-pay | Admitting: Family Medicine

## 2017-05-24 ENCOUNTER — Ambulatory Visit (INDEPENDENT_AMBULATORY_CARE_PROVIDER_SITE_OTHER): Payer: PPO | Admitting: Family Medicine

## 2017-05-24 VITALS — BP 140/66 | HR 82 | Temp 97.9°F | Resp 16 | Wt 110.0 lb

## 2017-05-24 DIAGNOSIS — M79604 Pain in right leg: Secondary | ICD-10-CM

## 2017-05-24 DIAGNOSIS — K5900 Constipation, unspecified: Secondary | ICD-10-CM

## 2017-05-24 DIAGNOSIS — F339 Major depressive disorder, recurrent, unspecified: Secondary | ICD-10-CM

## 2017-05-24 DIAGNOSIS — I10 Essential (primary) hypertension: Secondary | ICD-10-CM | POA: Diagnosis not present

## 2017-05-24 MED ORDER — MIRTAZAPINE 15 MG PO TABS
15.0000 mg | ORAL_TABLET | Freq: Every day | ORAL | 2 refills | Status: DC
Start: 2017-05-24 — End: 2017-08-10

## 2017-05-24 NOTE — Progress Notes (Signed)
Patient: Becky Prince Female    DOB: 10/23/1925   84 y.o.   MRN: [id number] Visit Date: 05/24/2017  Today's Provider: [name], MD   I, Martha Clan, CMA, am acting as scribe for [name], MD.  Chief Complaint  Patient presents with  . Hypertension  . Back Pain   Subjective:    HPI      Hypertension, follow-up:  BP Readings from Last 3 Encounters:  05/24/17 140/66  03/26/17 104/62  03/19/17 102/60    She was last seen for hypertension 4 months ago.  BP at that visit was 122/58. Management since that visit includes continuing medications. She reports excellent compliance with treatment. She is not having side effects.  She is exercising. Walking, though avoids walking outside because she has a H/O falls. She is adherent to low salt diet.   Outside blood pressures are normal per pt. SBP ranging from 100-130. States she checks this twice daily and it is never this high. She is experiencing fatigue.  Patient denies chest pain, chest pressure/discomfort, claudication, dyspnea, exertional chest pressure/discomfort, irregular heart beat, lower extremity edema, near-syncope, orthopnea, palpitations and syncope.   Cardiovascular risk factors include advanced age (older than 29 for men, 67 for women) and hypertension.    Weight trend: fluctuating a bit Wt Readings from Last 3 Encounters:  05/24/17 110 lb (49.9 kg)  03/26/17 117 lb (53.1 kg)  03/19/17 115 lb 6.4 oz (52.3 kg)    Current diet: decreased appetite, but states she eats healthy when she does eat.  ------------------------------------------------------------------------ Pt is also c/o back pain. This problem has been occurring for several years, and is worsening. She sees Dr. [name] for this once every 2 months or so. She also has right hip pain that radiates down right leg.  She is getting injections about every 3 months. She was told to avoid any surgery  The pain is causing nausea.  Her nausea is not always precipitated by the pain. She was prescribed Zofran for this, which takes a long time to become effective.   She also notes constipation. Chronic and intermittent problem.  Can go days without BM and has some straining.  Tried Milk of magnesium and couldn't find a happy medium on dosing.  1 cup didn't help and 2 cups gave diarrhea.  Has used Miralax in the past, but cant remember why she stopped it.  Does not want to take any more pills.  She is concerned because the pain is causing depression, fatigue and decreased interest.  She is not interested in talking to a therapist.  Depression screen [facility] 2/9 05/24/2017 03/10/2017 10/06/2016  Decreased Interest 1 1 0  Down, Depressed, Hopeless 2 0 2  PHQ - 2 Score 3 1 2   Altered sleeping 3 - 2  Tired, decreased energy 2 - 2  Change in appetite 3 - 0  Feeling bad or failure about yourself  2 - 2  Trouble concentrating 0 - 3  Moving slowly or fidgety/restless 0 - 1  Suicidal thoughts 0 - 0  PHQ-9 Score 13 - 12  Difficult doing work/chores Extremely dIfficult - Somewhat difficult     Allergies  Allergen Reactions  . Aricept [Donepezil Hcl]     Dizziness and insomnia  . Naproxen Swelling    Had lip swelling 20 yrs ago - not sure if caused by oxycodone or naproxen  . Oxycodone-Aspirin Swelling    Had lip swelling 20 yrs ago. Not sure  if caused by oxycodone or naproxen.  . Prednisone Diarrhea     Current Outpatient Medications:  .  amLODipine (NORVASC) 10 MG tablet, Take 1 tablet (10 mg total) by mouth daily., Disp: 30 tablet, Rfl: 11 .  aspirin EC 81 MG tablet, Take by mouth., Disp: , Rfl:  .  atorvastatin (LIPITOR) 40 MG tablet, TAKE 1 TABLET (40 MG TOTAL) BY MOUTH DAILY AT 6 PM., Disp: 30 tablet, Rfl: 5 .  citalopram (CELEXA) 20 MG tablet, TAKE ONE TABLET BY MOUTH ONCE DAILY, Disp: 90 tablet, Rfl: 1 .  feeding supplement, ENSURE ENLIVE, (ENSURE ENLIVE) LIQD, Take 237 mLs by mouth 2 (two) times daily between meals.,  Disp: 237 mL, Rfl: 12 .  fluticasone (FLONASE) 50 MCG/ACT nasal spray, SPRAY 2 SPRAYS INTO EACH NOSTRIL EVERY DAY, Disp: 48 g, Rfl: 1 .  meloxicam (MOBIC) 7.5 MG tablet, TAKE 1 TABLET (7.5 MG TOTAL) BY MOUTH DAILY., Disp: 30 tablet, Rfl: 5 .  Multiple Vitamins-Minerals (MULTIVITAMIN) tablet, Take 1 tablet by mouth daily., Disp: 30 tablet, Rfl: 0 .  Omega-3 Fatty Acids (FISH OIL) 1200 MG CAPS, Take 1 capsule by mouth every other day. , Disp: 60 capsule, Rfl: 0 .  spironolactone (ALDACTONE) 25 MG tablet, TAKE 1 TABLET BY MOUTH EVERY DAY, Disp: 30 tablet, Rfl: 12 .  traMADol (ULTRAM) 50 MG tablet, Take 1 tablet (50 mg total) by mouth every 6 (six) hours as needed., Disp: 30 tablet, Rfl: 0 .  clopidogrel (PLAVIX) 75 MG tablet, , Disp: , Rfl:  .  donepezil (ARICEPT) 5 MG tablet, *(MD CALL BACK0*TAKE 1 TABLET BY MOUTH EVERY DAY AT NIGHT, Disp: , Rfl: 3  Review of Systems  Constitutional: Positive for appetite change and fatigue.  Respiratory: Negative for shortness of breath.   Cardiovascular: Negative for chest pain, palpitations and leg swelling.  Gastrointestinal: Positive for constipation and nausea.  Genitourinary: Positive for frequency.  Musculoskeletal: Positive for arthralgias and back pain.  Neurological: Negative for syncope.  Psychiatric/Behavioral: Positive for dysphoric mood.    Social History   Tobacco Use  . Smoking status: Never Smoker  . Smokeless tobacco: Never Used  Substance Use Topics  . Alcohol use: No    Alcohol/week: 0.0 oz   Objective:   BP 140/66 (BP Location: Left Arm, Patient Position: Sitting, Cuff Size: Normal)   Pulse 82   Temp 97.9 F (36.6 C) (Oral)   Resp 16   Wt 110 lb (49.9 kg)   SpO2 93%   BMI 16.73 kg/m  Vitals:   05/24/17 1459  BP: 140/66  Pulse: 82  Resp: 16  Temp: 97.9 F (36.6 C)  TempSrc: Oral  SpO2: 93%  Weight: 110 lb (49.9 kg)     Physical Exam  Constitutional: She is oriented to person, place, and time. She appears  well-developed and well-nourished. No distress.  HENT:  Head: Normocephalic and atraumatic.  Eyes: Conjunctivae are normal. No scleral icterus.  Neck: Neck supple. No thyromegaly present.  Cardiovascular: Normal rate, regular rhythm, normal heart sounds and intact distal pulses.  No murmur heard. Pulmonary/Chest: Effort normal and breath sounds normal. No respiratory distress. She has no wheezes. She has no rales.  Musculoskeletal: She exhibits no edema.  Lymphadenopathy:    She has no cervical adenopathy.  Neurological: She is alert and oriented to person, place, and time.  Skin: Skin is warm and dry. No rash noted.  Psychiatric: She has a normal mood and affect. Her behavior is normal.  Vitals reviewed.  Assessment & Plan:     Problem List Items Addressed This Visit      Cardiovascular and Mediastinum   BP (high blood pressure) - Primary    Elevated today, but likely has some component of white coat syndrome as home BPs are so well controlled No changes today Continue current meds Recent BMP reviewed        Other   Depression, recurrent (HCC)    Chronic, worsening Discussed synergistic effects of meds with therapy Patient not interested in pursuing therapy at this time On max dose of citalopram for her age We will add Remeron at bedtime to help with sleep and depression      Relevant Medications   mirtazapine (REMERON) 15 MG tablet   CN (constipation)    Chronic problem Likely worsened by some of her medications such as tramadol Discuss possibility of medication targeted for opioid-induced constipation such as Movantik or Amitiza, patient states she does not want to take any more medications at this time Instead, we will try Mira lax Discussed starting with 1 cap full daily and titrating to one soft bowel movement daily      Right leg pain    Advised patient to continue following with orthopedics and take medicines as they have prescribed           Return in about 6 weeks (around 07/05/2017) for depression f/u.   The entirety of the information documented in the History of Present Illness, Review of Systems and Physical Exam were personally obtained by me. Portions of this information were initially documented by Raquel Sarna Ratchford, CMA and reviewed by me for thoroughness and accuracy.    Virginia Crews, MD, MPH [facility] 05/25/2017 9:15 AM

## 2017-05-24 NOTE — Patient Instructions (Addendum)
Start Miralax (Polyethylene glycol - generic is fine) 1 cap full in a glass of water daily Goal is 1 soft bowel movement daily with no straining  If still constipated, can increase to 1 cap twice daily If diarrhea or loose stools, can decrease to taking 1 cap every other day   Start Remeron (mirtazapine) every night at bedtime for depression Keep taking citalopram (Celexa) also   Follow up about leg/hip pain with Dr [name]

## 2017-05-25 NOTE — Assessment & Plan Note (Signed)
Chronic, worsening Discussed synergistic effects of meds with therapy Patient not interested in pursuing therapy at this time On max dose of citalopram for her age We will add Remeron at bedtime to help with sleep and depression

## 2017-05-25 NOTE — Assessment & Plan Note (Signed)
Advised patient to continue following with orthopedics and take medicines as they have prescribed

## 2017-05-25 NOTE — Assessment & Plan Note (Signed)
Chronic problem Likely worsened by some of her medications such as tramadol Discuss possibility of medication targeted for opioid-induced constipation such as Movantik or Amitiza, patient states she does not want to take any more medications at this time Instead, we will try Mira lax Discussed starting with 1 cap full daily and titrating to one soft bowel movement daily

## 2017-05-25 NOTE — Assessment & Plan Note (Signed)
Elevated today, but likely has some component of white coat syndrome as home BPs are so well controlled No changes today Continue current meds Recent BMP reviewed

## 2017-06-02 ENCOUNTER — Other Ambulatory Visit: Payer: Self-pay

## 2017-06-02 MED ORDER — MELOXICAM 7.5 MG PO TABS
7.5000 mg | ORAL_TABLET | Freq: Every day | ORAL | 5 refills | Status: DC
Start: 2017-06-02 — End: 2017-12-04

## 2017-06-02 NOTE — Telephone Encounter (Signed)
Patient reports that the pharmacy told her that she does not have any more refills on her Meloxicam. She is currently out of medication. Could you send in another refill into [facility] in [place]? Please call the patient once this has been done. Thanks!

## 2017-06-15 DIAGNOSIS — H40003 Preglaucoma, unspecified, bilateral: Secondary | ICD-10-CM | POA: Diagnosis not present

## 2017-06-23 DIAGNOSIS — M5416 Radiculopathy, lumbar region: Secondary | ICD-10-CM | POA: Diagnosis not present

## 2017-06-23 DIAGNOSIS — M5136 Other intervertebral disc degeneration, lumbar region: Secondary | ICD-10-CM | POA: Diagnosis not present

## 2017-06-23 DIAGNOSIS — M48062 Spinal stenosis, lumbar region with neurogenic claudication: Secondary | ICD-10-CM | POA: Diagnosis not present

## 2017-07-09 DIAGNOSIS — F99 Mental disorder, not otherwise specified: Secondary | ICD-10-CM | POA: Diagnosis not present

## 2017-07-09 DIAGNOSIS — M5416 Radiculopathy, lumbar region: Secondary | ICD-10-CM | POA: Diagnosis not present

## 2017-07-09 DIAGNOSIS — F0280 Dementia in other diseases classified elsewhere without behavioral disturbance: Secondary | ICD-10-CM | POA: Diagnosis not present

## 2017-07-09 DIAGNOSIS — E041 Nontoxic single thyroid nodule: Secondary | ICD-10-CM | POA: Diagnosis not present

## 2017-07-09 DIAGNOSIS — F0150 Vascular dementia without behavioral disturbance: Secondary | ICD-10-CM | POA: Diagnosis not present

## 2017-07-09 DIAGNOSIS — G309 Alzheimer's disease, unspecified: Secondary | ICD-10-CM | POA: Diagnosis not present

## 2017-07-21 DIAGNOSIS — M48062 Spinal stenosis, lumbar region with neurogenic claudication: Secondary | ICD-10-CM | POA: Diagnosis not present

## 2017-07-21 DIAGNOSIS — M5136 Other intervertebral disc degeneration, lumbar region: Secondary | ICD-10-CM | POA: Diagnosis not present

## 2017-07-21 DIAGNOSIS — M62830 Muscle spasm of back: Secondary | ICD-10-CM | POA: Diagnosis not present

## 2017-07-21 DIAGNOSIS — M5416 Radiculopathy, lumbar region: Secondary | ICD-10-CM | POA: Diagnosis not present

## 2017-08-03 DIAGNOSIS — M48062 Spinal stenosis, lumbar region with neurogenic claudication: Secondary | ICD-10-CM | POA: Diagnosis not present

## 2017-08-03 DIAGNOSIS — M5136 Other intervertebral disc degeneration, lumbar region: Secondary | ICD-10-CM | POA: Diagnosis not present

## 2017-08-03 DIAGNOSIS — M5416 Radiculopathy, lumbar region: Secondary | ICD-10-CM | POA: Diagnosis not present

## 2017-08-10 ENCOUNTER — Other Ambulatory Visit: Payer: Self-pay | Admitting: Family Medicine

## 2017-08-23 DIAGNOSIS — Z0000 Encounter for general adult medical examination without abnormal findings: Secondary | ICD-10-CM | POA: Diagnosis not present

## 2017-09-11 ENCOUNTER — Other Ambulatory Visit: Payer: Self-pay | Admitting: Family Medicine

## 2017-09-14 DIAGNOSIS — M5136 Other intervertebral disc degeneration, lumbar region: Secondary | ICD-10-CM | POA: Diagnosis not present

## 2017-09-14 DIAGNOSIS — M48062 Spinal stenosis, lumbar region with neurogenic claudication: Secondary | ICD-10-CM | POA: Diagnosis not present

## 2017-09-14 DIAGNOSIS — M62830 Muscle spasm of back: Secondary | ICD-10-CM | POA: Diagnosis not present

## 2017-09-14 DIAGNOSIS — M5416 Radiculopathy, lumbar region: Secondary | ICD-10-CM | POA: Diagnosis not present

## 2017-10-11 DIAGNOSIS — M5136 Other intervertebral disc degeneration, lumbar region: Secondary | ICD-10-CM | POA: Diagnosis not present

## 2017-10-11 DIAGNOSIS — M48062 Spinal stenosis, lumbar region with neurogenic claudication: Secondary | ICD-10-CM | POA: Diagnosis not present

## 2017-10-11 DIAGNOSIS — M5416 Radiculopathy, lumbar region: Secondary | ICD-10-CM | POA: Diagnosis not present

## 2017-10-12 ENCOUNTER — Other Ambulatory Visit: Payer: Self-pay | Admitting: Family Medicine

## 2017-10-13 ENCOUNTER — Telehealth: Payer: Self-pay

## 2017-10-13 ENCOUNTER — Encounter: Payer: Self-pay | Admitting: Family Medicine

## 2017-10-13 ENCOUNTER — Ambulatory Visit (INDEPENDENT_AMBULATORY_CARE_PROVIDER_SITE_OTHER): Payer: PPO | Admitting: Family Medicine

## 2017-10-13 VITALS — BP 120/70 | HR 79 | Temp 98.1°F | Resp 16 | Wt 116.0 lb

## 2017-10-13 DIAGNOSIS — I10 Essential (primary) hypertension: Secondary | ICD-10-CM | POA: Diagnosis not present

## 2017-10-13 DIAGNOSIS — F339 Major depressive disorder, recurrent, unspecified: Secondary | ICD-10-CM

## 2017-10-13 NOTE — Assessment & Plan Note (Signed)
Currently well controlled Continue current meds Recheck BMP F/u at CPE

## 2017-10-13 NOTE — Telephone Encounter (Signed)
Ok to D/C this?

## 2017-10-13 NOTE — Telephone Encounter (Signed)
Patient called office to let nurse kno that she is not actively on Clopidogrel but had been on medication in past. Becky Prince

## 2017-10-13 NOTE — Progress Notes (Signed)
Patient: Becky Prince Female    DOB: 1926/01/20   84 y.o.   MRN: [id number] Visit Date: 10/13/2017  Today's Provider: [name], MD   I, Becky Prince, CMA, am acting as scribe for Becky Paganini, MD.  No chief complaint on file.  Subjective:    Depression         Chronicity: FU from 05/24/2017. Pt was started on Remeron at [facility].  Progression since onset: stable per pt.  Associated symptoms include decreased concentration, fatigue, irritable, restlessness, decreased interest, headaches and sad.  Associated symptoms include no helplessness, no hopelessness, does not have insomnia, no appetite change, no indigestion and no suicidal ideas.  Compliance with treatment is good. She is unsure whether she is still taking Celexa.  HTN: elevated at previous visit. States home BPs are similar to today.  Taking medications regularly.  Denies CP, SOB, vision changes, HA, medication SEs.    Allergies  Allergen Reactions  . Aricept [Donepezil Hcl]     Dizziness and insomnia  . Naproxen Swelling    Had lip swelling 20 yrs ago - not sure if caused by oxycodone or naproxen  . Oxycodone-Aspirin Swelling    Had lip swelling 20 yrs ago. Not sure if caused by oxycodone or naproxen.  . Prednisone Diarrhea     Current Outpatient Medications:  .  amLODipine (NORVASC) 10 MG tablet, TAKE 1 TABLET BY MOUTH EVERY DAY, Disp: 30 tablet, Rfl: 11 .  aspirin EC 81 MG tablet, Take by mouth., Disp: , Rfl:  .  atorvastatin (LIPITOR) 40 MG tablet, TAKE 1 TABLET BY MOUTH DAILY AT 6 PM., Disp: 30 tablet, Rfl: 4 .  donepezil (ARICEPT) 5 MG tablet, *(MD CALL BACK0*TAKE 1 TABLET BY MOUTH EVERY DAY AT NIGHT, Disp: , Rfl: 3 .  fluticasone (FLONASE) 50 MCG/ACT nasal spray, SPRAY 2 SPRAYS INTO EACH NOSTRIL EVERY DAY, Disp: 48 g, Rfl: 1 .  meloxicam (MOBIC) 7.5 MG tablet, Take 1 tablet (7.5 mg total) by mouth daily., Disp: 30 tablet, Rfl: 5 .  mirtazapine (REMERON) 15 MG tablet, TAKE 1 TABLET BY MOUTH  EVERYDAY AT BEDTIME, Disp: 90 tablet, Rfl: 1 .  Multiple Vitamins-Minerals (MULTIVITAMIN) tablet, Take 1 tablet by mouth daily., Disp: 30 tablet, Rfl: 0 .  Omega-3 Fatty Acids (FISH OIL) 1200 MG CAPS, Take 1 capsule by mouth every other day. , Disp: 60 capsule, Rfl: 0 .  spironolactone (ALDACTONE) 25 MG tablet, TAKE 1 TABLET BY MOUTH EVERY DAY, Disp: 30 tablet, Rfl: 12 .  traMADol (ULTRAM) 50 MG tablet, Take 1 tablet (50 mg total) by mouth every 6 (six) hours as needed., Disp: 30 tablet, Rfl: 0 .  citalopram (CELEXA) 20 MG tablet, TAKE ONE TABLET BY MOUTH ONCE DAILY (Patient not taking: Reported on 10/13/2017), Disp: 90 tablet, Rfl: 1 .  clopidogrel (PLAVIX) 75 MG tablet, , Disp: , Rfl:   Review of Systems  Constitutional: Positive for fatigue. Negative for activity change, appetite change, chills and diaphoresis.  Musculoskeletal: Positive for arthralgias.  Neurological: Positive for headaches.  Psychiatric/Behavioral: Positive for decreased concentration and depression. Negative for suicidal ideas. The patient does not have insomnia.     Social History   Tobacco Use  . Smoking status: Never Smoker  . Smokeless tobacco: Never Used  Substance Use Topics  . Alcohol use: No    Alcohol/week: 0.0 oz   Objective:   BP 120/70 (BP Location: Left Arm, Patient Position: Sitting, Cuff Size: Normal)   Pulse 79  Temp 98.1 F (36.7 C) (Oral)   Resp 16   Wt 116 lb (52.6 kg)   SpO2 95%   BMI 17.64 kg/m  Vitals:   10/13/17 1346  BP: 120/70  Pulse: 79  Resp: 16  Temp: 98.1 F (36.7 C)  TempSrc: Oral  SpO2: 95%  Weight: 116 lb (52.6 kg)     Physical Exam  Constitutional: She is oriented to person, place, and time. She appears well-developed and well-nourished. She is irritable. No distress.  HENT:  Head: Normocephalic and atraumatic.  Eyes: Conjunctivae are normal. No scleral icterus.  Neck: Neck supple. No thyromegaly present.  Cardiovascular: Normal rate, regular rhythm, normal  heart sounds and intact distal pulses.  No murmur heard. Pulmonary/Chest: Effort normal and breath sounds normal. No respiratory distress. She has no wheezes. She has no rales.  Musculoskeletal: She exhibits no edema.  Lymphadenopathy:    She has no cervical adenopathy.  Neurological: She is alert and oriented to person, place, and time.  Skin: Skin is warm and dry. Capillary refill takes less than 2 seconds. No rash noted.  Psychiatric: She has a normal mood and affect. Her behavior is normal.  Vitals reviewed.   Depression screen [facility] 2/9 10/13/2017 05/24/2017 03/10/2017 10/06/2016 09/11/2015  Decreased Interest 3 1 1  0 0  Down, Depressed, Hopeless 1 2 0 2 0  PHQ - 2 Score 4 3 1 2  0  Altered sleeping 0 3 - 2 -  Tired, decreased energy 1 2 - 2 -  Change in appetite 0 3 - 0 -  Feeling bad or failure about yourself  0 2 - 2 -  Trouble concentrating 0 0 - 3 -  Moving slowly or fidgety/restless 0 0 - 1 -  Suicidal thoughts 0 0 - 0 -  PHQ-9 Score 5 13 - 12 -  Difficult doing work/chores Not difficult at all Extremely dIfficult - Somewhat difficult -     Assessment & Plan:   Problem List Items Addressed This Visit      Cardiovascular and Mediastinum   BP (high blood pressure)    Currently well controlled Continue current meds Recheck BMP F/u at CPE      Relevant Orders   Basic Metabolic Panel (BMET)     Other   Depression, recurrent ([place]) - Primary    Chronic, improving Patient not interested in pursuing therapy at this time Continue citalopram and Remeron at current doses Hopeful that her Remeron will also help with her appetite low BMI          Return in about 5 months (around 03/15/2018) for CPE/AWV (after 03/10/18).   The entirety of the information documented in the History of Present Illness, Review of Systems and Physical Exam were personally obtained by me. Portions of this information were initially documented by Becky Prince, CMA and reviewed by me for  thoroughness and accuracy.    Becky Crews, MD, MPH [facility] 10/13/2017 2:28 PM

## 2017-10-13 NOTE — Telephone Encounter (Signed)
Pt agrees to speak with neuro about Plavix, and states she is taking the celexa.

## 2017-10-13 NOTE — Assessment & Plan Note (Signed)
Chronic, improving Patient not interested in pursuing therapy at this time Continue citalopram and Remeron at current doses Hopeful that her Remeron will also help with her appetite low BMI

## 2017-10-13 NOTE — Telephone Encounter (Signed)
She needs to discuss this with neurology at her appt that is coming.  From last note, appears that they believe she is taking Aspirin and Plavix.  She was supposed to call back about the celexa.  She should be taking this medication as well.  Virginia Crews, MD, MPH [facility] 10/13/2017 4:51 PM

## 2017-10-14 ENCOUNTER — Telehealth: Payer: Self-pay

## 2017-10-14 LAB — BASIC METABOLIC PANEL
BUN/Creatinine Ratio: 21 (ref 12–28)
BUN: 29 mg/dL — AB (ref 8–27)
CALCIUM: 9.2 mg/dL (ref 8.7–10.3)
CHLORIDE: 102 mmol/L (ref 96–106)
CO2: 25 mmol/L (ref 20–29)
Creatinine, Ser: 1.41 mg/dL — ABNORMAL HIGH (ref 0.57–1.00)
GFR calc non Af Amer: 33 mL/min/{1.73_m2} — ABNORMAL LOW (ref >59)
GFR, EST AFRICAN AMERICAN: 38 mL/min/{1.73_m2} — AB (ref >59)
GLUCOSE: 69 mg/dL (ref 65–99)
POTASSIUM: 4.7 mmol/L (ref 3.5–5.2)
Sodium: 141 mmol/L (ref 134–144)

## 2017-10-14 NOTE — Telephone Encounter (Signed)
Noted  Becky Prince, [name], MD, MPH [facility] 10/14/2017 12:51 PM

## 2017-10-14 NOTE — Telephone Encounter (Signed)
Left message advising pt. OK per DPR. 

## 2017-10-14 NOTE — Telephone Encounter (Signed)
-----   Message from Virginia Crews, MD sent at 10/14/2017  8:14 AM EDT ----- Stable kidney function.  Normal electrolytes and blood sugar.  Virginia Crews, MD, MPH [facility] 10/14/2017 8:14 AM

## 2017-10-15 DIAGNOSIS — F0150 Vascular dementia without behavioral disturbance: Secondary | ICD-10-CM | POA: Diagnosis not present

## 2017-10-15 DIAGNOSIS — F0280 Dementia in other diseases classified elsewhere without behavioral disturbance: Secondary | ICD-10-CM | POA: Diagnosis not present

## 2017-10-15 DIAGNOSIS — E041 Nontoxic single thyroid nodule: Secondary | ICD-10-CM | POA: Diagnosis not present

## 2017-10-15 DIAGNOSIS — R410 Disorientation, unspecified: Secondary | ICD-10-CM | POA: Diagnosis not present

## 2017-10-15 DIAGNOSIS — G309 Alzheimer's disease, unspecified: Secondary | ICD-10-CM | POA: Diagnosis not present

## 2017-10-15 DIAGNOSIS — M720 Palmar fascial fibromatosis [Dupuytren]: Secondary | ICD-10-CM | POA: Diagnosis not present

## 2017-10-15 DIAGNOSIS — M5416 Radiculopathy, lumbar region: Secondary | ICD-10-CM | POA: Diagnosis not present

## 2017-10-21 DIAGNOSIS — M25561 Pain in right knee: Secondary | ICD-10-CM | POA: Diagnosis not present

## 2017-10-21 DIAGNOSIS — M1711 Unilateral primary osteoarthritis, right knee: Secondary | ICD-10-CM | POA: Diagnosis not present

## 2017-12-04 ENCOUNTER — Other Ambulatory Visit: Payer: Self-pay | Admitting: Physician Assistant

## 2017-12-11 ENCOUNTER — Other Ambulatory Visit: Payer: Self-pay | Admitting: Family Medicine

## 2017-12-11 ENCOUNTER — Other Ambulatory Visit: Payer: Self-pay | Admitting: Physician Assistant

## 2017-12-11 DIAGNOSIS — J301 Allergic rhinitis due to pollen: Secondary | ICD-10-CM

## 2017-12-17 ENCOUNTER — Ambulatory Visit (INDEPENDENT_AMBULATORY_CARE_PROVIDER_SITE_OTHER): Payer: PPO | Admitting: Family Medicine

## 2017-12-17 ENCOUNTER — Encounter: Payer: Self-pay | Admitting: Family Medicine

## 2017-12-17 ENCOUNTER — Telehealth: Payer: Self-pay | Admitting: Family Medicine

## 2017-12-17 VITALS — BP 138/66 | HR 83 | Temp 97.6°F | Resp 16 | Wt 119.0 lb

## 2017-12-17 DIAGNOSIS — R600 Localized edema: Secondary | ICD-10-CM | POA: Diagnosis not present

## 2017-12-17 NOTE — Progress Notes (Signed)
Patient: Becky Prince Female    DOB: 1925-09-24   84 y.o.   MRN: [id number] Visit Date: 12/17/2017  Today's Provider: [name], MD   I, Martha Clan, CMA, am acting as scribe for [name], MD.  Chief Complaint  Patient presents with  . Leg Swelling   Subjective:    HPI    Edema: Patient complains of edema. The location of the edema is lower leg(s) left>right.  The edema has been constant.  Onset of symptoms was 3 days ago, unchanged since that time. The edema is present all day. The patient states never had this before.  The swelling has been aggravated by dependency of involved area, relieved by elevation of involved area, ice, and been associated with having to walk more recently has her husband has been in [place]. Cardiac risk factors include advanced age (older than 2 for men, 15 for women), dyslipidemia and hypertension.    Allergies  Allergen Reactions  . Aricept [Donepezil Hcl]     Dizziness and insomnia  . Naproxen Swelling    Had lip swelling 20 yrs ago - not sure if caused by oxycodone or naproxen  . Oxycodone-Aspirin Swelling    Had lip swelling 20 yrs ago. Not sure if caused by oxycodone or naproxen.  . Prednisone Diarrhea     Current Outpatient Medications:  .  amLODipine (NORVASC) 10 MG tablet, TAKE 1 TABLET BY MOUTH EVERY DAY, Disp: 30 tablet, Rfl: 11 .  aspirin EC 81 MG tablet, Take by mouth., Disp: , Rfl:  .  atorvastatin (LIPITOR) 40 MG tablet, TAKE 1 TABLET BY MOUTH DAILY AT 6 PM., Disp: 30 tablet, Rfl: 4 .  clopidogrel (PLAVIX) 75 MG tablet, , Disp: , Rfl:  .  donepezil (ARICEPT) 5 MG tablet, *(MD CALL BACK0*TAKE 1 TABLET BY MOUTH EVERY DAY AT NIGHT, Disp: , Rfl: 3 .  fluticasone (FLONASE) 50 MCG/ACT nasal spray, SPRAY 2 SPRAYS INTO EACH NOSTRIL EVERY DAY, Disp: 48 g, Rfl: 3 .  meloxicam (MOBIC) 7.5 MG tablet, TAKE 1 TABLET BY MOUTH EVERY DAY, Disp: 90 tablet, Rfl: 1 .  mirtazapine (REMERON) 15 MG tablet, TAKE 1 TABLET  BY MOUTH EVERYDAY AT BEDTIME, Disp: 90 tablet, Rfl: 1 .  Multiple Vitamins-Minerals (MULTIVITAMIN) tablet, Take 1 tablet by mouth daily., Disp: 30 tablet, Rfl: 0 .  Omega-3 Fatty Acids (FISH OIL) 1200 MG CAPS, Take 1 capsule by mouth every other day. , Disp: 60 capsule, Rfl: 0 .  spironolactone (ALDACTONE) 25 MG tablet, TAKE 1 TABLET BY MOUTH EVERY DAY, Disp: 30 tablet, Rfl: 12 .  traMADol (ULTRAM) 50 MG tablet, Take 1 tablet (50 mg total) by mouth every 6 (six) hours as needed., Disp: 30 tablet, Rfl: 0 .  citalopram (CELEXA) 20 MG tablet, TAKE ONE TABLET BY MOUTH ONCE DAILY (Patient not taking: Reported on 10/13/2017), Disp: 90 tablet, Rfl: 1  Review of Systems  Constitutional: Negative for activity change, appetite change, chills, diaphoresis, fatigue, fever and unexpected weight change.  Respiratory: Negative for shortness of breath.   Cardiovascular: Positive for leg swelling. Negative for chest pain and palpitations.  Neurological: Positive for headaches.    Social History   Tobacco Use  . Smoking status: Never Smoker  . Smokeless tobacco: Never Used  Substance Use Topics  . Alcohol use: No    Alcohol/week: 0.0 standard drinks   Objective:   BP 138/66 (BP Location: Left Arm, Patient Position: Sitting, Cuff Size: Normal)   Pulse  83   Temp 97.6 F (36.4 C) (Oral)   Resp 16   Wt 119 lb (54 kg)   SpO2 94%   BMI 18.09 kg/m  Vitals:   12/17/17 1112  BP: 138/66  Pulse: 83  Resp: 16  Temp: 97.6 F (36.4 C)  TempSrc: Oral  SpO2: 94%  Weight: 119 lb (54 kg)     Physical Exam  Constitutional: She is oriented to person, place, and time. She appears well-developed and well-nourished. No distress.  HENT:  Head: Normocephalic and atraumatic.  Eyes: Conjunctivae are normal. No scleral icterus.  Neck: Neck supple. No thyromegaly present.  Cardiovascular: Normal rate, regular rhythm, normal heart sounds and intact distal pulses.  No murmur heard. Pulmonary/Chest: Effort normal  and breath sounds normal. No respiratory distress. She has no wheezes. She has no rales.  Abdominal: Soft. She exhibits no distension. There is no tenderness.  Musculoskeletal: She exhibits edema (1+ to bilateral ankles and feet).  Lymphadenopathy:    She has no cervical adenopathy.  Neurological: She is alert and oriented to person, place, and time.  Skin: Skin is warm and dry. Capillary refill takes less than 2 seconds. No rash noted.  Psychiatric: She has a normal mood and affect. Her behavior is normal.  Vitals reviewed.      Assessment & Plan:     1. Bilateral lower extremity edema -New problem -Seems like it is dependent edema related to venous insufficiency -No other signs of volume overload on exam - Discussed importance of elevation and compression stockings -Discussed return precautions   Return if symptoms worsen or fail to improve.   The entirety of the information documented in the History of Present Illness, Review of Systems and Physical Exam were personally obtained by me. Portions of this information were initially documented by Raquel Sarna Ratchford, CMA and reviewed by me for thoroughness and accuracy.    Virginia Crews, MD, MPH [facility] 12/17/2017 4:56 PM

## 2017-12-17 NOTE — Telephone Encounter (Signed)
FYI

## 2017-12-17 NOTE — Telephone Encounter (Signed)
Pt's daughter Helene Kelp called asking about pt taking OTC medication for the selling in pt's left foot. I advised in general we do not treat over the phone. Helene Kelp asked if pt could be seen today. I advised of open appts and Helene Kelp requested pt be scheduled with Dr. B at 11 am today. Pt is on the schedule. Please advise. Thanks TNP

## 2017-12-17 NOTE — Patient Instructions (Signed)
Edema Edema is an abnormal buildup of fluids in your bodytissues. Edema is somewhatdependent on gravity to pull the fluid to the lowest place in your body. That makes the condition more common in the legs and thighs (lower extremities). Painless swelling of the feet and ankles is common and becomes more likely as you get older. It is also common in looser tissues, like around your eyes. When the affected area is squeezed, the fluid may move out of that spot and leave a dent for a few moments. This dent is called pitting. What are the causes? There are many possible causes of edema. Eating too much salt and being on your feet or sitting for a long time can cause edema in your legs and ankles. Hot weather may make edema worse. Common medical causes of edema include:  Heart failure.  Liver disease.  Kidney disease.  Weak blood vessels in your legs.  Cancer.  An injury.  Pregnancy.  Some medications.  Obesity.  What are the signs or symptoms? Edema is usually painless.Your skin may look swollen or shiny. How is this diagnosed? Your health care provider may be able to diagnose edema by asking about your medical history and doing a physical exam. You may need to have tests such as X-rays, an electrocardiogram, or blood tests to check for medical conditions that may cause edema. How is this treated? Edema treatment depends on the cause. If you have heart, liver, or kidney disease, you need the treatment appropriate for these conditions. General treatment may include:  Elevation of the affected body part above the level of your heart.  Compression of the affected body part. Pressure from elastic bandages or support stockings squeezes the tissues and forces fluid back into the blood vessels. This keeps fluid from entering the tissues.  Restriction of fluid and salt intake.  Use of a water pill (diuretic). These medications are appropriate only for some types of edema. They pull fluid  out of your body and make you urinate more often. This gets rid of fluid and reduces swelling, but diuretics can have side effects. Only use diuretics as directed by your health care provider.  Follow these instructions at home:  Keep the affected body part above the level of your heart when you are lying down.  Do not sit still or stand for prolonged periods.  Do not put anything directly under your knees when lying down.  Do not wear constricting clothing or garters on your upper legs.  Exercise your legs to work the fluid back into your blood vessels. This may help the swelling go down.  Wear elastic bandages or support stockings to reduce ankle swelling as directed by your health care provider.  Eat a low-salt diet to reduce fluid if your health care provider recommends it.  Only take medicines as directed by your health care provider. Contact a health care provider if:  Your edema is not responding to treatment.  You have heart, liver, or kidney disease and notice symptoms of edema.  You have edema in your legs that does not improve after elevating them.  You have sudden and unexplained weight gain. Get help right away if:  You develop shortness of breath or chest pain.  You cannot breathe when you lie down.  You develop pain, redness, or warmth in the swollen areas.  You have heart, liver, or kidney disease and suddenly get edema.  You have a fever and your symptoms suddenly get worse. This information is   not intended to replace advice given to you by your health care provider. Make sure you discuss any questions you have with your health care provider. Document Released: 04/13/2005 Document Revised: 09/19/2015 Document Reviewed: 02/03/2013 Elsevier Interactive Patient Education  2017 Elsevier Inc.  

## 2017-12-17 NOTE — Telephone Encounter (Signed)
Will evaluate at upcoming [place], [name], MD, MPH [facility] 12/17/2017 9:40 AM

## 2017-12-17 NOTE — Telephone Encounter (Signed)
Any suggestions>?

## 2017-12-17 NOTE — Telephone Encounter (Signed)
Pt stated her husband is in the hospital and they are sure if he will make it. Pt stated her leg has been swelling she thinks it because she has been on it so much going back and forth to the hospital. Pt is requesting call back to advise what she can try. Pt was offered OV and stated that she can't come in. Please advise. Thanks TNP

## 2018-01-11 DIAGNOSIS — M5136 Other intervertebral disc degeneration, lumbar region: Secondary | ICD-10-CM | POA: Diagnosis not present

## 2018-01-11 DIAGNOSIS — M5416 Radiculopathy, lumbar region: Secondary | ICD-10-CM | POA: Diagnosis not present

## 2018-01-11 DIAGNOSIS — M48062 Spinal stenosis, lumbar region with neurogenic claudication: Secondary | ICD-10-CM | POA: Diagnosis not present

## 2018-01-19 ENCOUNTER — Ambulatory Visit: Payer: PPO

## 2018-01-21 DIAGNOSIS — M5416 Radiculopathy, lumbar region: Secondary | ICD-10-CM | POA: Diagnosis not present

## 2018-01-21 DIAGNOSIS — M62830 Muscle spasm of back: Secondary | ICD-10-CM | POA: Diagnosis not present

## 2018-01-21 DIAGNOSIS — M48062 Spinal stenosis, lumbar region with neurogenic claudication: Secondary | ICD-10-CM | POA: Diagnosis not present

## 2018-01-21 DIAGNOSIS — M5136 Other intervertebral disc degeneration, lumbar region: Secondary | ICD-10-CM | POA: Diagnosis not present

## 2018-02-01 ENCOUNTER — Other Ambulatory Visit: Payer: Self-pay | Admitting: Family Medicine

## 2018-03-11 ENCOUNTER — Ambulatory Visit: Payer: PPO

## 2018-03-14 DIAGNOSIS — M48062 Spinal stenosis, lumbar region with neurogenic claudication: Secondary | ICD-10-CM | POA: Diagnosis not present

## 2018-03-14 DIAGNOSIS — M5136 Other intervertebral disc degeneration, lumbar region: Secondary | ICD-10-CM | POA: Diagnosis not present

## 2018-03-14 DIAGNOSIS — M5416 Radiculopathy, lumbar region: Secondary | ICD-10-CM | POA: Diagnosis not present

## 2018-03-17 ENCOUNTER — Ambulatory Visit (INDEPENDENT_AMBULATORY_CARE_PROVIDER_SITE_OTHER): Payer: PPO | Admitting: Family Medicine

## 2018-03-17 ENCOUNTER — Encounter: Payer: Self-pay | Admitting: Family Medicine

## 2018-03-17 VITALS — BP 108/58 | HR 75 | Temp 98.2°F | Ht 68.0 in | Wt 120.0 lb

## 2018-03-17 DIAGNOSIS — I10 Essential (primary) hypertension: Secondary | ICD-10-CM | POA: Diagnosis not present

## 2018-03-17 DIAGNOSIS — F0280 Dementia in other diseases classified elsewhere without behavioral disturbance: Secondary | ICD-10-CM | POA: Diagnosis not present

## 2018-03-17 DIAGNOSIS — F0150 Vascular dementia without behavioral disturbance: Secondary | ICD-10-CM | POA: Diagnosis not present

## 2018-03-17 DIAGNOSIS — G309 Alzheimer's disease, unspecified: Secondary | ICD-10-CM

## 2018-03-17 DIAGNOSIS — Z79899 Other long term (current) drug therapy: Secondary | ICD-10-CM

## 2018-03-17 DIAGNOSIS — Z23 Encounter for immunization: Secondary | ICD-10-CM

## 2018-03-17 DIAGNOSIS — E7800 Pure hypercholesterolemia, unspecified: Secondary | ICD-10-CM | POA: Diagnosis not present

## 2018-03-17 DIAGNOSIS — M720 Palmar fascial fibromatosis [Dupuytren]: Secondary | ICD-10-CM | POA: Diagnosis not present

## 2018-03-17 DIAGNOSIS — E0590 Thyrotoxicosis, unspecified without thyrotoxic crisis or storm: Secondary | ICD-10-CM

## 2018-03-17 DIAGNOSIS — Z0000 Encounter for general adult medical examination without abnormal findings: Secondary | ICD-10-CM

## 2018-03-17 NOTE — Progress Notes (Signed)
Patient: Becky Prince, Female    DOB: Dec 14, 1925, 84 y.o.   MRN: [id number] Visit Date: 03/17/2018  Today's Provider: [name], MD   Chief Complaint  Patient presents with  . Medicare Wellness   Subjective:  I, [name], CMA, am acting as a scribe for [name], MD.    Annual wellness visit Becky Prince is a 84 y.o. female who presents today for her Subsequent Annual Wellness Visit. She feels well. She reports exercising none. She reports she is sleeping well.  Was previously seeing Dr Manuella Ghazi for TIA and dementia.  She is no longer.  She is very defensive about her memory.  She is taking Aricept.  She is now living alone as her husband passed away within the last several months.  She continues to drive.  Patient is also worried about contractures of bilateral hands.  These of been there for several years, but she believes over the last several months that other fingers have started contracting as well.  She wonders if there is a specialist she can see for this. -----------------------------------------------------------   Review of Systems  Constitutional: Negative.   HENT: Negative.   Eyes: Negative.   Respiratory: Negative.   Cardiovascular: Negative.   Gastrointestinal: Negative.   Endocrine: Negative.   Genitourinary: Negative.   Musculoskeletal: Negative.   Skin: Negative.   Allergic/Immunologic: Negative.   Neurological: Negative.   Hematological: Bruises/bleeds easily.  Psychiatric/Behavioral: Negative.     Social History   Socioeconomic History  . Marital status: Married    Spouse name: Not on file  . Number of children: Not on file  . Years of education: Not on file  . Highest education level: Not on file  Occupational History  . Not on file  Social Needs  . Financial resource strain: Not on file  . Food insecurity:    Worry: Not on file    Inability: Not on file  . Transportation needs:    Medical: Not on file    Non-medical: Not  on file  Tobacco Use  . Smoking status: Never Smoker  . Smokeless tobacco: Never Used  Substance and Sexual Activity  . Alcohol use: No    Alcohol/week: 0.0 standard drinks  . Drug use: No  . Sexual activity: Not on file  Lifestyle  . Physical activity:    Days per week: Not on file    Minutes per session: Not on file  . Stress: Not on file  Relationships  . Social connections:    Talks on phone: Not on file    Gets together: Not on file    Attends religious service: Not on file    Active member of club or organization: Not on file    Attends meetings of clubs or organizations: Not on file    Relationship status: Not on file  . Intimate partner violence:    Fear of current or ex partner: Not on file    Emotionally abused: Not on file    Physically abused: Not on file    Forced sexual activity: Not on file  Other Topics Concern  . Not on file  Social History Narrative  . Not on file    Patient Active Problem List   Diagnosis Date Noted  . 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 Dupuytren's contracture of both hands 03/17/2018  . Hyponatremia 10/06/2016  . Cerebrovascular disease 10/06/2016  . Multinodular goiter 10/06/2016  . TIA (transient ischemic attack) 09/28/2016  . Osteoporosis 10/14/2015  .  Right leg pain 10/14/2015  . Depression, recurrent ([place]) 04/12/2015  . CN (constipation) 04/12/2015  . Contracture of finger joint 04/12/2015  . D (diarrhea) 04/12/2015  . Fatigue 04/12/2015  . Hypercholesteremia 04/12/2015  . BP (high blood pressure) 04/12/2015  . Allergic rhinitis, seasonal 04/12/2015  . Subclinical hyperthyroidism 11/16/2014    Past Surgical History:  Procedure Laterality Date  . ABDOMINAL HYSTERECTOMY    . CATARACT EXTRACTION W/PHACO Right 06/12/2015   Procedure: CATARACT EXTRACTION PHACO AND INTRAOCULAR LENS PLACEMENT (IOC);  Surgeon: Leandrew Koyanagi, MD;  Location: [facility];  Service: Ophthalmology;  Laterality: Right;    Her family  history includes Cancer in her father and sister; Heart disease in her mother; Hypertension in her mother; Kidney disease in her brother.     Previous Medications   AMLODIPINE (NORVASC) 10 MG TABLET    TAKE 1 TABLET BY MOUTH EVERY DAY   ASPIRIN 120 MG SUPPOSITORY    TAKE 1 TABLET BY MOUTH EVERY DAY   ATORVASTATIN (LIPITOR) 40 MG TABLET    TAKE 1 TABLET BY MOUTH DAILY AT 6 PM.   CITALOPRAM (CELEXA) 20 MG TABLET    TAKE ONE TABLET BY MOUTH ONCE DAILY   CLOPIDOGREL (PLAVIX) 75 MG TABLET       DONEPEZIL (ARICEPT) 5 MG TABLET    *(MD CALL BACK0*TAKE 1 TABLET BY MOUTH EVERY DAY AT NIGHT   FLUTICASONE (FLONASE) 50 MCG/ACT NASAL SPRAY    SPRAY 2 SPRAYS INTO EACH NOSTRIL EVERY DAY   GABAPENTIN (NEURONTIN) 100 MG CAPSULE    Take 100 mg by mouth 2 (two) times daily.   MELOXICAM (MOBIC) 7.5 MG TABLET    TAKE 1 TABLET BY MOUTH EVERY DAY   MIRTAZAPINE (REMERON) 15 MG TABLET    TAKE 1 TABLET BY MOUTH EVERYDAY AT BEDTIME   MULTIPLE VITAMINS-MINERALS (MULTIVITAMIN) TABLET    Take 1 tablet by mouth daily.   OMEGA-3 FATTY ACIDS (FISH OIL) 1200 MG CAPS    Take 1 capsule by mouth every other day.    SPIRONOLACTONE (ALDACTONE) 25 MG TABLET    TAKE 1 TABLET BY MOUTH EVERY DAY   TRAMADOL (ULTRAM) 50 MG TABLET    Take 1 tablet (50 mg total) by mouth every 6 (six) hours as needed.    Patient Care Team: Virginia Crews, MD as PCP - General (Family Medicine) [name], MD as Referring Physician (Physical Medicine and Rehabilitation)      Objective:   Vitals: BP (!) 108/58 (BP Location: Right Arm, Patient Position: Sitting, Cuff Size: Normal)   Pulse 75   Temp 98.2 F (36.8 C) (Oral)   Ht 5\' 8"  (1.727 m)   Wt 120 lb (54.4 kg)   SpO2 96%   BMI 18.25 kg/m   Physical Exam  Constitutional: She is oriented to person, place, and time. She appears well-developed and well-nourished. No distress.  HENT:  Head: Normocephalic and atraumatic.  Right Ear: External ear normal.  Left Ear: External ear  normal.  Nose: Nose normal.  Mouth/Throat: Oropharynx is clear and moist.  Eyes: Pupils are equal, round, and reactive to light. Conjunctivae and EOM are normal. No scleral icterus.  Neck: Neck supple. No thyromegaly present.  Cardiovascular: Normal rate, regular rhythm and intact distal pulses.  Murmur heard. Pulmonary/Chest: Effort normal and breath sounds normal. No respiratory distress. She has no wheezes. She has no rales.  Abdominal: Soft. Bowel sounds are normal. She exhibits no distension. There is no tenderness. There is no rebound and  no guarding.  Musculoskeletal: She exhibits deformity (contractures of bilateral fingers). She exhibits no edema.  Lymphadenopathy:    She has no cervical adenopathy.  Neurological: She is alert and oriented to person, place, and time.  Skin: Skin is warm and dry. Capillary refill takes less than 2 seconds. No rash noted.  Psychiatric: She has a normal mood and affect. Her behavior is normal.  Vitals reviewed.   Activities of Daily Living In your present state of health, do you have any difficulty performing the following activities: 03/17/2018  Hearing? N  Vision? N  Difficulty concentrating or making decisions? N  Walking or climbing stairs? N  Dressing or bathing? N  Doing errands, shopping? N  Some recent data might be hidden    Fall Risk Assessment Fall Risk  03/17/2018 03/10/2017 08/14/2016 09/11/2015 09/11/2015  Falls in the past year? 0 Yes No Yes No  Number falls in past yr: 0 2 or more - 1 -  Injury with Fall? - No - - -  Follow up - Falls prevention discussed - - -     Depression Screen PHQ 2/9 Scores 03/17/2018 10/13/2017 05/24/2017 03/10/2017  PHQ - 2 Score 0 4 3 1   PHQ- 9 Score 0 5 13 -    Cognitive Testing - 6-CIT  Correct? Score   What year is it? yes 0 0 or 4  What month is it? yes 0 0 or 3  Memorize:    Pia Mau,  44,  [address]  [place],      What time is it? (within 1 hour) yes 0 0 or 3  Count backwards from  20 yes 0 0, 2, or 4  Name the months of the year yes 0 0, 2, or 4  Repeat name & address above yes 5 0, 2, 4, 6, 8, or 10       TOTAL SCORE  5/28   Interpretation:  Abnormal- 5  Normal (0-7) Abnormal (8-28)      Assessment & Plan:     Annual Wellness Visit  Reviewed patient's Family Medical History Reviewed and updated list of patient's medical providers Assessment of cognitive impairment was done Assessed patient's functional ability Established a written schedule for health screening [place] Completed and Reviewed  Exercise Activities and Dietary recommendations Goals    . Increase water intake     Recommend increasing water intake to 4-6 glasses a day.        Immunization History  Administered Date(s) Administered  . Influenza, High Dose Seasonal PF 02/09/2015, 03/06/2016, 01/18/2017, 03/17/2018  . Pneumococcal Conjugate-13 08/29/2014  . Pneumococcal Polysaccharide-23 03/06/2016  . Td 03/26/2003, 01/10/2011  . Tdap 01/10/2011  . Zoster 10/14/2006    Health Maintenance  Topic Date Due  . Samul Dada  01/09/2021  . INFLUENZA VACCINE  Completed  . DEXA SCAN  Completed  . PNA vac Low Risk Adult  Completed     Discussed health benefits of physical activity, and encouraged her to engage in regular exercise appropriate for her age and condition.    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[place])    Chronic and stable Was  previously followed by neurology, but she has stopped seeing them She is doing well on Aricept 5 mg daily She is resistant to  increase this She has become more defensive about her memory especially when she has to do the cognitive testing today for her annual wellness We will continue to monitor and ensure that she is still safe to live independently      Relevant Medications   aspirin 120 MG suppository   gabapentin (NEURONTIN) 100 MG capsule     Endocrine   Subclinical hyperthyroidism    Not currently on any medications Recheck TSH and free T4      Relevant Orders   TSH   T4, free     Other   Hypercholesteremia    Previously well controlled Given that she has history of CVA, we will continue her statin at current dose despite her dementia Recheck lipid panel and CMP Of note, note, patient is not fasting      Relevant Medications   aspirin 120 MG suppository   Other Relevant Orders   Lipid panel   Comprehensive metabolic panel   Dupuytren's contracture of both hands    Both hands have Dupuytren's contractures This is worsening and involving more fingers than it previously was We will refer to hand surgery to see if there is any management that may help her retain function She may also benefit from occupational therapy in the future      Relevant Orders   Ambulatory referral to Hand Surgery    Other Visit Diagnoses    Medicare annual wellness visit, subsequent    -  Primary   Long-term use of high-risk medication       Relevant Orders   CBC   Need for influenza vaccination       Relevant Orders   Flu vaccine HIGH DOSE PF (Completed)       Return in about 6 months (around 09/15/2018) for chronic disease f/u.   The entirety of the information documented in the History of Present Illness, Review of Systems and Physical Exam were personally obtained by me. Portions of this information were initially documented by [name], CMA and reviewed by me for thoroughness and accuracy.    Virginia Crews, MD, MPH [facility] 03/17/2018 4:07 PM

## 2018-03-17 NOTE — Assessment & Plan Note (Signed)
Both hands have Dupuytren's contractures This is worsening and involving more fingers than it previously was We will refer to hand surgery to see if there is any management that may help her retain function She may also benefit from occupational therapy in the future

## 2018-03-17 NOTE — Assessment & Plan Note (Signed)
Not currently on any medications Recheck TSH and free T4

## 2018-03-17 NOTE — Assessment & Plan Note (Signed)
Chronic and stable Was previously followed by neurology, but she has stopped seeing them She is doing well on Aricept 5 mg daily She is resistant to increase this She has become more defensive about her memory especially when she has to do the cognitive testing today for her annual wellness We will continue to monitor and ensure that she is still safe to live independently

## 2018-03-17 NOTE — Patient Instructions (Signed)

## 2018-03-17 NOTE — Assessment & Plan Note (Signed)
Previously well controlled Given that she has history of CVA, we will continue her statin at current dose despite her dementia Recheck lipid panel and CMP Of note, note, patient is not fasting

## 2018-03-17 NOTE — Assessment & Plan Note (Signed)
Currently well controlled Continue current meds Recheck CMP Follow-up in 6 months

## 2018-03-18 ENCOUNTER — Telehealth: Payer: Self-pay | Admitting: *Deleted

## 2018-03-18 DIAGNOSIS — N289 Disorder of kidney and ureter, unspecified: Secondary | ICD-10-CM

## 2018-03-18 LAB — LIPID PANEL
CHOL/HDL RATIO: 2.2 ratio (ref 0.0–4.4)
CHOLESTEROL TOTAL: 135 mg/dL (ref 100–199)
HDL: 62 mg/dL (ref >39)
LDL Calculated: 54 mg/dL (ref 0–99)
Triglycerides: 95 mg/dL (ref 0–149)
VLDL CHOLESTEROL CAL: 19 mg/dL (ref 5–40)

## 2018-03-18 LAB — CBC
Hematocrit: 37.9 % (ref 34.0–46.6)
Hemoglobin: 12.3 g/dL (ref 11.1–15.9)
MCH: 30.7 pg (ref 26.6–33.0)
MCHC: 32.5 g/dL (ref 31.5–35.7)
MCV: 95 fL (ref 79–97)
PLATELETS: 249 10*3/uL (ref 150–450)
RBC: 4.01 x10E6/uL (ref 3.77–5.28)
RDW: 11.3 % — AB (ref 12.3–15.4)
WBC: 7 10*3/uL (ref 3.4–10.8)

## 2018-03-18 LAB — COMPREHENSIVE METABOLIC PANEL
ALT: 9 IU/L (ref 0–32)
AST: 19 IU/L (ref 0–40)
Albumin/Globulin Ratio: 1.8 (ref 1.2–2.2)
Albumin: 4.2 g/dL (ref 3.2–4.6)
Alkaline Phosphatase: 120 IU/L — ABNORMAL HIGH (ref 39–117)
BUN/Creatinine Ratio: 18 (ref 12–28)
BUN: 29 mg/dL (ref 10–36)
Bilirubin Total: <0.2 mg/dL (ref 0.0–1.2)
CALCIUM: 9.5 mg/dL (ref 8.7–10.3)
CO2: 23 mmol/L (ref 20–29)
Chloride: 100 mmol/L (ref 96–106)
Creatinine, Ser: 1.63 mg/dL — ABNORMAL HIGH (ref 0.57–1.00)
GFR, EST AFRICAN AMERICAN: 32 mL/min/{1.73_m2} — AB (ref >59)
GFR, EST NON AFRICAN AMERICAN: 28 mL/min/{1.73_m2} — AB (ref >59)
GLUCOSE: 188 mg/dL — AB (ref 65–99)
Globulin, Total: 2.4 g/dL (ref 1.5–4.5)
POTASSIUM: 4.6 mmol/L (ref 3.5–5.2)
Sodium: 140 mmol/L (ref 134–144)
TOTAL PROTEIN: 6.6 g/dL (ref 6.0–8.5)

## 2018-03-18 LAB — T4, FREE: FREE T4: 1.33 ng/dL (ref 0.82–1.77)

## 2018-03-18 LAB — TSH: TSH: 0.906 u[IU]/mL (ref 0.450–4.500)

## 2018-03-18 NOTE — Telephone Encounter (Signed)
Patient was notified of results. Patient stated she is okay with referral to Nephrology and will stop taking spironolactone. Referral in epic.

## 2018-03-18 NOTE — Telephone Encounter (Signed)
-----   Message from Virginia Crews, MD sent at 03/18/2018  8:29 AM EST ----- Cholesterol, liver function, electrolytes, Thyroid function, Blood counts all look good.  Kidney function continues to slowly worsen.  I recommend that patient hold spironolactone and we refer to Nephrology (kidney specialist).  We can place referral if she is ok with it.

## 2018-03-19 ENCOUNTER — Other Ambulatory Visit: Payer: Self-pay | Admitting: Family Medicine

## 2018-04-01 DIAGNOSIS — H40003 Preglaucoma, unspecified, bilateral: Secondary | ICD-10-CM | POA: Diagnosis not present

## 2018-04-05 DIAGNOSIS — M720 Palmar fascial fibromatosis [Dupuytren]: Secondary | ICD-10-CM | POA: Diagnosis not present

## 2018-04-28 DIAGNOSIS — N179 Acute kidney failure, unspecified: Secondary | ICD-10-CM | POA: Diagnosis not present

## 2018-04-28 DIAGNOSIS — R634 Abnormal weight loss: Secondary | ICD-10-CM | POA: Diagnosis not present

## 2018-04-28 DIAGNOSIS — I129 Hypertensive chronic kidney disease with stage 1 through stage 4 chronic kidney disease, or unspecified chronic kidney disease: Secondary | ICD-10-CM | POA: Diagnosis not present

## 2018-04-28 DIAGNOSIS — N183 Chronic kidney disease, stage 3 (moderate): Secondary | ICD-10-CM | POA: Diagnosis not present

## 2018-05-02 LAB — BASIC METABOLIC PANEL
BUN: 27 — AB (ref 4–21)
CREATININE: 1.5 — AB (ref 0.5–1.1)
Glucose: 89
POTASSIUM: 4.7 (ref 3.4–5.3)
SODIUM: 140 (ref 137–147)

## 2018-05-03 ENCOUNTER — Other Ambulatory Visit (HOSPITAL_COMMUNITY): Payer: Self-pay | Admitting: Nephrology

## 2018-05-03 ENCOUNTER — Other Ambulatory Visit: Payer: Self-pay | Admitting: Nephrology

## 2018-05-03 DIAGNOSIS — N183 Chronic kidney disease, stage 3 unspecified: Secondary | ICD-10-CM

## 2018-05-04 LAB — CHLORIDE
Albumin: 4.5
CHLORIDE: 105
CO2: 27
Calcium: 9.8
KAPPA/LAMBDA FLC RATIO: 29.4
Lambda Light Chain, Free U: 25
Magnesium: 1.7
PTH: 31
Phosphorus: 3.6
URIC ACID: 5.9

## 2018-05-09 DIAGNOSIS — M48062 Spinal stenosis, lumbar region with neurogenic claudication: Secondary | ICD-10-CM | POA: Diagnosis not present

## 2018-05-09 DIAGNOSIS — M5416 Radiculopathy, lumbar region: Secondary | ICD-10-CM | POA: Diagnosis not present

## 2018-05-09 DIAGNOSIS — M5136 Other intervertebral disc degeneration, lumbar region: Secondary | ICD-10-CM | POA: Diagnosis not present

## 2018-05-10 ENCOUNTER — Ambulatory Visit
Admission: RE | Admit: 2018-05-10 | Discharge: 2018-05-10 | Disposition: A | Discharge status: Home / Self Care | Payer: PPO | Source: Ambulatory Visit | Attending: Nephrology | Admitting: Nephrology

## 2018-05-10 DIAGNOSIS — N183 Chronic kidney disease, stage 3 unspecified: Secondary | ICD-10-CM

## 2018-05-10 DIAGNOSIS — N189 Chronic kidney disease, unspecified: Secondary | ICD-10-CM | POA: Diagnosis not present

## 2018-06-01 DIAGNOSIS — I129 Hypertensive chronic kidney disease with stage 1 through stage 4 chronic kidney disease, or unspecified chronic kidney disease: Secondary | ICD-10-CM | POA: Diagnosis not present

## 2018-06-01 DIAGNOSIS — R809 Proteinuria, unspecified: Secondary | ICD-10-CM | POA: Diagnosis not present

## 2018-06-01 DIAGNOSIS — N183 Chronic kidney disease, stage 3 (moderate): Secondary | ICD-10-CM | POA: Diagnosis not present

## 2018-06-19 ENCOUNTER — Other Ambulatory Visit: Payer: Self-pay | Admitting: Family Medicine

## 2018-07-04 DIAGNOSIS — H40003 Preglaucoma, unspecified, bilateral: Secondary | ICD-10-CM | POA: Diagnosis not present

## 2018-07-22 ENCOUNTER — Other Ambulatory Visit: Payer: Self-pay | Admitting: *Deleted

## 2018-07-22 MED ORDER — ATORVASTATIN CALCIUM 40 MG PO TABS: ORAL_TABLET | ORAL | 3 refills | Status: DC

## 2018-08-24 DIAGNOSIS — M48062 Spinal stenosis, lumbar region with neurogenic claudication: Secondary | ICD-10-CM | POA: Diagnosis not present

## 2018-08-24 DIAGNOSIS — M5136 Other intervertebral disc degeneration, lumbar region: Secondary | ICD-10-CM | POA: Diagnosis not present

## 2018-08-24 DIAGNOSIS — M5416 Radiculopathy, lumbar region: Secondary | ICD-10-CM | POA: Diagnosis not present

## 2018-08-29 ENCOUNTER — Telehealth: Payer: Self-pay

## 2018-08-29 NOTE — Telephone Encounter (Signed)
Patient was scheduled for a 6 month f/u on 09/16/2018. She called and cancelled this appointment because she had something to do on that day. Patient wants to know if she needs to reschedule this visit for a later day, or month. Please call patient.

## 2018-08-31 NOTE — Telephone Encounter (Signed)
We can set her up for an evisit or phone visit instead on whatever day near that original date that works for her.  She can drive up for BP check or check it at home if she wants

## 2018-08-31 NOTE — Telephone Encounter (Signed)
Patient rescheduled for a telephone visit on 09/15/2018

## 2018-09-15 ENCOUNTER — Encounter: Payer: Self-pay | Admitting: Family Medicine

## 2018-09-15 ENCOUNTER — Ambulatory Visit (INDEPENDENT_AMBULATORY_CARE_PROVIDER_SITE_OTHER): Payer: PPO | Admitting: Family Medicine

## 2018-09-15 VITALS — BP 139/75 | HR 82

## 2018-09-15 DIAGNOSIS — E7800 Pure hypercholesterolemia, unspecified: Secondary | ICD-10-CM | POA: Diagnosis not present

## 2018-09-15 DIAGNOSIS — F4329 Adjustment disorder with other symptoms: Secondary | ICD-10-CM

## 2018-09-15 DIAGNOSIS — I10 Essential (primary) hypertension: Secondary | ICD-10-CM | POA: Diagnosis not present

## 2018-09-15 DIAGNOSIS — G309 Alzheimer's disease, unspecified: Secondary | ICD-10-CM

## 2018-09-15 DIAGNOSIS — F3342 Major depressive disorder, recurrent, in full remission: Secondary | ICD-10-CM

## 2018-09-15 DIAGNOSIS — F0150 Vascular dementia without behavioral disturbance: Secondary | ICD-10-CM | POA: Diagnosis not present

## 2018-09-15 DIAGNOSIS — F0280 Dementia in other diseases classified elsewhere without behavioral disturbance: Secondary | ICD-10-CM | POA: Diagnosis not present

## 2018-09-15 DIAGNOSIS — E0590 Thyrotoxicosis, unspecified without thyrotoxic crisis or storm: Secondary | ICD-10-CM

## 2018-09-15 MED ORDER — DONEPEZIL HCL 10 MG PO TABS: 10.0000 mg | ORAL_TABLET | Freq: Every day | ORAL | 3 refills | Status: DC

## 2018-09-15 MED ORDER — SPIRONOLACTONE 25 MG PO TABS: 25.0000 mg | ORAL_TABLET | Freq: Every day | ORAL | 3 refills | Status: DC

## 2018-09-15 MED ORDER — ATORVASTATIN CALCIUM 40 MG PO TABS: ORAL_TABLET | ORAL | 3 refills | Status: DC

## 2018-09-15 MED ORDER — MIRTAZAPINE 15 MG PO TABS: ORAL_TABLET | ORAL | 3 refills | Status: DC

## 2018-09-15 MED ORDER — AMLODIPINE BESYLATE 10 MG PO TABS: 5.0000 mg | ORAL_TABLET | Freq: Every day | ORAL | 3 refills | Status: DC

## 2018-09-15 MED ORDER — MELOXICAM 7.5 MG PO TABS: 7.5000 mg | ORAL_TABLET | Freq: Every day | ORAL | 1 refills | Status: DC

## 2018-09-15 MED ORDER — CITALOPRAM HYDROBROMIDE 20 MG PO TABS: 20.0000 mg | ORAL_TABLET | Freq: Every day | ORAL | 3 refills | Status: DC

## 2018-09-15 NOTE — Assessment & Plan Note (Signed)
Previously well controlled H/o CVA Continue statin Recheck FLP and CMP at next in person visit

## 2018-09-15 NOTE — Assessment & Plan Note (Signed)
Chronic, in remission Continue miurtazipine and celexa at current doses

## 2018-09-15 NOTE — Progress Notes (Signed)
Patient: Becky Prince Female    DOB: Dec 18, 1925   85 y.o.   MRN: [id number] Visit Date: 09/15/2018  Today's Provider: [name], MD   Chief Complaint  Patient presents with  . Hypertension   Subjective:    Virtual Visit via Telephone Note  I connected with Becky Prince on 09/15/18 at 11:20 AM EDT by telephone and verified that I am speaking with the correct person using two identifiers.   Patient location: home Provider location: [place] involved in the visit: patient, provider   I discussed the limitations, risks, security and privacy concerns of performing an evaluation and management service by telephone and the availability of in person appointments. I also discussed with the patient that there may be a patient responsible charge related to this service. The patient expressed understanding and agreed to proceed.   HPI  Hypertension, follow-up:  BP Readings from Last 3 Encounters:  09/15/18 139/75  03/17/18 (!) 108/58  12/17/17 138/66    She was last seen for hypertension 6 months ago.  BP at that visit was 108/58. Management changes since that visit include no change. She reports good compliance with treatment. She is not having side effects.  She is exercising lightly. She is adherent to low salt diet.   Outside blood pressures are being checked at home. She is experiencing none.  Patient denies chest pain, chest pressure/discomfort, claudication, dyspnea, exertional chest pressure/discomfort, fatigue, irregular heart beat, lower extremity edema, near-syncope, orthopnea, palpitations, paroxysmal nocturnal dyspnea, syncope and tachypnea.   Cardiovascular risk factors include advanced age (older than 12 for men, 5 for women), dyslipidemia and hypertension.  Use of agents associated with hypertension: none.     Weight trend: stable Wt Readings from Last 3 Encounters:  03/17/18 120 lb (54.4 kg)  12/17/17 119 lb (54 kg)   10/13/17 116 lb (52.6 kg)    Current diet: in general, a "healthy" diet    ------------------------------------------------------------------------  Allergies  Allergen Reactions  . Naproxen Swelling    Had lip swelling 20 yrs ago - not sure if caused by oxycodone or naproxen  . Oxycodone-Aspirin Swelling    Had lip swelling 20 yrs ago. Not sure if caused by oxycodone or naproxen.  . Prednisone Diarrhea     Current Outpatient Medications:  .  amLODipine (NORVASC) 10 MG tablet, Take 0.5 tablets (5 mg total) by mouth daily., Disp: 45 tablet, Rfl: 3 .  aspirin 120 MG suppository, TAKE 1 TABLET BY MOUTH EVERY DAY, Disp: , Rfl: 11 .  atorvastatin (LIPITOR) 40 MG tablet, TAKE 1 TABLET BY MOUTH DAILY AT 6 PM., Disp: 90 tablet, Rfl: 3 .  citalopram (CELEXA) 20 MG tablet, Take 1 tablet (20 mg total) by mouth daily., Disp: 90 tablet, Rfl: 3 .  donepezil (ARICEPT) 10 MG tablet, Take 1 tablet (10 mg total) by mouth at bedtime., Disp: 90 tablet, Rfl: 3 .  meloxicam (MOBIC) 7.5 MG tablet, Take 1 tablet (7.5 mg total) by mouth daily., Disp: 90 tablet, Rfl: 1 .  mirtazapine (REMERON) 15 MG tablet, TAKE 1 TABLET BY MOUTH EVERYDAY AT BEDTIME, Disp: 90 tablet, Rfl: 3 .  Multiple Vitamins-Minerals (MULTIVITAMIN) tablet, Take 1 tablet by mouth daily., Disp: 30 tablet, Rfl: 0 .  Omega-3 Fatty Acids (FISH OIL) 1200 MG CAPS, Take 1 capsule by mouth every other day. , Disp: 60 capsule, Rfl: 0 .  spironolactone (ALDACTONE) 25 MG tablet, Take 1 tablet (25 mg total) by mouth  daily., Disp: 90 tablet, Rfl: 3  Review of Systems  Constitutional: Negative.   Respiratory: Negative.   Cardiovascular: Negative.   Musculoskeletal: Negative.     Social History   Tobacco Use  . Smoking status: Never Smoker  . Smokeless tobacco: Never Used  Substance Use Topics  . Alcohol use: No    Alcohol/week: 0.0 standard drinks      Objective:   BP 139/75   Pulse 82  Vitals:   09/15/18 1119  BP: 139/75  Pulse: 82      Physical Exam  Depression screen [facility] 2/9 09/15/2018 03/17/2018 10/13/2017 05/24/2017 03/10/2017  Decreased Interest 0 0 3 1 1   Down, Depressed, Hopeless 0 0 1 2 0  PHQ - 2 Score 0 0 4 3 1   Altered sleeping 0 0 0 3 -  Tired, decreased energy 0 0 1 2 -  Change in appetite 0 0 0 3 -  Feeling bad or failure about yourself  0 0 0 2 -  Trouble concentrating 0 0 0 0 -  Moving slowly or fidgety/restless 0 0 0 0 -  Suicidal thoughts 0 0 0 0 -  PHQ-9 Score 0 0 5 13 -  Difficult doing work/chores Not difficult at all Not difficult at all Not difficult at all Extremely dIfficult -       Assessment & Plan    I discussed the assessment and treatment plan with the patient. The patient was provided an opportunity to ask questions and all were answered. The patient agreed with the plan and demonstrated an understanding of the instructions.   The patient was advised to call back or seek an in-person evaluation if the symptoms worsen or if the condition fails to improve as anticipated.  Problem List Items Addressed This Visit      Cardiovascular and Mediastinum   BP (high blood pressure) - Primary    Well controlled on home readings Will recheck metabolic panel at next in person visit Continue current meds - reviewed with patient      Relevant Medications   amLODipine (NORVASC) 10 MG tablet   spironolactone (ALDACTONE) 25 MG tablet   atorvastatin (LIPITOR) 40 MG tablet   Mixed dementia (HCC)    Chronic and stable Will increase Aricept ot 10mg  daily Discussed possible side effects Previously followed by neurology, but no longer      Relevant Medications   citalopram (CELEXA) 20 MG tablet   amLODipine (NORVASC) 10 MG tablet   spironolactone (ALDACTONE) 25 MG tablet   atorvastatin (LIPITOR) 40 MG tablet   mirtazapine (REMERON) 15 MG tablet   donepezil (ARICEPT) 10 MG tablet     Endocrine   Subclinical hyperthyroidism    Not currently on medications Recheck TSH and free T4 at next  in person visit        Other   MDD (major depressive disorder)    Chronic, in remission Continue miurtazipine and celexa at current doses      Relevant Medications   citalopram (CELEXA) 20 MG tablet   mirtazapine (REMERON) 15 MG tablet   Hypercholesteremia    Previously well controlled H/o CVA Continue statin Recheck FLP and CMP at next in person visit      Relevant Medications   amLODipine (NORVASC) 10 MG tablet   spironolactone (ALDACTONE) 25 MG tablet   atorvastatin (LIPITOR) 40 MG tablet    Other Visit Diagnoses    Adjustment disorder with other symptom       Relevant Medications  citalopram (CELEXA) 20 MG tablet       Return in about 6 months (around 03/18/2019) for AWV/CPE.   The entirety of the information documented in the History of Present Illness, Review of Systems and Physical Exam were personally obtained by me. Portions of this information were initially documented by [name], CMA and reviewed by me for thoroughness and accuracy.    , [name], MD MPH [facility]

## 2018-09-15 NOTE — Assessment & Plan Note (Signed)
Well controlled on home readings Will recheck metabolic panel at next in person visit Continue current meds - reviewed with patient

## 2018-09-15 NOTE — Assessment & Plan Note (Signed)
Chronic and stable Will increase Aricept ot 10mg  daily Discussed possible side effects Previously followed by neurology, but no longer

## 2018-09-15 NOTE — Assessment & Plan Note (Signed)
Not currently on medications Recheck TSH and free T4 at next in person visit

## 2018-09-16 ENCOUNTER — Ambulatory Visit: Payer: Self-pay | Admitting: Family Medicine

## 2018-11-09 DIAGNOSIS — M48062 Spinal stenosis, lumbar region with neurogenic claudication: Secondary | ICD-10-CM | POA: Diagnosis not present

## 2018-11-09 DIAGNOSIS — M5136 Other intervertebral disc degeneration, lumbar region: Secondary | ICD-10-CM | POA: Diagnosis not present

## 2018-11-09 DIAGNOSIS — M5416 Radiculopathy, lumbar region: Secondary | ICD-10-CM | POA: Diagnosis not present

## 2018-11-11 DIAGNOSIS — H40003 Preglaucoma, unspecified, bilateral: Secondary | ICD-10-CM | POA: Diagnosis not present

## 2018-11-23 ENCOUNTER — Other Ambulatory Visit: Payer: Self-pay | Admitting: Family Medicine

## 2018-11-23 DIAGNOSIS — J301 Allergic rhinitis due to pollen: Secondary | ICD-10-CM

## 2018-12-06 ENCOUNTER — Other Ambulatory Visit: Payer: Self-pay | Admitting: Family Medicine

## 2018-12-12 DIAGNOSIS — R809 Proteinuria, unspecified: Secondary | ICD-10-CM | POA: Diagnosis not present

## 2018-12-12 DIAGNOSIS — N183 Chronic kidney disease, stage 3 (moderate): Secondary | ICD-10-CM | POA: Diagnosis not present

## 2018-12-12 DIAGNOSIS — I129 Hypertensive chronic kidney disease with stage 1 through stage 4 chronic kidney disease, or unspecified chronic kidney disease: Secondary | ICD-10-CM | POA: Diagnosis not present

## 2018-12-12 LAB — BASIC METABOLIC PANEL
BUN: 33 — AB (ref 4–21)
Creatinine: 1.6 — AB (ref <=1.1)
Glucose: 105
Potassium: 5 (ref 3.4–5.3)
Sodium: 137 (ref 137–147)

## 2018-12-12 LAB — CBC AND DIFFERENTIAL
HCT: 42 (ref 36–46)
Hemoglobin: 14 (ref 12.0–16.0)
Platelets: 244 (ref 150–399)
WBC: 5.8

## 2019-01-03 ENCOUNTER — Ambulatory Visit: Payer: Self-pay

## 2019-01-04 DIAGNOSIS — M5416 Radiculopathy, lumbar region: Secondary | ICD-10-CM | POA: Diagnosis not present

## 2019-01-04 DIAGNOSIS — M48062 Spinal stenosis, lumbar region with neurogenic claudication: Secondary | ICD-10-CM | POA: Diagnosis not present

## 2019-01-04 DIAGNOSIS — M5136 Other intervertebral disc degeneration, lumbar region: Secondary | ICD-10-CM | POA: Diagnosis not present

## 2019-02-13 ENCOUNTER — Ambulatory Visit (INDEPENDENT_AMBULATORY_CARE_PROVIDER_SITE_OTHER): Payer: PPO | Admitting: Family Medicine

## 2019-02-13 ENCOUNTER — Encounter: Payer: Self-pay | Admitting: Family Medicine

## 2019-02-13 ENCOUNTER — Other Ambulatory Visit: Payer: Self-pay

## 2019-02-13 VITALS — BP 144/70 | Wt 122.0 lb

## 2019-02-13 DIAGNOSIS — R61 Generalized hyperhidrosis: Secondary | ICD-10-CM | POA: Insufficient documentation

## 2019-02-13 DIAGNOSIS — F4320 Adjustment disorder, unspecified: Secondary | ICD-10-CM | POA: Insufficient documentation

## 2019-02-13 DIAGNOSIS — F4329 Adjustment disorder with other symptoms: Secondary | ICD-10-CM

## 2019-02-13 MED ORDER — CITALOPRAM HYDROBROMIDE 10 MG PO TABS: 10.0000 mg | ORAL_TABLET | Freq: Every day | ORAL | 1 refills | Status: DC

## 2019-02-13 NOTE — Assessment & Plan Note (Addendum)
Longstanding problem with recent worsening She is not soaking through her clothes and bed sheets, but she is waking up damp each morning She denies any fevers or infectious symptoms Discussed with patient that this is a wide differential and could be caused by many different things Patient would like to hold off on lab work at this time, but if still occurring at next visit, would obtain labs, including CBC, CMP, CRP, TSH, HIV Could also consider TB testing, UA, blood cultures Patient has been taking 2 SSRIs for many years Her depression symptoms are well controlled We will decrease Celexa to 10 mg daily and plan to slowly titrate off We will continue mirtazapine as this helps with her appetite Follow-up in 1 month -ideally this would be an in person visit so that we could do exam and check for any lymphadenopathy, but this is difficult given her age and the current setting of the pandemic

## 2019-02-13 NOTE — Assessment & Plan Note (Signed)
Well-controlled As above, she is taking 2 SSRIs We will decrease Celexa to 10 mg daily and plan to slowly titrate off Continue mirtazapine Follow-up in 1 month

## 2019-02-13 NOTE — Progress Notes (Signed)
Patient: Becky Prince Female    DOB: [birth year]   85 y.o.   MRN: [id number] Visit Date: 02/13/2019  Today's Provider: [name], MD   Chief Complaint  Patient presents with  . Night Sweats    Has been going on for several years but seems to be worsening.    Subjective:    Virtual Visit via Telephone Note  I connected with Becky Prince on 02/13/19 at 11:20 AM EDT by telephone and verified that I am speaking with the correct person using two identifiers.   Patient location: home Provider location: [place] involved in the visit: patient, provider   I discussed the limitations, risks, security and privacy concerns of performing an evaluation and management service by telephone and the availability of in person appointments. I also discussed with the patient that there may be a patient responsible charge related to this service. The patient expressed understanding and agreed to proceed.  HPI  Pajamas are damp when she wakes in the morning.  Cannot tell if sheets are wet at all.  Has been ongoing nightly for several years, but feels to be more in quantity now.  States she does not note any lymph nodes in axilla or on neck. No fevers, unintentional weight loss.  Taking Citalopram and mirtazapine for many years.  Unsure if this correlates with night sweats.  Depression screen [facility] 2/9 02/13/2019 09/15/2018 03/17/2018 10/13/2017 05/24/2017  Decreased Interest 0 0 0 3 1  Down, Depressed, Hopeless 0 0 0 1 2  PHQ - 2 Score 0 0 0 4 3  Altered sleeping 1 0 0 0 3  Tired, decreased energy 0 0 0 1 2  Change in appetite 0 0 0 0 3  Feeling bad or failure about yourself  0 0 0 0 2  Trouble concentrating 0 0 0 0 0  Moving slowly or fidgety/restless 0 0 0 0 0  Suicidal thoughts 0 0 0 0 0  PHQ-9 Score 1 0 0 5 13  Difficult doing work/chores Not difficult at all Not difficult at all Not difficult at all Not difficult at all Extremely dIfficult      Allergies  Allergen Reactions  . Naproxen Swelling    Had lip swelling 20 yrs ago - not sure if caused by oxycodone or naproxen  . Oxycodone-Aspirin Swelling    Had lip swelling 20 yrs ago. Not sure if caused by oxycodone or naproxen.  . Prednisone Diarrhea     Current Outpatient Medications:  .  amLODipine (NORVASC) 10 MG tablet, TAKE 1 TABLET BY MOUTH EVERY DAY, Disp: 90 tablet, Rfl: 3 .  ASPIRIN 81 PO, TAKE 1 TABLET BY MOUTH EVERY DAY, Disp: , Rfl:  .  atorvastatin (LIPITOR) 40 MG tablet, TAKE 1 TABLET BY MOUTH DAILY AT 6 PM., Disp: 90 tablet, Rfl: 3 .  citalopram (CELEXA) 10 MG tablet, Take 1 tablet (10 mg total) by mouth daily., Disp: 30 tablet, Rfl: 1 .  donepezil (ARICEPT) 10 MG tablet, Take 1 tablet (10 mg total) by mouth at bedtime., Disp: 90 tablet, Rfl: 3 .  meloxicam (MOBIC) 7.5 MG tablet, Take 1 tablet (7.5 mg total) by mouth daily., Disp: 90 tablet, Rfl: 1 .  mirtazapine (REMERON) 15 MG tablet, TAKE 1 TABLET BY MOUTH EVERYDAY AT BEDTIME, Disp: 90 tablet, Rfl: 3 .  Multiple Vitamin (MULTI-VITAMIN) tablet, Take by mouth., Disp: , Rfl:  .  spironolactone (ALDACTONE) 25 MG tablet, Take 1 tablet (  25 mg total) by mouth daily., Disp: 90 tablet, Rfl: 3 .  traMADol (ULTRAM) 50 MG tablet, TAKE 1 TABLET BY MOUTH FOUR TIMES A DAY AS NEEDED, Disp: , Rfl:   Review of Systems  Constitutional: Positive for diaphoresis. Negative for activity change, appetite change, chills, fatigue, fever and unexpected weight change.  HENT: Negative.   Respiratory: Negative.   Gastrointestinal: Negative.     Social History   Tobacco Use  . Smoking status: Never Smoker  . Smokeless tobacco: Never Used  Substance Use Topics  . Alcohol use: No    Alcohol/week: 0.0 standard drinks      Objective:   BP (!) 144/70   Wt 122 lb (55.3 kg)   BMI 18.55 kg/m  Vitals:   02/13/19 0839  BP: (!) 144/70  Weight: 122 lb (55.3 kg)  Body mass index is 18.55 kg/m.   Physical Exam Speaks in full  sentences.  Answers questions appropriately.  No evidence of respiratory distress   No results found for any visits on 02/13/19.     Assessment & Plan    Follow Up Instructions: I discussed the assessment and treatment plan with the patient. The patient was provided an opportunity to ask questions and all were answered. The patient agreed with the plan and demonstrated an understanding of the instructions.   The patient was advised to call back or seek an in-person evaluation if the symptoms worsen or if the condition fails to improve as anticipated.  Problem List Items Addressed This Visit      Other   Adjustment disorder    Well-controlled As above, she is taking 2 SSRIs We will decrease Celexa to 10 mg daily and plan to slowly titrate off Continue mirtazapine Follow-up in 1 month      Relevant Medications   citalopram (CELEXA) 10 MG tablet   Night sweats - Primary    Longstanding problem with recent worsening She is not soaking through her clothes and bed sheets, but she is waking up damp each morning She denies any fevers or infectious symptoms Discussed with patient that this is a wide differential and could be caused by many different things Patient would like to hold off on lab work at this time, but if still occurring at next visit, would obtain labs, including CBC, CMP, CRP, TSH, HIV Could also consider TB testing, UA, blood cultures Patient has been taking 2 SSRIs for many years Her depression symptoms are well controlled We will decrease Celexa to 10 mg daily and plan to slowly titrate off We will continue mirtazapine as this helps with her appetite Follow-up in 1 month -ideally this would be an in person visit so that we could do exam and check for any lymphadenopathy, but this is difficult given her age and the current setting of the pandemic          Return in about 4 weeks (around 03/13/2019) for Night sweats follow-up.   The entirety of the information  documented in the History of Present Illness, Review of Systems and Physical Exam were personally obtained by me. Portions of this information were initially documented by Ashley Royalty, CMA and reviewed by me for thoroughness and accuracy.    , [name], MD MPH [facility]

## 2019-02-17 ENCOUNTER — Other Ambulatory Visit: Payer: Self-pay | Admitting: Family Medicine

## 2019-02-22 ENCOUNTER — Telehealth: Payer: Self-pay | Admitting: Family Medicine

## 2019-02-22 MED ORDER — AMLODIPINE BESYLATE 5 MG PO TABS: 5.0000 mg | ORAL_TABLET | Freq: Every day | ORAL | 3 refills | Status: DC

## 2019-02-22 NOTE — Telephone Encounter (Signed)
Pt wanting to know if she can have the amLODipine (NORVASC) 10 MG tablet in a 5 MG tablet. She doesn't want to keep cutting the 10 MG in half.  Please call pt back to let her know.  Thanks, [employer]

## 2019-02-22 NOTE — Telephone Encounter (Signed)
OK to change Rx for 90 day supply of 5mg  pills

## 2019-03-07 ENCOUNTER — Other Ambulatory Visit: Payer: Self-pay | Admitting: Family Medicine

## 2019-03-07 DIAGNOSIS — F4329 Adjustment disorder with other symptoms: Secondary | ICD-10-CM

## 2019-03-07 NOTE — Telephone Encounter (Signed)
Please review.  Pt was slowly coming off citalopram slowly since 02/13/2019   Thanks,   -Mickel Baas

## 2019-03-15 DIAGNOSIS — M48062 Spinal stenosis, lumbar region with neurogenic claudication: Secondary | ICD-10-CM | POA: Diagnosis not present

## 2019-03-15 DIAGNOSIS — M5416 Radiculopathy, lumbar region: Secondary | ICD-10-CM | POA: Diagnosis not present

## 2019-03-15 DIAGNOSIS — M5136 Other intervertebral disc degeneration, lumbar region: Secondary | ICD-10-CM | POA: Diagnosis not present

## 2019-03-17 ENCOUNTER — Other Ambulatory Visit: Payer: Self-pay | Admitting: Physician Assistant

## 2019-03-17 ENCOUNTER — Ambulatory Visit (INDEPENDENT_AMBULATORY_CARE_PROVIDER_SITE_OTHER): Payer: PPO | Admitting: Family Medicine

## 2019-03-17 DIAGNOSIS — F4329 Adjustment disorder with other symptoms: Secondary | ICD-10-CM | POA: Diagnosis not present

## 2019-03-17 DIAGNOSIS — R61 Generalized hyperhidrosis: Secondary | ICD-10-CM | POA: Diagnosis not present

## 2019-03-17 NOTE — Progress Notes (Signed)
Patient: Becky Prince Female    DOB: 1926/01/27   85 y.o.   MRN: [id number] Visit Date: 03/17/2019  Today's Provider: [name], MD   Chief Complaint  Patient presents with  . Follow-up   Subjective:    Virtual Visit via Telephone Note  I connected with Becky Prince on 03/17/19 at  8:40 AM EST by telephone and verified that I am speaking with the correct person using two identifiers.   Patient location: home Provider location: [place] involved in the visit: patient, provider   I discussed the limitations, risks, security and privacy concerns of performing an evaluation and management service by telephone and the availability of in person appointments. I also discussed with the patient that there may be a patient responsible charge related to this service. The patient expressed understanding and agreed to proceed.   HPI  Adjustment disorder, Follow-up  She  was last seen for this 1 months ago. Changes made at last visit include decrease Celexa to 10 mg daily and plan to slowly titrate off. Patient advised to continue Mirtazapine.   She unsure of how much celexa she is taking or what time she takes medication. She is not having side effects.  She reports excellent tolerance of treatment. Current symptoms include: patient reports no symptoms She feels she is Unchanged since last visit.  ------------------------------------------------------------------------   Follow up for night sweats  The patient was last seen for this 1 months ago. Changes made at last visit include no changes.   She feels that condition is worsening further.  Denies any weight loss. ------------------------------------------------------------------------------------    Allergies  Allergen Reactions  . Naproxen Swelling    Had lip swelling 20 yrs ago - not sure if caused by oxycodone or naproxen  . Oxycodone-Aspirin Swelling    Had lip swelling 20  yrs ago. Not sure if caused by oxycodone or naproxen.  . Prednisone Diarrhea     Current Outpatient Medications:  .  amLODipine (NORVASC) 5 MG tablet, Take 1 tablet (5 mg total) by mouth daily., Disp: 90 tablet, Rfl: 3 .  ASPIRIN 81 PO, TAKE 1 TABLET BY MOUTH EVERY DAY, Disp: , Rfl:  .  atorvastatin (LIPITOR) 40 MG tablet, TAKE 1 TABLET BY MOUTH DAILY AT 6 PM., Disp: 90 tablet, Rfl: 3 .  donepezil (ARICEPT) 10 MG tablet, Take 1 tablet (10 mg total) by mouth at bedtime., Disp: 90 tablet, Rfl: 3 .  fluticasone (FLONASE) 50 MCG/ACT nasal spray, Place 1 spray into both nostrils daily., Disp: , Rfl:  .  meloxicam (MOBIC) 7.5 MG tablet, TAKE 1 TABLET BY MOUTH EVERY DAY, Disp: 90 tablet, Rfl: 0 .  mirtazapine (REMERON) 15 MG tablet, TAKE 1 TABLET BY MOUTH EVERYDAY AT BEDTIME, Disp: 90 tablet, Rfl: 3 .  Multiple Vitamin (MULTI-VITAMIN) tablet, Take by mouth., Disp: , Rfl:  .  spironolactone (ALDACTONE) 25 MG tablet, Take 1 tablet (25 mg total) by mouth daily., Disp: 90 tablet, Rfl: 3 .  traMADol (ULTRAM) 50 MG tablet, TAKE 1 TABLET BY MOUTH FOUR TIMES A DAY AS NEEDED, Disp: , Rfl:  .  citalopram (CELEXA) 10 MG tablet, TAKE 1 TABLET BY MOUTH EVERY DAY, Disp: 90 tablet, Rfl: 1  Review of Systems  Constitutional: Positive for diaphoresis.  Respiratory: Negative.   Cardiovascular: Negative.   Psychiatric/Behavioral: Negative.     Social History   Tobacco Use  . Smoking status: Never Smoker  . Smokeless tobacco: Never Used  Substance Use Topics  . Alcohol use: No    Alcohol/week: 0.0 standard drinks      Objective:   There were no vitals taken for this visit. There were no vitals filed for this visit.There is no height or weight on file to calculate BMI.   Physical Exam Speaking in full sentences with NAD  No results found for any visits on 03/17/19.     Assessment & Plan  I discussed the assessment and treatment plan with the patient. The patient was provided an opportunity to ask  questions and all were answered. The patient agreed with the plan and demonstrated an understanding of the instructions.   The patient was advised to call back or seek an in-person evaluation if the symptoms worsen or if the condition fails to improve as anticipated.   Problem List Items Addressed This Visit      Other   Adjustment disorder    Chronic and well-controlled Continue Celexa and mirtazapine at current doses      Night sweats - Primary    Longstanding problem that continues to worsen She is now soaking through her clothes at night She denies any fevers or infectious symptoms We discussed that ideally we would obtain lab work, including CBC, CMP, CRP, TSH, HIV, UA, but patient reports she is self isolating at home and does not wish to come out to the office at this time Could also consider TB testing and blood cultures We decreased her Celexa at last visit with no improvement in her symptoms Ideally, we could have an in person visit so we could do an exam and check for lymphadenopathy or other features, but given the current pandemic, patient is self isolating at home Will refer to heme-onc for further evaluation      Relevant Orders   Ambulatory referral to Hematology        The entirety of the information documented in the History of Present Illness, Review of Systems and Physical Exam were personally obtained by me. Portions of this information were initially documented by Lynford Humphrey, CMA and reviewed by me for thoroughness and accuracy.    Rebecca Cairns, [name], MD MPH [facility]

## 2019-03-17 NOTE — Assessment & Plan Note (Signed)
Chronic and well-controlled Continue Celexa and mirtazapine at current doses

## 2019-03-17 NOTE — Assessment & Plan Note (Signed)
Longstanding problem that continues to worsen She is now soaking through her clothes at night She denies any fevers or infectious symptoms We discussed that ideally we would obtain lab work, including CBC, CMP, CRP, TSH, HIV, UA, but patient reports she is self isolating at home and does not wish to come out to the office at this time Could also consider TB testing and blood cultures We decreased her Celexa at last visit with no improvement in her symptoms Ideally, we could have an in person visit so we could do an exam and check for lymphadenopathy or other features, but given the current pandemic, patient is self isolating at home Will refer to heme-onc for further evaluation

## 2019-03-26 ENCOUNTER — Other Ambulatory Visit: Payer: Self-pay | Admitting: Physician Assistant

## 2019-03-27 ENCOUNTER — Other Ambulatory Visit: Payer: Self-pay | Admitting: Physician Assistant

## 2019-03-27 NOTE — Telephone Encounter (Signed)
Requested medication (s) are due for refill today: no  Requested medication (s) are on the active medication list: yes  Last refill:  02/27/2019  Future visit scheduled: yes  Notes to clinic:  Review for refill   Requested Prescriptions  Pending Prescriptions Disp Refills   meloxicam (MOBIC) 7.5 MG tablet [Pharmacy Med Name: MELOXICAM 7.5 MG TABLET] 90 tablet 0    Sig: TAKE 1 TABLET BY MOUTH EVERY DAY     Analgesics:  COX2 Inhibitors Failed - 03/26/2019  2:19 PM      Failed - Cr in normal range and within 360 days    Creatinine  Date Value Ref Range Status  12/12/2018 1.6 (A) 0.5 - 1.1 Final   Creat  Date Value Ref Range Status  03/11/2017 1.42 (H) 0.60 - 0.88 mg/dL Final    Comment:    For patients >86 years of age, the reference limit for Creatinine is approximately 13% higher for people identified as African-American. .    Creatinine, Ser  Date Value Ref Range Status  03/17/2018 1.63 (H) 0.57 - 1.00 mg/dL Final         Passed - HGB in normal range and within 360 days    Hemoglobin  Date Value Ref Range Status  12/12/2018 14.0 12.0 - 16.0 Final  03/17/2018 12.3 11.1 - 15.9 g/dL Final         Passed - Patient is not pregnant      Passed - Valid encounter within last 12 months    Recent Outpatient Visits          1 week ago Night sweats   [facility] [place], [name], MD   1 month ago Night sweats   [facility] [place], [name], MD   6 months ago Essential hypertension   [place], [name], MD   1 year ago Medicare annual wellness visit, subsequent   [place], [name], MD   1 year ago Bilateral lower extremity edema   [facility] [place], [name], MD      Future Appointments            In 2 days  [facility], [place]   In 2 days [place], [name], MD [facility], [place]

## 2019-03-29 ENCOUNTER — Encounter: Payer: PPO | Admitting: Family Medicine

## 2019-03-29 ENCOUNTER — Ambulatory Visit: Payer: PPO

## 2019-03-31 ENCOUNTER — Other Ambulatory Visit: Payer: Self-pay

## 2019-03-31 ENCOUNTER — Inpatient Hospital Stay: Payer: PPO

## 2019-03-31 ENCOUNTER — Inpatient Hospital Stay: Payer: PPO | Attending: Oncology | Admitting: Oncology

## 2019-03-31 ENCOUNTER — Encounter: Payer: Self-pay | Admitting: Oncology

## 2019-03-31 DIAGNOSIS — N189 Chronic kidney disease, unspecified: Secondary | ICD-10-CM

## 2019-03-31 DIAGNOSIS — R61 Generalized hyperhidrosis: Secondary | ICD-10-CM | POA: Diagnosis not present

## 2019-03-31 DIAGNOSIS — I129 Hypertensive chronic kidney disease with stage 1 through stage 4 chronic kidney disease, or unspecified chronic kidney disease: Secondary | ICD-10-CM | POA: Insufficient documentation

## 2019-03-31 LAB — COMPREHENSIVE METABOLIC PANEL
ALT: 18 U/L (ref 0–44)
AST: 24 U/L (ref 15–41)
Albumin: 4.4 g/dL (ref 3.5–5.0)
Alkaline Phosphatase: 109 U/L (ref 38–126)
Anion gap: 9 (ref 5–15)
BUN: 38 mg/dL — ABNORMAL HIGH (ref 8–23)
CO2: 26 mmol/L (ref 22–32)
Calcium: 9.3 mg/dL (ref 8.9–10.3)
Chloride: 102 mmol/L (ref 98–111)
Creatinine, Ser: 1.71 mg/dL — ABNORMAL HIGH (ref 0.44–1.00)
GFR calc Af Amer: 30 mL/min — ABNORMAL LOW (ref >60)
GFR calc non Af Amer: 26 mL/min — ABNORMAL LOW (ref >60)
Glucose, Bld: 116 mg/dL — ABNORMAL HIGH (ref 70–99)
Potassium: 4.8 mmol/L (ref 3.5–5.1)
Sodium: 137 mmol/L (ref 135–145)
Total Bilirubin: 0.7 mg/dL (ref 0.3–1.2)
Total Protein: 7.6 g/dL (ref 6.5–8.1)

## 2019-03-31 LAB — CBC WITH DIFFERENTIAL/PLATELET
Abs Immature Granulocytes: 0.02 10*3/uL (ref 0.00–0.07)
Basophils Absolute: 0 10*3/uL (ref 0.0–0.1)
Basophils Relative: 0 %
Eosinophils Absolute: 0.1 10*3/uL (ref 0.0–0.5)
Eosinophils Relative: 1 %
HCT: 43.2 % (ref 36.0–46.0)
Hemoglobin: 13.9 g/dL (ref 12.0–15.0)
Immature Granulocytes: 0 %
Lymphocytes Relative: 22 %
Lymphs Abs: 1.5 10*3/uL (ref 0.7–4.0)
MCH: 31.8 pg (ref 26.0–34.0)
MCHC: 32.2 g/dL (ref 30.0–36.0)
MCV: 98.9 fL (ref 80.0–100.0)
Monocytes Absolute: 0.4 10*3/uL (ref 0.1–1.0)
Monocytes Relative: 5 %
Neutro Abs: 4.9 10*3/uL (ref 1.7–7.7)
Neutrophils Relative %: 72 %
Platelets: 243 10*3/uL (ref 150–400)
RBC: 4.37 MIL/uL (ref 3.87–5.11)
RDW: 12.2 % (ref 11.5–15.5)
WBC: 6.9 10*3/uL (ref 4.0–10.5)
nRBC: 0 % (ref 0.0–0.2)

## 2019-03-31 LAB — HIV ANTIBODY (ROUTINE TESTING W REFLEX): HIV Screen 4th Generation wRfx: NONREACTIVE

## 2019-03-31 LAB — SEDIMENTATION RATE: Sed Rate: 18 mm/hr (ref 0–30)

## 2019-03-31 LAB — TECHNOLOGIST SMEAR REVIEW: Plt Morphology: ADEQUATE

## 2019-03-31 LAB — LACTATE DEHYDROGENASE: LDH: 126 U/L (ref 98–192)

## 2019-03-31 LAB — C-REACTIVE PROTEIN: CRP: 0.9 mg/dL (ref <1.0)

## 2019-03-31 NOTE — Progress Notes (Signed)
Patient having night sweats about 3 nights a week (not every week) with nightmares.  Also having new headaches

## 2019-04-01 DIAGNOSIS — R61 Generalized hyperhidrosis: Secondary | ICD-10-CM | POA: Diagnosis not present

## 2019-04-01 LAB — THYROID PANEL WITH TSH
Free Thyroxine Index: 2.2 (ref 1.2–4.9)
T3 Uptake Ratio: 29 % (ref 24–39)
T4, Total: 7.6 ug/dL (ref 4.5–12.0)
TSH: 0.774 u[IU]/mL (ref 0.450–4.500)

## 2019-04-01 NOTE — Progress Notes (Signed)
HEMATOLOGY-ONCOLOGY TeleHEALTH VISIT INITIAL CONSULTATION  I connected with Becky Prince on 04/01/19 at  9:30 AM EST by video enabled telemedicine visit and verified that I am speaking with the correct person using two identifiers. I discussed the limitations, risks, security and privacy concerns of performing an evaluation and management service by telemedicine and the availability of in-person appointments. I also discussed with the patient that there may be a patient responsible charge related to this service. The patient expressed understanding and agreed to proceed.   Other persons participating in the visit and their role in the encounter:  Daughter to set up virtual visit for patient.,  Daughter also provides some history  Patient's location: Home  Provider's location: office  Referring provider: Brita Romp [name], MD  Chief Complaint: Night sweats  HISTORY OF PRESENT ILLNESS Becky Prince is a 85 y.o. female who was seen in consultation at the request of Virginia Crews, MD for evaluation of night sweats Patient reports having night sweats about 3 nights per week, most of the time was associated with nightmares.  Recently having headaches in the morning. Denies any unintentional weight loss, fever or chills.  Appetite is good. She is quite independent.  She still drives. She denies any pain, increasing abdominal girth, lower extremity swelling.  Review of Systems  Constitutional: Negative for appetite change, chills, fatigue and fever.       Night sweating  HENT:   Negative for hearing loss and voice change.   Eyes: Negative for eye problems.  Respiratory: Negative for chest tightness and cough.   Cardiovascular: Negative for chest pain.  Gastrointestinal: Negative for abdominal distention, abdominal pain and blood in stool.  Endocrine: Negative for hot flashes.  Genitourinary: Negative for difficulty urinating and frequency.   Musculoskeletal: Negative for  arthralgias.  Skin: Negative for itching and rash.  Neurological: Negative for extremity weakness.  Hematological: Negative for adenopathy.  Psychiatric/Behavioral: Negative for confusion.    Past Medical History:  Diagnosis Date  . Depression   . Environmental allergies   . Hypercholesteremia   . Hypertension    Past Surgical History:  Procedure Laterality Date  . ABDOMINAL HYSTERECTOMY    . CATARACT EXTRACTION W/PHACO Right 06/12/2015   Procedure: CATARACT EXTRACTION PHACO AND INTRAOCULAR LENS PLACEMENT (IOC);  Surgeon: Leandrew Koyanagi, MD;  Location: [facility];  Service: Ophthalmology;  Laterality: Right;    Family History  Problem Relation Age of Onset  . Heart disease Mother        CHF  . Hypertension Mother   . Cancer Father        prostate  . Cancer Sister        breast  . Kidney disease Brother   . Breast cancer Neg Hx     Social History   Socioeconomic History  . Marital status: Widowed    Spouse name: Not on file  . Number of children: Not on file  . Years of education: Not on file  . Highest education level: Not on file  Occupational History  . Not on file  Social Needs  . Financial resource strain: Not on file  . Food insecurity    Worry: Not on file    Inability: Not on file  . Transportation needs    Medical: Not on file    Non-medical: Not on file  Tobacco Use  . Smoking status: Never Smoker  . Smokeless tobacco: Never Used  Substance and Sexual Activity  . Alcohol use: No  Alcohol/week: 0.0 standard drinks  . Drug use: No  . Sexual activity: Not on file  Lifestyle  . Physical activity    Days per week: Not on file    Minutes per session: Not on file  . Stress: Not on file  Relationships  . Social Herbalist on phone: Not on file    Gets together: Not on file    Attends religious service: Not on file    Active member of club or organization: Not on file    Attends meetings of clubs or organizations: Not on  file    Relationship status: Not on file  . Intimate partner violence    Fear of current or ex partner: Not on file    Emotionally abused: Not on file    Physically abused: Not on file    Forced sexual activity: Not on file  Other Topics Concern  . Not on file  Social History Narrative  . Not on file    Current Outpatient Medications on File Prior to Visit  Medication Sig Dispense Refill  . amLODipine (NORVASC) 5 MG tablet Take 1 tablet (5 mg total) by mouth daily. 90 tablet 3  . ASPIRIN 81 PO TAKE 1 TABLET BY MOUTH EVERY DAY    . atorvastatin (LIPITOR) 40 MG tablet TAKE 1 TABLET BY MOUTH DAILY AT 6 PM. 90 tablet 3  . citalopram (CELEXA) 10 MG tablet TAKE 1 TABLET BY MOUTH EVERY DAY 90 tablet 1  . donepezil (ARICEPT) 10 MG tablet Take 1 tablet (10 mg total) by mouth at bedtime. 90 tablet 3  . fluticasone (FLONASE) 50 MCG/ACT nasal spray Place 1 spray into both nostrils daily.    . meloxicam (MOBIC) 7.5 MG tablet TAKE 1 TABLET BY MOUTH EVERY DAY 90 tablet 0  . mirtazapine (REMERON) 15 MG tablet TAKE 1 TABLET BY MOUTH EVERYDAY AT BEDTIME 90 tablet 3  . Multiple Vitamin (MULTI-VITAMIN) tablet Take by mouth.    . spironolactone (ALDACTONE) 25 MG tablet Take 1 tablet (25 mg total) by mouth daily. 90 tablet 3  . traMADol (ULTRAM) 50 MG tablet TAKE 1 TABLET BY MOUTH FOUR TIMES A DAY AS NEEDED     No current facility-administered medications on file prior to visit.     Allergies  Allergen Reactions  . Naproxen Swelling    Had lip swelling 20 yrs ago - not sure if caused by oxycodone or naproxen  . Oxycodone-Aspirin Swelling    Had lip swelling 20 yrs ago. Not sure if caused by oxycodone or naproxen.  . Prednisone Diarrhea       Observations/Objective: Today's Vitals   03/31/19 1014  PainSc: 0-No pain   There is no height or weight on file to calculate BMI.  Physical Exam  Constitutional: She is oriented to person, place, and time. No distress.  Neurological: She is alert and  oriented to person, place, and time.  Psychiatric: Affect normal.    I have personally reviewed below laboratory results.  CBC    Component Value Date/Time   WBC 6.9 03/31/2019 1426   RBC 4.37 03/31/2019 1426   HGB 13.9 03/31/2019 1426   HGB 12.3 03/17/2018 1552   HCT 43.2 03/31/2019 1426   HCT 37.9 03/17/2018 1552   PLT 243 03/31/2019 1426   PLT 249 03/17/2018 1552   MCV 98.9 03/31/2019 1426   MCV 95 03/17/2018 1552   MCH 31.8 03/31/2019 1426   MCHC 32.2 03/31/2019 1426   RDW 12.2  03/31/2019 1426   RDW 11.3 (L) 03/17/2018 1552   LYMPHSABS 1.5 03/31/2019 1426   LYMPHSABS 2.1 08/14/2016 1157   MONOABS 0.4 03/31/2019 1426   EOSABS 0.1 03/31/2019 1426   EOSABS 0.1 08/14/2016 1157   BASOSABS 0.0 03/31/2019 1426   BASOSABS 0.0 08/14/2016 1157    CMP     Component Value Date/Time   NA 137 03/31/2019 1426   NA 137 12/12/2018   K 4.8 03/31/2019 1426   CL 102 03/31/2019 1426   CL 105 04/28/2018   CO2 26 03/31/2019 1426   CO2 27 04/28/2018   GLUCOSE 116 (H) 03/31/2019 1426   BUN 38 (H) 03/31/2019 1426   BUN 33 (A) 12/12/2018   CREATININE 1.71 (H) 03/31/2019 1426   CREATININE 1.42 (H) 03/11/2017 1422   CALCIUM 9.3 03/31/2019 1426   CALCIUM 9.8 04/28/2018   PROT 7.6 03/31/2019 1426   PROT 6.6 03/17/2018 1552   ALBUMIN 4.4 03/31/2019 1426   ALBUMIN 4.5 04/28/2018   AST 24 03/31/2019 1426   ALT 18 03/31/2019 1426   ALKPHOS 109 03/31/2019 1426   BILITOT 0.7 03/31/2019 1426   BILITOT <0.2 03/17/2018 1552   GFRNONAA 26 (L) 03/31/2019 1426   GFRNONAA 33 (L) 03/11/2017 1422   GFRAA 30 (L) 03/31/2019 1426   GFRAA 38 (L) 03/11/2017 1422     RADIOGRAPHIC STUDIES: I have personally reviewed the radiological images as listed and agreed with the findings in the report.  No results found.  Assessment and Plan: 1. Night sweat   2. Chronic kidney disease, unspecified CKD stage     Clinically patient does not have any other constitutional symptoms except night sweating.  No  report of lymphadenopathy. Night sweats etiologies have multiple differentials, ranging from endocrine, infectious, oncological, neurological etiologies, etc.  I will check UA, CBC, smear, LDH, ESR, TB, thyroid panel, multiple myeloma panel, CMP, occult stool. Patient expresses her wishes of not to proceed aggressive diagnostic work-up or treatments given her age. CKD, avoid nephrotoxins. Follow Up Instructions: 1 to 2 weeks to discuss blood work.   I discussed the assessment and treatment plan with the patient. The patient was provided an opportunity to ask questions and all were answered. The patient agreed with the plan and demonstrated an understanding of the instructions.  The patient was advised to call back or seek an in-person evaluation if the symptoms worsen or if the condition fails to improve as anticipated.   I provided 45 minutes of face-to-face video visit time during this encounter, and > 50% was spent counseling as documented under my assessment & plan.   , MD 04/01/2019 11:02 PM  

## 2019-04-02 DIAGNOSIS — R61 Generalized hyperhidrosis: Secondary | ICD-10-CM | POA: Diagnosis not present

## 2019-04-03 ENCOUNTER — Other Ambulatory Visit: Payer: Self-pay

## 2019-04-03 DIAGNOSIS — R61 Generalized hyperhidrosis: Secondary | ICD-10-CM

## 2019-04-03 DIAGNOSIS — N189 Chronic kidney disease, unspecified: Secondary | ICD-10-CM

## 2019-04-03 LAB — URINALYSIS, COMPLETE (UACMP) WITH MICROSCOPIC
Bilirubin Urine: NEGATIVE
Glucose, UA: NEGATIVE mg/dL
Hgb urine dipstick: NEGATIVE
Ketones, ur: NEGATIVE mg/dL
Leukocytes,Ua: NEGATIVE
Nitrite: NEGATIVE
Protein, ur: NEGATIVE mg/dL
Specific Gravity, Urine: 1.018 (ref 1.005–1.030)
pH: 5 (ref 5.0–8.0)

## 2019-04-03 LAB — KAPPA/LAMBDA LIGHT CHAINS
Kappa free light chain: 37.6 mg/L — ABNORMAL HIGH (ref 3.3–19.4)
Kappa, lambda light chain ratio: 1.34 (ref 0.26–1.65)
Lambda free light chains: 28.1 mg/L — ABNORMAL HIGH (ref 5.7–26.3)

## 2019-04-03 LAB — OCCULT BLOOD X 1 CARD TO LAB, STOOL
Fecal Occult Bld: NEGATIVE
Fecal Occult Bld: NEGATIVE
Fecal Occult Bld: NEGATIVE

## 2019-04-03 LAB — MULTIPLE MYELOMA PANEL, SERUM
Albumin SerPl Elph-Mcnc: 4 g/dL (ref 2.9–4.4)
Albumin/Glob SerPl: 1.4 (ref 0.7–1.7)
Alpha 1: 0.2 g/dL (ref 0.0–0.4)
Alpha2 Glob SerPl Elph-Mcnc: 0.8 g/dL (ref 0.4–1.0)
B-Globulin SerPl Elph-Mcnc: 1 g/dL (ref 0.7–1.3)
Gamma Glob SerPl Elph-Mcnc: 1 g/dL (ref 0.4–1.8)
Globulin, Total: 3 g/dL (ref 2.2–3.9)
IgA: 208 mg/dL (ref 64–422)
IgG (Immunoglobin G), Serum: 1062 mg/dL (ref 586–1602)
IgM (Immunoglobulin M), Srm: 84 mg/dL (ref 26–217)
Total Protein ELP: 7 g/dL (ref 6.0–8.5)

## 2019-04-03 LAB — QUANTIFERON-TB GOLD PLUS (RQFGPL)
QuantiFERON Mitogen Value: >10 IU/mL
QuantiFERON Nil Value: 0.03 IU/mL
QuantiFERON TB1 Ag Value: 0.02 IU/mL
QuantiFERON TB2 Ag Value: 0.02 IU/mL

## 2019-04-03 LAB — QUANTIFERON-TB GOLD PLUS: QuantiFERON-TB Gold Plus: NEGATIVE

## 2019-04-05 NOTE — Progress Notes (Signed)
Patient pre screened for office appointment, no questions or concerns today. Patient reminded of upcoming appointment time and date. 

## 2019-04-06 ENCOUNTER — Encounter: Payer: Self-pay | Admitting: Oncology

## 2019-04-06 ENCOUNTER — Inpatient Hospital Stay (HOSPITAL_BASED_OUTPATIENT_CLINIC_OR_DEPARTMENT_OTHER): Payer: PPO | Admitting: Oncology

## 2019-04-06 ENCOUNTER — Telehealth: Payer: Self-pay

## 2019-04-06 DIAGNOSIS — R61 Generalized hyperhidrosis: Secondary | ICD-10-CM

## 2019-04-06 DIAGNOSIS — N189 Chronic kidney disease, unspecified: Secondary | ICD-10-CM | POA: Diagnosis not present

## 2019-04-06 NOTE — Telephone Encounter (Signed)
Do you know if this can be done?   Thanks,   -Mickel Baas

## 2019-04-06 NOTE — Telephone Encounter (Signed)
Copied from [place] [id number]. Topic: Appointment Scheduling - Scheduling Inquiry for Clinic >> Apr 06, 2019  9:36 AM Rayann Heman wrote: Reason for CRM: pt would like a call back for upcoming virtual visit. Pt would like daughter involved but she will not be at the house with her and would like to know if two people can participate in the virtual visit. Please advise

## 2019-04-06 NOTE — Progress Notes (Addendum)
HEMATOLOGY-ONCOLOGY TeleHEALTH VISIT PROGRESS NOTE  I connected with Becky Prince on 04/06/19 at  8:45 AM EST by video enabled telemedicine visit and verified that I am speaking with the correct person using two identifiers. I discussed the limitations, risks, security and privacy concerns of performing an evaluation and management service by telemedicine and the availability of in-person appointments. I also discussed with the patient that there may be a patient responsible charge related to this service. The patient expressed understanding and agreed to proceed.   Other persons participating in the visit and their role in the encounter:    Daughter who helps setting up virtual visit.  Patient's location: Home  Provider's location: office Chief Complaint: Follow-up for Night sweating   INTERVAL HISTORY Becky Prince is a 85 y.o. female who has above history reviewed by me today presents for follow up visit for management of  night sweating Problems and complaints are listed below:   patient had blood work done during interval.  She has no new complaints. Intermittent night sweating associated with nightmares. Occasionally headache.  Appetite is good. Review of Systems  Constitutional: Negative for appetite change, chills, fatigue and unexpected weight change.       Night sweating  HENT:   Negative for hearing loss and voice change.   Eyes: Negative for eye problems.  Respiratory: Negative for chest tightness and cough.   Cardiovascular: Negative for chest pain.  Gastrointestinal: Negative for abdominal distention, abdominal pain and blood in stool.  Endocrine: Negative for hot flashes.  Genitourinary: Negative for difficulty urinating and frequency.   Musculoskeletal: Negative for arthralgias.  Skin: Negative for itching and rash.  Neurological: Positive for headaches. Negative for extremity weakness.  Hematological: Negative for adenopathy.  Psychiatric/Behavioral: Negative for  confusion.    Past Medical History:  Diagnosis Date  . Depression   . Environmental allergies   . Hypercholesteremia   . Hypertension    Past Surgical History:  Procedure Laterality Date  . ABDOMINAL HYSTERECTOMY    . CATARACT EXTRACTION W/PHACO Right 06/12/2015   Procedure: CATARACT EXTRACTION PHACO AND INTRAOCULAR LENS PLACEMENT (IOC);  Surgeon: Leandrew Koyanagi, MD;  Location: [facility];  Service: Ophthalmology;  Laterality: Right;    Family History  Problem Relation Age of Onset  . Heart disease Mother        CHF  . Hypertension Mother   . Cancer Father        prostate  . Cancer Sister        breast  . Kidney disease Brother   . Breast cancer Neg Hx     Social History   Socioeconomic History  . Marital status: Widowed    Spouse name: Not on file  . Number of children: Not on file  . Years of education: Not on file  . Highest education level: Not on file  Occupational History  . Not on file  Tobacco Use  . Smoking status: Never Smoker  . Smokeless tobacco: Never Used  Substance and Sexual Activity  . Alcohol use: No    Alcohol/week: 0.0 standard drinks  . Drug use: No  . Sexual activity: Not on file  Other Topics Concern  . Not on file  Social History Narrative  . Not on file   Social Determinants of Health   Financial Resource Strain:   . Difficulty of Paying Living Expenses: Not on file  Food Insecurity:   . Worried About Charity fundraiser in the Last Year: Not  on file  . Ran Out of Food in the Last Year: Not on file  Transportation Needs:   . Lack of Transportation (Medical): Not on file  . Lack of Transportation (Non-Medical): Not on file  Physical Activity:   . Days of Exercise per Week: Not on file  . Minutes of Exercise per Session: Not on file  Stress:   . Feeling of Stress : Not on file  Social Connections:   . Frequency of Communication with Friends and Family: Not on file  . Frequency of Social Gatherings with Friends and  Family: Not on file  . Attends Religious Services: Not on file  . Active Member of Clubs or Organizations: Not on file  . Attends Archivist Meetings: Not on file  . Marital Status: Not on file  Intimate Partner Violence:   . Fear of Current or Ex-Partner: Not on file  . Emotionally Abused: Not on file  . Physically Abused: Not on file  . Sexually Abused: Not on file    Current Outpatient Medications on File Prior to Visit  Medication Sig Dispense Refill  . amLODipine (NORVASC) 5 MG tablet Take 1 tablet (5 mg total) by mouth daily. 90 tablet 3  . ASPIRIN 81 PO TAKE 1 TABLET BY MOUTH EVERY DAY    . atorvastatin (LIPITOR) 40 MG tablet TAKE 1 TABLET BY MOUTH DAILY AT 6 PM. 90 tablet 3  . citalopram (CELEXA) 10 MG tablet TAKE 1 TABLET BY MOUTH EVERY DAY 90 tablet 1  . donepezil (ARICEPT) 10 MG tablet Take 1 tablet (10 mg total) by mouth at bedtime. 90 tablet 3  . fluticasone (FLONASE) 50 MCG/ACT nasal spray Place 1 spray into both nostrils daily.    . meloxicam (MOBIC) 7.5 MG tablet TAKE 1 TABLET BY MOUTH EVERY DAY 90 tablet 0  . mirtazapine (REMERON) 15 MG tablet TAKE 1 TABLET BY MOUTH EVERYDAY AT BEDTIME 90 tablet 3  . Multiple Vitamin (MULTI-VITAMIN) tablet Take by mouth.    . spironolactone (ALDACTONE) 25 MG tablet Take 1 tablet (25 mg total) by mouth daily. 90 tablet 3  . traMADol (ULTRAM) 50 MG tablet TAKE 1 TABLET BY MOUTH FOUR TIMES A DAY AS NEEDED     No current facility-administered medications on file prior to visit.    Allergies  Allergen Reactions  . Naproxen Swelling    Had lip swelling 20 yrs ago - not sure if caused by oxycodone or naproxen  . Oxycodone-Aspirin Swelling    Had lip swelling 20 yrs ago. Not sure if caused by oxycodone or naproxen.  . Prednisone Diarrhea       Observations/Objective: There were no vitals filed for this visit. There is no height or weight on file to calculate BMI.  Physical Exam  Constitutional: She is oriented to person,  place, and time. No distress.  Neurological: She is alert and oriented to person, place, and time.  Psychiatric: Mood normal.    CBC    Component Value Date/Time   WBC 6.9 03/31/2019 1426   RBC 4.37 03/31/2019 1426   HGB 13.9 03/31/2019 1426   HGB 12.3 03/17/2018 1552   HCT 43.2 03/31/2019 1426   HCT 37.9 03/17/2018 1552   PLT 243 03/31/2019 1426   PLT 249 03/17/2018 1552   MCV 98.9 03/31/2019 1426   MCV 95 03/17/2018 1552   MCH 31.8 03/31/2019 1426   MCHC 32.2 03/31/2019 1426   RDW 12.2 03/31/2019 1426   RDW 11.3 (L) 03/17/2018 1552  LYMPHSABS 1.5 03/31/2019 1426   LYMPHSABS 2.1 08/14/2016 1157   MONOABS 0.4 03/31/2019 1426   EOSABS 0.1 03/31/2019 1426   EOSABS 0.1 08/14/2016 1157   BASOSABS 0.0 03/31/2019 1426   BASOSABS 0.0 08/14/2016 1157    CMP     Component Value Date/Time   NA 137 03/31/2019 1426   NA 137 12/12/2018 0000   K 4.8 03/31/2019 1426   CL 102 03/31/2019 1426   CL 105 04/28/2018 0000   CO2 26 03/31/2019 1426   CO2 27 04/28/2018 0000   GLUCOSE 116 (H) 03/31/2019 1426   BUN 38 (H) 03/31/2019 1426   BUN 33 (A) 12/12/2018 0000   CREATININE 1.71 (H) 03/31/2019 1426   CREATININE 1.42 (H) 03/11/2017 1422   CALCIUM 9.3 03/31/2019 1426   CALCIUM 9.8 04/28/2018 0000   PROT 7.6 03/31/2019 1426   PROT 6.6 03/17/2018 1552   ALBUMIN 4.4 03/31/2019 1426   ALBUMIN 4.5 04/28/2018 0000   AST 24 03/31/2019 1426   ALT 18 03/31/2019 1426   ALKPHOS 109 03/31/2019 1426   BILITOT 0.7 03/31/2019 1426   BILITOT <0.2 03/17/2018 1552   GFRNONAA 26 (L) 03/31/2019 1426   GFRNONAA 33 (L) 03/11/2017 1422   GFRAA 30 (L) 03/31/2019 1426   GFRAA 38 (L) 03/11/2017 1422     Assessment and Plan: 1. Night sweat   2. Chronic kidney disease, unspecified CKD stage    Labs reviewed and discussed with patient. Patient has a completely normal CBC,normal LDH, multiple myeloma panel showed normal light chain ratio, negative monoclonal protein.  Negative ESR, CRP, negative HIV.   Normal thyroid function. Chronic kidney disease Creatinine worsened compared to 3 months ago. Negative stool occult blood. .Urine unremarkable. Discussed with patient and daughter that further comprehensive work-up with CT images can be considered if patient continues to have unexplained night sweating, or develops new constitutional symptoms.  Her blood work does not explain her symptoms.  Daughter also understands the limitations of virtual visit. Night sweating associated with nightmare, can be associated with patient's emotional changes in mood.  Daughter then mentioned that patient lost her husband last year and this can contribute to her recent mood change/nightmares.  Per daughter, patient is most likely not willing to proceed with aggressive work-up at this point.  I think observation at this point is reasonable. Recommend patient to follow-up with primary care provider and nephrologist.  If she develops more symptoms, I will see her in the clinic for further evaluation. I discussed the assessment and treatment plan with the patient. The patient was provided an opportunity to ask questions and all were answered. The patient agreed with the plan and demonstrated an understanding of the instructions.  The patient was advised to call back or seek an in-person evaluation if the symptoms worsen or if the condition fails to improve as anticipated.    The patient was provided an opportunity to ask questions and all were answered. The patient agreed with the plan and demonstrated an understanding of the instructions.  The patient was advised to call back or seek an in-person evaluation if the symptoms worsen or if the condition fails to improve as anticipated I provided 15 minutes of face-to-face video visit time during this encounter, and > 50% was spent counseling as documented under my assessment & plan.   Earlie Server, MD 04/06/2019 9:19 PM

## 2019-04-06 NOTE — Telephone Encounter (Signed)
LMTCB 04/06/2019  Thanks,   -Mickel Baas

## 2019-04-06 NOTE — Telephone Encounter (Signed)
I know there is a way to do this with a mychart virtual visit when we invite the other person once we start.  I'm not sure about Doxy, because it would give them a separate session.  Some patients have also been able to have their family on the phone on speaker during the virtual visit so they can participate as well.

## 2019-04-06 NOTE — Telephone Encounter (Signed)
Pt wanted to change her appointment to a phone visit.    Thanks,   -Mickel Baas

## 2019-04-10 ENCOUNTER — Ambulatory Visit (INDEPENDENT_AMBULATORY_CARE_PROVIDER_SITE_OTHER): Payer: PPO | Admitting: Family Medicine

## 2019-04-10 DIAGNOSIS — R61 Generalized hyperhidrosis: Secondary | ICD-10-CM | POA: Diagnosis not present

## 2019-04-10 DIAGNOSIS — K5900 Constipation, unspecified: Secondary | ICD-10-CM | POA: Diagnosis not present

## 2019-04-10 NOTE — Progress Notes (Signed)
Patient: Becky Prince Female    DOB: 11/23/1925   85 y.o.   MRN: [id number] Visit Date: 04/10/2019  Today's Provider: [name], MD   Chief Complaint  Patient presents with  . Follow-up   Subjective:    Virtual Visit via Telephone Note  I connected with Becky Prince on 04/10/19 at  1:20 PM EST by telephone and verified that I am speaking with the correct person using two identifiers.  Location: Patient location: home Provider location: [facility] Persons involved in the visit: patient, provider   I discussed the limitations, risks, security and privacy concerns of performing an evaluation and management service by telephone and the availability of in person appointments. I also discussed with the patient that there may be a patient responsible charge related to this service. The patient expressed understanding and agreed to proceed.   HPI  Follow up for night sweats  The patient was last seen for this 3 weeks ago. Changes made at last visit include no changes. Patient referred to hemo-oncology.   She reports excellent compliance with treatment. She feels that condition is Improved. She is not having side effects.   She had work-up with Oncology that felt she was low risk for any occult malignancy.  Joint decision was made to avoid any invasive workup give patient's advanced age.  Denies any nightmares or mood changes. ------------------------------------------------------------------------------------ Constipation: Taking stool softener qod that helps and gives a soft BM.  Feels constipated in between. Wonders if she could take the stool softener daily.  States that she does not like Miralax.   Allergies  Allergen Reactions  . Naproxen Swelling    Had lip swelling 20 yrs ago - not sure if caused by oxycodone or naproxen  . Oxycodone-Aspirin Swelling    Had lip swelling 20 yrs ago. Not sure if caused by oxycodone or naproxen.  .  Prednisone Diarrhea     Current Outpatient Medications:  .  amLODipine (NORVASC) 5 MG tablet, Take 1 tablet (5 mg total) by mouth daily., Disp: 90 tablet, Rfl: 3 .  ASPIRIN 81 PO, TAKE 1 TABLET BY MOUTH EVERY DAY, Disp: , Rfl:  .  atorvastatin (LIPITOR) 40 MG tablet, TAKE 1 TABLET BY MOUTH DAILY AT 6 PM., Disp: 90 tablet, Rfl: 3 .  citalopram (CELEXA) 10 MG tablet, TAKE 1 TABLET BY MOUTH EVERY DAY, Disp: 90 tablet, Rfl: 1 .  donepezil (ARICEPT) 10 MG tablet, Take 1 tablet (10 mg total) by mouth at bedtime., Disp: 90 tablet, Rfl: 3 .  fluticasone (FLONASE) 50 MCG/ACT nasal spray, Place 1 spray into both nostrils daily., Disp: , Rfl:  .  meloxicam (MOBIC) 7.5 MG tablet, TAKE 1 TABLET BY MOUTH EVERY DAY, Disp: 90 tablet, Rfl: 0 .  mirtazapine (REMERON) 15 MG tablet, TAKE 1 TABLET BY MOUTH EVERYDAY AT BEDTIME, Disp: 90 tablet, Rfl: 3 .  Multiple Vitamin (MULTI-VITAMIN) tablet, Take by mouth., Disp: , Rfl:  .  spironolactone (ALDACTONE) 25 MG tablet, Take 1 tablet (25 mg total) by mouth daily., Disp: 90 tablet, Rfl: 3 .  traMADol (ULTRAM) 50 MG tablet, TAKE 1 TABLET BY MOUTH FOUR TIMES A DAY AS NEEDED, Disp: , Rfl:   Review of Systems  Constitutional: Negative.   HENT: Negative.   Respiratory: Negative.   Cardiovascular: Negative.   Gastrointestinal: Positive for constipation.  Genitourinary: Negative.   Neurological: Negative.   Hematological: Negative for adenopathy.  Psychiatric/Behavioral: Negative.     Social History  Tobacco Use  . Smoking status: Never Smoker  . Smokeless tobacco: Never Used  Substance Use Topics  . Alcohol use: No    Alcohol/week: 0.0 standard drinks      Objective:   There were no vitals taken for this visit. There were no vitals filed for this visit.There is no height or weight on file to calculate BMI.   Physical Exam Speaks in full sentences in NAD  No results found for any visits on 04/10/19.     Assessment & Plan    I discussed the  assessment and treatment plan with the patient. The patient was provided an opportunity to ask questions and all were answered. The patient agreed with the plan and demonstrated an understanding of the instructions.   The patient was advised to call back or seek an in-person evaluation if the symptoms worsen or if the condition fails to improve as anticipated.   Problem List Items Addressed This Visit      Other   Constipation    Chronic problem Likely worsened by some of her medications such as tramadol She has found a stool softener to be helpful We discussed that this would be safe to use every day Discussed goal of 1 soft bowel movement daily without straining      Night sweats    Persistent and unchanged from her last visit No fevers or infectious symptoms No other constitutional symptoms She has had lab work that was unrevealing She has had evaluation by oncology that was reassuring They have tried decreasing her Celexa with no improvement in her symptoms We will continue to monitor          Return in about 3 months (around 07/09/2019) for [place].   The entirety of the information documented in the History of Present Illness, Review of Systems and Physical Exam were personally obtained by me. Portions of this information were initially documented by Ashley Royalty, CMA and reviewed by me for thoroughness and accuracy.    Medina Degraffenreid, [name], MD MPH [facility]

## 2019-04-11 NOTE — Assessment & Plan Note (Signed)
Chronic problem Likely worsened by some of her medications such as tramadol She has found a stool softener to be helpful We discussed that this would be safe to use every day Discussed goal of 1 soft bowel movement daily without straining

## 2019-04-11 NOTE — Assessment & Plan Note (Signed)
Persistent and unchanged from her last visit No fevers or infectious symptoms No other constitutional symptoms She has had lab work that was unrevealing She has had evaluation by oncology that was reassuring They have tried decreasing her Celexa with no improvement in her symptoms We will continue to monitor

## 2019-04-17 ENCOUNTER — Other Ambulatory Visit: Payer: Self-pay | Admitting: Family Medicine

## 2019-04-17 ENCOUNTER — Telehealth: Payer: Self-pay | Admitting: Family Medicine

## 2019-04-17 MED ORDER — FLUTICASONE PROPIONATE 50 MCG/ACT NA SUSP: 1.0000 | Freq: Every day | NASAL | 5 refills | Status: DC

## 2019-04-17 NOTE — Telephone Encounter (Signed)
Spoke with Becky Prince at [facility] who states that the refill for Citalopram 20mg  was a prescription from May. Most recent prescription was from 03/08/19 and was for Citalopram mg. Pharmacy states that the prescription will be filled for Citalopram 10mg . Change in dosage from 20mg  to 10mg  noted in OV notes from 01/2019.  Pt called and notified that prescription for 10 mg was going to be filled today. Pt advised that she will need to contact the pharmacy to see when the prescription will be available for pick up. Understanding verbalized.

## 2019-04-17 NOTE — Telephone Encounter (Signed)
Pt states the pharmacy gave her 20 mg of the  citalopram (CELEXA)   pt states she called them and told them they made mistake, but pt states they told her she must call her dr.  Abbott Pao states she has been on  citalopram (CELEXA) 10 MG tablet a long time and no one told her it had changed.  [id number] - [place], [place] - [address] Phone:  [phone]  Fax:  [phone]

## 2019-04-17 NOTE — Telephone Encounter (Signed)
From [facility] 

## 2019-04-17 NOTE — Telephone Encounter (Signed)
Pt request refill   fluticasone (FLONASE) 50 MCG/ACT nasal spray  Dr B has never written for the pt.  Previous provider had prescribed this Rx.. Needs to be new Rx  [id number] - [place], [place] - [address] Phone:  [phone]  Fax:  [phone]

## 2019-05-18 DIAGNOSIS — H40003 Preglaucoma, unspecified, bilateral: Secondary | ICD-10-CM | POA: Diagnosis not present

## 2019-05-24 DIAGNOSIS — M5416 Radiculopathy, lumbar region: Secondary | ICD-10-CM | POA: Diagnosis not present

## 2019-05-24 DIAGNOSIS — M5136 Other intervertebral disc degeneration, lumbar region: Secondary | ICD-10-CM | POA: Diagnosis not present

## 2019-05-24 DIAGNOSIS — M48062 Spinal stenosis, lumbar region with neurogenic claudication: Secondary | ICD-10-CM | POA: Diagnosis not present

## 2019-06-11 ENCOUNTER — Other Ambulatory Visit: Payer: Self-pay | Admitting: Physician Assistant

## 2019-06-11 NOTE — Telephone Encounter (Signed)
Requested Prescriptions  Pending Prescriptions Disp Refills  . meloxicam (MOBIC) 7.5 MG tablet [Pharmacy Med Name: MELOXICAM 7.5 MG TABLET] 90 tablet 0    Sig: TAKE 1 TABLET BY MOUTH EVERY DAY     Analgesics:  COX2 Inhibitors Failed - 06/11/2019  2:04 PM      Failed - Cr in normal range and within 360 days    Creat  Date Value Ref Range Status  03/11/2017 1.42 (H) 0.60 - 0.88 mg/dL Final    Comment:    For patients >84 years of age, the reference limit for Creatinine is approximately 13% higher for people identified as African-American. .    Creatinine, Ser  Date Value Ref Range Status  03/31/2019 1.71 (H) 0.44 - 1.00 mg/dL Final         Passed - HGB in normal range and within 360 days    Hemoglobin  Date Value Ref Range Status  03/31/2019 13.9 12.0 - 15.0 g/dL Final  03/17/2018 12.3 11.1 - 15.9 g/dL Final         Passed - Patient is not pregnant      Passed - Valid encounter within last 12 months    Recent Outpatient Visits          2 months ago Night sweats   [facility] [place], [name], MD   2 months ago Night sweats   [facility] [place], [name], MD   3 months ago Night sweats   [facility] [place], [name], MD   8 months ago Essential hypertension   [facility] [place], [name], MD   1 year ago Medicare annual wellness visit, subsequent   [facility] [place], [name], MD

## 2019-06-13 DIAGNOSIS — I10 Essential (primary) hypertension: Secondary | ICD-10-CM | POA: Diagnosis not present

## 2019-06-13 DIAGNOSIS — N184 Chronic kidney disease, stage 4 (severe): Secondary | ICD-10-CM | POA: Diagnosis not present

## 2019-06-26 NOTE — Progress Notes (Signed)
Subjective:   Becky Prince is a 86 y.o. female who presents for Medicare Annual (Subsequent) preventive examination.    This visit is being conducted through telemedicine due to the COVID-19 pandemic. This patient has given me verbal consent via doximity to conduct this visit, patient states they are participating from their home address. Some vital signs may be absent or patient reported.    Patient identification: identified by name, DOB, and current address  Review of Systems:  N/A  Cardiac Risk Factors include: advanced age (>80men, >67 women);dyslipidemia;hypertension     Objective:     Vitals: There were no vitals taken for this visit.  There is no height or weight on file to calculate BMI. Unable to obtain vitals due to visit being conducted via telephonically.   Advanced Directives 06/27/2019 04/05/2019 03/31/2019 03/10/2017 09/28/2016 09/28/2016 08/14/2016  Does Patient Have a Medical Advance Directive? Yes Yes Yes Yes No No Yes  Type of Paramedic of [place];Living will [place];Living will Living will;Healthcare Power of [place];Living will - - [place];Living will  Copy of [place] in Chart? No - copy requested No - copy requested - No - copy requested - - -  Would patient like information on creating a medical advance directive? - No - Patient declined No - Patient declined - No - Patient declined - -    Tobacco Social History   Tobacco Use  Smoking Status Never Smoker  Smokeless Tobacco Never Used     Counseling given: Not Answered   Clinical Intake:  Pre-visit preparation completed: Yes  Pain : No/denies pain Pain Score: 0-No pain     Nutritional Risks: None Diabetes: No  How often do you need to have someone help you when you read instructions, pamphlets, or other written materials from your doctor or pharmacy?: 1 - Never  Interpreter  Needed?: No  Information entered by :: Mmarkoski, PLN  Past Medical History:  Diagnosis Date  . Depression   . Environmental allergies   . Hypercholesteremia   . Hypertension    Past Surgical History:  Procedure Laterality Date  . ABDOMINAL HYSTERECTOMY    . CATARACT EXTRACTION W/PHACO Right 06/12/2015   Procedure: CATARACT EXTRACTION PHACO AND INTRAOCULAR LENS PLACEMENT (IOC);  Surgeon: Leandrew Koyanagi, MD;  Location: [facility];  Service: Ophthalmology;  Laterality: Right;   Family History  Problem Relation Age of Onset  . Heart disease Mother        CHF  . Hypertension Mother   . Cancer Father        prostate  . Cancer Sister        breast  . Kidney disease Brother   . Breast cancer Neg Hx    Social History   Socioeconomic History  . Marital status: Widowed    Spouse name: Not on file  . Number of children: 1  . Years of education: Not on file  . Highest education level: 11th grade  Occupational History  . Occupation: retired  Tobacco Use  . Smoking status: Never Smoker  . Smokeless tobacco: Never Used  Substance and Sexual Activity  . Alcohol use: No    Alcohol/week: 0.0 standard drinks  . Drug use: No  . Sexual activity: Not on file  Other Topics Concern  . Not on file  Social History Narrative  . Not on file   Social Determinants of Health   Financial Resource Strain:  Low Risk   . Difficulty of Paying Living Expenses: Not hard at all  Food Insecurity: No Food Insecurity  . Worried About Charity fundraiser in the Last Year: Never true  . Ran Out of Food in the Last Year: Never true  Transportation Needs: No Transportation Needs  . Lack of Transportation (Medical): No  . Lack of Transportation (Non-Medical): No  Physical Activity: Inactive  . Days of Exercise per Week: 0 days  . Minutes of Exercise per Session: 0 min  Stress: No Stress Concern Present  . Feeling of Stress : Not at all  Social Connections: Moderately Isolated  .  Frequency of Communication with Friends and Family: More than three times a week  . Frequency of Social Gatherings with Friends and Family: Once a week  . Attends Religious Services: Never  . Active Member of Clubs or Organizations: No  . Attends Archivist Meetings: Never  . Marital Status: Widowed    Outpatient Encounter Medications as of 06/27/2019  Medication Sig  . amLODipine (NORVASC) 5 MG tablet Take 1 tablet (5 mg total) by mouth daily.  . ASPIRIN 81 PO TAKE 1 TABLET BY MOUTH EVERY DAY  . atorvastatin (LIPITOR) 40 MG tablet TAKE 1 TABLET BY MOUTH DAILY AT 6 PM.  . citalopram (CELEXA) 10 MG tablet TAKE 1 TABLET BY MOUTH EVERY DAY  . donepezil (ARICEPT) 10 MG tablet Take 1 tablet (10 mg total) by mouth at bedtime.  . fluticasone (FLONASE) 50 MCG/ACT nasal spray Place 1 spray into both nostrils daily.  . meloxicam (MOBIC) 7.5 MG tablet TAKE 1 TABLET BY MOUTH EVERY DAY  . mirtazapine (REMERON) 15 MG tablet TAKE 1 TABLET BY MOUTH EVERYDAY AT BEDTIME  . Multiple Vitamin (MULTI-VITAMIN) tablet Take by mouth.  . spironolactone (ALDACTONE) 25 MG tablet Take 1 tablet (25 mg total) by mouth daily.  . traMADol (ULTRAM) 50 MG tablet TAKE 1 TABLET BY MOUTH FOUR TIMES A DAY AS NEEDED   No facility-administered encounter medications on file as of 06/27/2019.    Activities of Daily Living In your present state of health, do you have any difficulty performing the following activities: 06/27/2019  Hearing? N  Vision? N  Difficulty concentrating or making decisions? N  Walking or climbing stairs? N  Dressing or bathing? N  Doing errands, shopping? N  Preparing Food and eating ? N  Using the Toilet? N  In the past six months, have you accidently leaked urine? Y  Comment Seldom issues with urges.  Do you have problems with loss of bowel control? N  Managing your Medications? N  Managing your Finances? Y  Comment Daughter assists with finances.  Housekeeping or managing your  Housekeeping? N  Some recent data might be hidden    Patient Care Team: Virginia Crews, MD as PCP - General (Family Medicine) [name], MD as Referring Physician (Physical Medicine and Rehabilitation) Murlean Iba, MD (Nephrology) Leandrew Koyanagi, MD as Referring Physician (Ophthalmology)    Assessment:   This is a routine wellness examination for Becky Prince.  Exercise Activities and Dietary recommendations Current Exercise Habits: The patient does not participate in regular exercise at present, Exercise limited by: None identified  Goals    . Increase water intake     Recommend increasing water intake to 4-6 glasses a day.        Fall Risk: Fall Risk  06/27/2019 09/15/2018 03/17/2018 03/10/2017 08/14/2016  Falls in the past year? 0 1 0 Yes No  Number falls in past yr: 0 1 0 2 or more -  Injury with Fall? 0 0 - No -  Follow up - - - Falls prevention discussed -    FALL RISK PREVENTION PERTAINING TO THE HOME:  Any stairs in or around the home? Yes  If so, are there any without handrails? No   Home free of loose throw rugs in walkways, pet beds, electrical cords, etc? Yes  Adequate lighting in your home to reduce risk of falls? Yes   ASSISTIVE DEVICES UTILIZED TO PREVENT FALLS:  Life alert? Yes  Use of a cane, walker or w/c? No  Grab bars in the bathroom? No  Shower chair or bench in shower? Yes  Elevated toilet seat or a handicapped toilet? Yes    TIMED UP AND GO:  Was the test performed? No .    Depression Screen PHQ 2/9 Scores 02/13/2019 09/15/2018 03/17/2018 10/13/2017  PHQ - 2 Score 0 0 0 4  PHQ- 9 Score 1 0 0 5     Cognitive Function: Declined today.        Immunization History  Administered Date(s) Administered  . Influenza, High Dose Seasonal PF 02/09/2015, 03/06/2016, 01/18/2017, 03/17/2018, 12/27/2018  . PFIZER SARS-COV-2 Vaccination 05/16/2019, 06/07/2019  . Pneumococcal Conjugate-13 08/29/2014  . Pneumococcal Polysaccharide-23  03/06/2016  . Td 03/26/2003, 01/10/2011  . Tdap 01/10/2011  . Zoster 10/14/2006    Qualifies for Shingles Vaccine? Yes  Zostavax completed 10/14/06. Due for Shingrix. Pt has been advised to call insurance company to determine out of pocket expense. Advised may also receive vaccine at local pharmacy or Health Dept. Verbalized acceptance and understanding.  Tdap: Up to date  Flu Vaccine: Up to date  Pneumococcal Vaccine: Completed series  Screening Tests Health Maintenance  Topic Date Due  . DEXA SCAN  10/07/2017  . TETANUS/TDAP  01/09/2021  . INFLUENZA VACCINE  Completed  . PNA vac Low Risk Adult  Completed    Cancer Screenings:  Colorectal Screening: No longer required.   Mammogram: No longer required.   Bone Density: Completed 10/08/15. Results reflect OSTEOPOROSIS. Repeat every 2 years. Ordered today. Pt aware the office will call re: appt.  Lung Cancer Screening: (Low Dose CT Chest recommended if Age 18-80 years, 30 pack-year currently smoking OR have quit w/in 15years.) does not qualify.   Additional Screening:  Vision Screening: Recommended annual ophthalmology exams for early detection of glaucoma and other disorders of the eye.  Dental Screening: Recommended annual dental exams for proper oral hygiene  Community Resource Referral:  CRR required this visit?  No       Plan:  I have personally reviewed and addressed the Medicare Annual Wellness questionnaire and have noted the following in the patient's chart:  A. Medical and social history B. Use of alcohol, tobacco or illicit drugs  C. Current medications and supplements D. Functional ability and status E.  Nutritional status F.  Physical activity G. Advance directives H. List of other physicians I.  Hospitalizations, surgeries, and ER visits in previous 12 months J.  [place] such as hearing and vision if needed, cognitive and depression L. Referrals and appointments   In addition, I have  reviewed and discussed with patient certain preventive protocols, quality metrics, and best practice recommendations. A written personalized care plan for preventive services as well as general preventive health recommendations were provided to patient. Nurse Health Advisor  Signed,    Barrington Worley [place], [place]  [id number] Nurse Health Advisor  Nurse Notes: None.

## 2019-06-27 ENCOUNTER — Other Ambulatory Visit: Payer: Self-pay

## 2019-06-27 ENCOUNTER — Ambulatory Visit (INDEPENDENT_AMBULATORY_CARE_PROVIDER_SITE_OTHER): Payer: PPO

## 2019-06-27 DIAGNOSIS — Z0000 Encounter for general adult medical examination without abnormal findings: Secondary | ICD-10-CM

## 2019-06-27 NOTE — Patient Instructions (Signed)
Becky Prince , Thank you for taking time to come for your Medicare Wellness Visit. I appreciate your ongoing commitment to your health goals. Please review the following plan we discussed and let me know if I can assist you in the future.   Screening recommendations/referrals: Colonoscopy: No longer required.  Mammogram: No longer required.  Bone Density: Ordered today. Pt aware the office will call re: appt. Recommended yearly ophthalmology/optometry visit for glaucoma screening and checkup Recommended yearly dental visit for hygiene and checkup  Vaccinations: Influenza vaccine: Up to date Pneumococcal vaccine: Completed series Tdap vaccine: Up to date, due 12/2020 Shingles vaccine: Pt declines today.     Advanced directives: Please bring a copy of your POA (Power of Attorney) and/or Living Will to your next appointment.   Conditions/risks identified: Recommend increasing water intake to 6-8 8 oz glasses a day.  Next appointment: None- declined scheduling a follow up with PCP or an AWV for 2022 at this time.   Preventive Care 20 Years and Older, Female Preventive care refers to lifestyle choices and visits with your health care provider that can promote health and wellness. What does preventive care include?  A yearly physical exam. This is also called an annual well check.  Dental exams once or twice a year.  Routine eye exams. Ask your health care provider how often you should have your eyes checked.  Personal lifestyle choices, including:  Daily care of your teeth and gums.  Regular physical activity.  Eating a healthy diet.  Avoiding tobacco and drug use.  Limiting alcohol use.  Practicing safe sex.  Taking low-dose aspirin every day.  Taking vitamin and mineral supplements as recommended by your health care provider. What happens during an annual well check? The services and screenings done by your health care provider during your annual well check will depend on  your age, overall health, lifestyle risk factors, and family history of disease. Counseling  Your health care provider may ask you questions about your:  Alcohol use.  Tobacco use.  Drug use.  Emotional well-being.  Home and relationship well-being.  Sexual activity.  Eating habits.  History of falls.  Memory and ability to understand (cognition).  Work and work Statistician.  Reproductive health. Screening  You may have the following tests or measurements:  Height, weight, and BMI.  Blood pressure.  Lipid and cholesterol levels. These may be checked every 5 years, or more frequently if you are over 10 years old.  Skin check.  Lung cancer screening. You may have this screening every year starting at age 24 if you have a 30-pack-year history of smoking and currently smoke or have quit within the past 15 years.  Fecal occult blood test (FOBT) of the stool. You may have this test every year starting at age 66.  Flexible sigmoidoscopy or colonoscopy. You may have a sigmoidoscopy every 5 years or a colonoscopy every 10 years starting at age 14.  Hepatitis C blood test.  Hepatitis B blood test.  Sexually transmitted disease (STD) testing.  Diabetes screening. This is done by checking your blood sugar (glucose) after you have not eaten for a while (fasting). You may have this done every 1-3 years.  Bone density scan. This is done to screen for osteoporosis. You may have this done starting at age 16.  Mammogram. This may be done every 1-2 years. Talk to your health care provider about how often you should have regular mammograms. Talk with your health care provider about your  test results, treatment options, and if necessary, the need for more tests. Vaccines  Your health care provider may recommend certain vaccines, such as:  Influenza vaccine. This is recommended every year.  Tetanus, diphtheria, and acellular pertussis (Tdap, Td) vaccine. You may need a Td booster  every 10 years.  Zoster vaccine. You may need this after age 80.  Pneumococcal 13-valent conjugate (PCV13) vaccine. One dose is recommended after age 24.  Pneumococcal polysaccharide (PPSV23) vaccine. One dose is recommended after age 13. Talk to your health care provider about which screenings and vaccines you need and how often you need them. This information is not intended to replace advice given to you by your health care provider. Make sure you discuss any questions you have with your health care provider. Document Released: 05/10/2015 Document Revised: 01/01/2016 Document Reviewed: 02/12/2015 Elsevier Interactive Patient Education  2017 [employer] Prevention in the Home Falls can cause injuries. They can happen to people of all ages. There are many things you can do to make your home safe and to help prevent falls. What can I do on the outside of my home?  Regularly fix the edges of walkways and driveways and fix any cracks.  Remove anything that might make you trip as you walk through a door, such as a raised step or threshold.  Trim any bushes or trees on the path to your home.  Use bright outdoor lighting.  Clear any walking paths of anything that might make someone trip, such as rocks or tools.  Regularly check to see if handrails are loose or broken. Make sure that both sides of any steps have handrails.  Any raised decks and porches should have guardrails on the edges.  Have any leaves, snow, or ice cleared regularly.  Use sand or salt on walking paths during winter.  Clean up any spills in your garage right away. This includes oil or grease spills. What can I do in the bathroom?  Use night lights.  Install grab bars by the toilet and in the tub and shower. Do not use towel bars as grab bars.  Use non-skid mats or decals in the tub or shower.  If you need to sit down in the shower, use a plastic, non-slip stool.  Keep the floor dry. Clean up any  water that spills on the floor as soon as it happens.  Remove soap buildup in the tub or shower regularly.  Attach bath mats securely with double-sided non-slip rug tape.  Do not have throw rugs and other things on the floor that can make you trip. What can I do in the bedroom?  Use night lights.  Make sure that you have a light by your bed that is easy to reach.  Do not use any sheets or blankets that are too big for your bed. They should not hang down onto the floor.  Have a firm chair that has side arms. You can use this for support while you get dressed.  Do not have throw rugs and other things on the floor that can make you trip. What can I do in the kitchen?  Clean up any spills right away.  Avoid walking on wet floors.  Keep items that you use a lot in easy-to-reach places.  If you need to reach something above you, use a strong step stool that has a grab bar.  Keep electrical cords out of the way.  Do not use floor polish or wax that  makes floors slippery. If you must use wax, use non-skid floor wax.  Do not have throw rugs and other things on the floor that can make you trip. What can I do with my stairs?  Do not leave any items on the stairs.  Make sure that there are handrails on both sides of the stairs and use them. Fix handrails that are broken or loose. Make sure that handrails are as long as the stairways.  Check any carpeting to make sure that it is firmly attached to the stairs. Fix any carpet that is loose or worn.  Avoid having throw rugs at the top or bottom of the stairs. If you do have throw rugs, attach them to the floor with carpet tape.  Make sure that you have a light switch at the top of the stairs and the bottom of the stairs. If you do not have them, ask someone to add them for you. What else can I do to help prevent falls?  Wear shoes that:  Do not have high heels.  Have rubber bottoms.  Are comfortable and fit you well.  Are closed  at the toe. Do not wear sandals.  If you use a stepladder:  Make sure that it is fully opened. Do not climb a closed stepladder.  Make sure that both sides of the stepladder are locked into place.  Ask someone to hold it for you, if possible.  Clearly mark and make sure that you can see:  Any grab bars or handrails.  First and last steps.  Where the edge of each step is.  Use tools that help you move around (mobility aids) if they are needed. These include:  Canes.  Walkers.  Scooters.  Crutches.  Turn on the lights when you go into a dark area. Replace any light bulbs as soon as they burn out.  Set up your furniture so you have a clear path. Avoid moving your furniture around.  If any of your floors are uneven, fix them.  If there are any pets around you, be aware of where they are.  Review your medicines with your doctor. Some medicines can make you feel dizzy. This can increase your chance of falling. Ask your doctor what other things that you can do to help prevent falls. This information is not intended to replace advice given to you by your health care provider. Make sure you discuss any questions you have with your health care provider. Document Released: 02/07/2009 Document Revised: 09/19/2015 Document Reviewed: 05/18/2014 Elsevier Interactive Patient Education  2017 [employer].

## 2019-06-28 ENCOUNTER — Other Ambulatory Visit: Payer: Self-pay | Admitting: Family Medicine

## 2019-07-21 ENCOUNTER — Other Ambulatory Visit: Payer: Self-pay | Admitting: Family Medicine

## 2019-08-10 DIAGNOSIS — M5416 Radiculopathy, lumbar region: Secondary | ICD-10-CM | POA: Diagnosis not present

## 2019-08-10 DIAGNOSIS — M5136 Other intervertebral disc degeneration, lumbar region: Secondary | ICD-10-CM | POA: Diagnosis not present

## 2019-08-10 DIAGNOSIS — M48062 Spinal stenosis, lumbar region with neurogenic claudication: Secondary | ICD-10-CM | POA: Diagnosis not present

## 2019-08-24 ENCOUNTER — Encounter: Payer: Self-pay | Admitting: Family Medicine

## 2019-08-24 ENCOUNTER — Ambulatory Visit (INDEPENDENT_AMBULATORY_CARE_PROVIDER_SITE_OTHER): Payer: PPO | Admitting: Family Medicine

## 2019-08-24 ENCOUNTER — Other Ambulatory Visit: Payer: Self-pay

## 2019-08-24 VITALS — BP 122/66 | HR 63 | Temp 96.9°F | Wt 130.0 lb

## 2019-08-24 DIAGNOSIS — E7800 Pure hypercholesterolemia, unspecified: Secondary | ICD-10-CM | POA: Diagnosis not present

## 2019-08-24 DIAGNOSIS — R61 Generalized hyperhidrosis: Secondary | ICD-10-CM | POA: Diagnosis not present

## 2019-08-24 DIAGNOSIS — F0150 Vascular dementia without behavioral disturbance: Secondary | ICD-10-CM

## 2019-08-24 DIAGNOSIS — I10 Essential (primary) hypertension: Secondary | ICD-10-CM

## 2019-08-24 DIAGNOSIS — G309 Alzheimer's disease, unspecified: Secondary | ICD-10-CM

## 2019-08-24 DIAGNOSIS — F0280 Dementia in other diseases classified elsewhere without behavioral disturbance: Secondary | ICD-10-CM | POA: Diagnosis not present

## 2019-08-24 NOTE — Assessment & Plan Note (Signed)
Chronic and stable Continue aricept

## 2019-08-24 NOTE — Assessment & Plan Note (Signed)
Well controlled Continue current medications Recheck metabolic panel F/u in 6 months  

## 2019-08-24 NOTE — Assessment & Plan Note (Addendum)
Previously well controlled Continue statin Repeat FLP and CMP Goal LDL < 100

## 2019-08-24 NOTE — Progress Notes (Signed)
I,Laura E Walsh,acting as a scribe for [name], MD.,have documented all relevant documentation on the behalf of [name], MD,as directed by  [name], MD while in the presence of [name], MD.   Established patient visit   Patient: Becky Prince   DOB: Aug 20, 1925   86 y.o. Female  MRN: [id number] Visit Date: 08/24/2019  Today's healthcare provider: [name], MD   Chief Complaint  Patient presents with  . Hypertension  . Hyperlipidemia   Subjective    HPI Hypertension, follow-up  BP Readings from Last 3 Encounters:  08/24/19 122/66  02/13/19 (!) 144/70  09/15/18 139/75   Wt Readings from Last 3 Encounters:  08/24/19 130 lb (59 kg)  02/13/19 122 lb (55.3 kg)  03/17/18 120 lb (54.4 kg)     She was last seen for hypertension 6 months ago.  BP at that visit was elevated. Management since that visit includes medications.  She reports excellent compliance with treatment. She is not having side effects.  She is following a Low Sodium diet. She is exercising. She does not smoke.  Use of agents associated with hypertension: none.   Outside blood pressures are normal at home. Symptoms: No chest pain No chest pressure No palpitations No dyspnea No orthopnea No paroxysmal nocturnal dyspnea No lower extremity edema No syncope   Pertinent labs: Lab Results  Component Value Date   CHOL 135 03/17/2018   HDL 62 03/17/2018   LDLCALC 54 03/17/2018   TRIG 95 03/17/2018   CHOLHDL 2.2 03/17/2018   Lab Results  Component Value Date   NA 137 03/31/2019   K 4.8 03/31/2019   CO2 26 03/31/2019   GLUCOSE 116 (H) 03/31/2019   BUN 38 (H) 03/31/2019   CREATININE 1.71 (H) 03/31/2019   CALCIUM 9.3 03/31/2019   GFRNONAA 26 (L) 03/31/2019   GFRAA 30 (L) 03/31/2019     The ASCVD Risk score (Goff DC Jr., et al., 2013) failed to calculate for the following reasons:   The 2013 ASCVD risk score is only valid for ages 71 to 96    --------------------------------------------------------------------------------------------------- Lipid/Cholesterol, Follow-up  Last lipid panel Other pertinent labs  Lab Results  Component Value Date   CHOL 135 03/17/2018   HDL 62 03/17/2018   LDLCALC 54 03/17/2018   TRIG 95 03/17/2018   CHOLHDL 2.2 03/17/2018   Lab Results  Component Value Date   ALT 18 03/31/2019   AST 24 03/31/2019   PLT 243 03/31/2019   TSH 0.774 03/31/2019     She was last seen for this 6 months ago.  Management since that visit includes no changes.  She reports excellent compliance with treatment. She is not having side effects.  Symptoms: No chest pain No chest pressure/discomfort No dyspnea No lower extremity edema No numbness or tingling of extremity No orthopnea No palpitations No paroxysmal nocturnal dyspnea No speech difficulty No syncope  Current diet: in general, a "healthy" diet    Wt Readings from Last 3 Encounters:  08/24/19 130 lb (59 kg)  02/13/19 122 lb (55.3 kg)  03/17/18 120 lb (54.4 kg)   The ASCVD Risk score Mikey Bussing DC Jr., et al., 2013) failed to calculate for the following reasons:   The 2013 ASCVD risk score is only valid for ages 45 to 19  -----------------------------------------------------------------------------------------   Patient Active Problem List   Diagnosis Date Noted  . Adjustment disorder 02/13/2019  . Night sweats 02/13/2019  . 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 Dupuytren's  contracture of both hands 03/17/2018  . Cerebrovascular disease 10/06/2016  . Multinodular goiter 10/06/2016  . TIA (transient ischemic attack) 09/28/2016  . Osteoporosis 10/14/2015  . Constipation 04/12/2015  . Contracture of finger joint 04/12/2015  . Hypercholesteremia 04/12/2015  . BP (high blood pressure) 04/12/2015  . Allergic rhinitis, seasonal 04/12/2015  . Subclinical hyperthyroidism 11/16/2014   Social History   Tobacco Use  . Smoking status: Never Smoker   . Smokeless tobacco: Never Used  Substance Use Topics  . Alcohol use: No    Alcohol/week: 0.0 standard drinks  . Drug use: No   Allergies  Allergen Reactions  . Naproxen Swelling    Had lip swelling 20 yrs ago - not sure if caused by oxycodone or naproxen  . Oxycodone-Aspirin Swelling    Had lip swelling 20 yrs ago. Not sure if caused by oxycodone or naproxen.  . Prednisone Diarrhea       Medications: Outpatient Medications Prior to Visit  Medication Sig  . amLODipine (NORVASC) 5 MG tablet Take 1 tablet (5 mg total) by mouth daily.  . ASPIRIN 81 PO TAKE 1 TABLET BY MOUTH EVERY DAY  . atorvastatin (LIPITOR) 40 MG tablet TAKE 1 TABLET BY MOUTH DAILY AT 6 PM.  . citalopram (CELEXA) 10 MG tablet TAKE 1 TABLET BY MOUTH EVERY DAY  . donepezil (ARICEPT) 10 MG tablet Take 1 tablet (10 mg total) by mouth at bedtime.  . fluticasone (FLONASE) 50 MCG/ACT nasal spray Place 1 spray into both nostrils daily.  . mirtazapine (REMERON) 15 MG tablet TAKE 1 TABLET BY MOUTH EVERYDAY AT BEDTIME  . Multiple Vitamin (MULTI-VITAMIN) tablet Take by mouth.  . spironolactone (ALDACTONE) 25 MG tablet Take 1 tablet (25 mg total) by mouth daily.  . traMADol (ULTRAM) 50 MG tablet TAKE 1 TABLET BY MOUTH FOUR TIMES A DAY AS NEEDED  . [DISCONTINUED] meloxicam (MOBIC) 7.5 MG tablet TAKE 1 TABLET BY MOUTH EVERY DAY   No facility-administered medications prior to visit.    Review of Systems  Constitutional: Positive for diaphoresis (Pt reports still having trouble with night sweats. ). Negative for activity change, appetite change, chills, fatigue, fever and unexpected weight change.  Respiratory: Negative.   Cardiovascular: Negative.   Gastrointestinal: Negative.   Musculoskeletal: Negative.   Neurological: Negative for dizziness, light-headedness and headaches.    Last CBC Lab Results  Component Value Date   WBC 6.9 03/31/2019   HGB 13.9 03/31/2019   HCT 43.2 03/31/2019   MCV 98.9 03/31/2019   MCH  31.8 03/31/2019   RDW 12.2 03/31/2019   PLT 243 65/78/4696   Last metabolic panel Lab Results  Component Value Date   GLUCOSE 116 (H) 03/31/2019   NA 137 03/31/2019   K 4.8 03/31/2019   CL 102 03/31/2019   CO2 26 03/31/2019   BUN 38 (H) 03/31/2019   CREATININE 1.71 (H) 03/31/2019   GFRNONAA 26 (L) 03/31/2019   GFRAA 30 (L) 03/31/2019   CALCIUM 9.3 03/31/2019   PHOS 3.6 04/28/2018   PROT 7.6 03/31/2019   ALBUMIN 4.4 03/31/2019   LABGLOB 3.0 03/31/2019   AGRATIO 1.8 03/17/2018   BILITOT 0.7 03/31/2019   ALKPHOS 109 03/31/2019   AST 24 03/31/2019   ALT 18 03/31/2019   ANIONGAP 9 03/31/2019   Last lipids Lab Results  Component Value Date   CHOL 135 03/17/2018   HDL 62 03/17/2018   LDLCALC 54 03/17/2018   TRIG 95 03/17/2018   CHOLHDL 2.2 03/17/2018   Last hemoglobin A1c  Lab Results  Component Value Date   HGBA1C 5.1 09/28/2016   Last thyroid functions Lab Results  Component Value Date   TSH 0.774 03/31/2019   T4TOTAL 7.6 03/31/2019      Objective    BP 122/66 (BP Location: Left Arm, Patient Position: Sitting, Cuff Size: Normal)   Pulse 63   Temp (!) 96.9 F (36.1 C) (Temporal)   Wt 130 lb (59 kg)   SpO2 95%   BMI 19.77 kg/m  BP Readings from Last 3 Encounters:  08/24/19 122/66  02/13/19 (!) 144/70  09/15/18 139/75      Physical Exam Vitals and nursing note reviewed.  Constitutional:      General: She is not in acute distress.    Appearance: Normal appearance. She is not diaphoretic.  HENT:     Head: Normocephalic and atraumatic.  Eyes:     Conjunctiva/sclera: Conjunctivae normal.  Cardiovascular:     Rate and Rhythm: Normal rate and regular rhythm.     Heart sounds: Normal heart sounds.  Pulmonary:     Effort: Pulmonary effort is normal. No respiratory distress.     Breath sounds: Normal breath sounds. No wheezing.  Abdominal:     General: There is no distension.     Palpations: Abdomen is soft.     Tenderness: There is no abdominal  tenderness.  Musculoskeletal:     Right lower leg: No edema.     Left lower leg: No edema.  Lymphadenopathy:     Cervical: No cervical adenopathy.  Skin:    General: Skin is warm and dry.  Neurological:     Mental Status: She is alert and oriented to person, place, and time. Mental status is at baseline.  Psychiatric:        Mood and Affect: Mood normal.        Behavior: Behavior normal.        Thought Content: Thought content normal.        Judgment: Judgment normal.      No results found for any visits on 08/24/19.  Assessment & Plan     Problem List Items Addressed This Visit      Cardiovascular and Mediastinum   BP (high blood pressure) - Primary    Well controlled Continue current medications Recheck metabolic panel F/u in 6 months       Relevant Orders   Comprehensive metabolic panel   Mixed dementia ([place])    Chronic and stable Continue aricept        Other   Hypercholesteremia    Previously well controlled Continue statin Repeat FLP and CMP Goal LDL < 100      Relevant Orders   Comprehensive metabolic panel   Lipid panel   Night sweats    Chronic but stable. Has been evaluated by oncology.  Will continue to monitor.             Return in about 6 months (around 02/23/2020) for chronic disease f/u.      I, [name], MD, have reviewed all documentation for this visit. The documentation on 08/24/19 for the exam, diagnosis, procedures, and orders are all accurate and complete.   Hassen Bruun, [name], MD, MPH [facility]

## 2019-08-24 NOTE — Assessment & Plan Note (Signed)
Chronic but stable. Has been evaluated by oncology.  Will continue to monitor.

## 2019-08-25 ENCOUNTER — Telehealth: Payer: Self-pay

## 2019-08-25 LAB — LIPID PANEL
Chol/HDL Ratio: 2.4 ratio (ref 0.0–4.4)
Cholesterol, Total: 145 mg/dL (ref 100–199)
HDL: 61 mg/dL (ref >39)
LDL Chol Calc (NIH): 61 mg/dL (ref 0–99)
Triglycerides: 130 mg/dL (ref 0–149)
VLDL Cholesterol Cal: 23 mg/dL (ref 5–40)

## 2019-08-25 LAB — COMPREHENSIVE METABOLIC PANEL
ALT: 10 IU/L (ref 0–32)
AST: 21 IU/L (ref 0–40)
Albumin/Globulin Ratio: 1.8 (ref 1.2–2.2)
Albumin: 4.5 g/dL (ref 3.5–4.6)
Alkaline Phosphatase: 119 IU/L — ABNORMAL HIGH (ref 39–117)
BUN/Creatinine Ratio: 16 (ref 12–28)
BUN: 23 mg/dL (ref 10–36)
Bilirubin Total: 0.4 mg/dL (ref 0.0–1.2)
CO2: 22 mmol/L (ref 20–29)
Calcium: 9.7 mg/dL (ref 8.7–10.3)
Chloride: 103 mmol/L (ref 96–106)
Creatinine, Ser: 1.42 mg/dL — ABNORMAL HIGH (ref 0.57–1.00)
GFR calc Af Amer: 37 mL/min/{1.73_m2} — ABNORMAL LOW (ref >59)
GFR calc non Af Amer: 32 mL/min/{1.73_m2} — ABNORMAL LOW (ref >59)
Globulin, Total: 2.5 g/dL (ref 1.5–4.5)
Glucose: 74 mg/dL (ref 65–99)
Potassium: 4.7 mmol/L (ref 3.5–5.2)
Sodium: 140 mmol/L (ref 134–144)
Total Protein: 7 g/dL (ref 6.0–8.5)

## 2019-08-25 NOTE — Telephone Encounter (Signed)
-----   Message from Virginia Crews, MD sent at 08/25/2019 10:11 AM EDT ----- Normal/stable labs

## 2019-08-25 NOTE — Telephone Encounter (Signed)
Patient advised as below.  

## 2019-09-11 ENCOUNTER — Other Ambulatory Visit: Payer: Self-pay | Admitting: Family Medicine

## 2019-09-14 ENCOUNTER — Other Ambulatory Visit: Payer: Self-pay | Admitting: Family Medicine

## 2019-09-26 DIAGNOSIS — N184 Chronic kidney disease, stage 4 (severe): Secondary | ICD-10-CM | POA: Diagnosis not present

## 2019-09-26 DIAGNOSIS — I10 Essential (primary) hypertension: Secondary | ICD-10-CM | POA: Diagnosis not present

## 2019-10-03 ENCOUNTER — Emergency Department: Payer: PPO

## 2019-10-03 ENCOUNTER — Other Ambulatory Visit: Payer: Self-pay

## 2019-10-03 ENCOUNTER — Observation Stay
Admission: EM | Admit: 2019-10-03 | Discharge: 2019-10-06 | Disposition: A | Discharge status: Skilled Nursing Facility | Payer: PPO | Attending: Hospitalist | Admitting: Hospitalist

## 2019-10-03 ENCOUNTER — Encounter: Payer: Self-pay | Admitting: Intensive Care

## 2019-10-03 DIAGNOSIS — I10 Essential (primary) hypertension: Secondary | ICD-10-CM | POA: Insufficient documentation

## 2019-10-03 DIAGNOSIS — Z79899 Other long term (current) drug therapy: Secondary | ICD-10-CM | POA: Insufficient documentation

## 2019-10-03 DIAGNOSIS — I517 Cardiomegaly: Secondary | ICD-10-CM | POA: Diagnosis not present

## 2019-10-03 DIAGNOSIS — S32592A Other specified fracture of left pubis, initial encounter for closed fracture: Secondary | ICD-10-CM | POA: Diagnosis not present

## 2019-10-03 DIAGNOSIS — Z888 Allergy status to other drugs, medicaments and biological substances status: Secondary | ICD-10-CM | POA: Diagnosis not present

## 2019-10-03 DIAGNOSIS — S32591A Other specified fracture of right pubis, initial encounter for closed fracture: Secondary | ICD-10-CM | POA: Diagnosis present

## 2019-10-03 DIAGNOSIS — Z8673 Personal history of transient ischemic attack (TIA), and cerebral infarction without residual deficits: Secondary | ICD-10-CM | POA: Diagnosis not present

## 2019-10-03 DIAGNOSIS — S32512A Fracture of superior rim of left pubis, initial encounter for closed fracture: Principal | ICD-10-CM | POA: Insufficient documentation

## 2019-10-03 DIAGNOSIS — F0390 Unspecified dementia without behavioral disturbance: Secondary | ICD-10-CM | POA: Diagnosis not present

## 2019-10-03 DIAGNOSIS — W010XXA Fall on same level from slipping, tripping and stumbling without subsequent striking against object, initial encounter: Secondary | ICD-10-CM | POA: Insufficient documentation

## 2019-10-03 DIAGNOSIS — Z01818 Encounter for other preprocedural examination: Secondary | ICD-10-CM | POA: Diagnosis not present

## 2019-10-03 DIAGNOSIS — E7800 Pure hypercholesterolemia, unspecified: Secondary | ICD-10-CM | POA: Insufficient documentation

## 2019-10-03 DIAGNOSIS — Z885 Allergy status to narcotic agent status: Secondary | ICD-10-CM | POA: Insufficient documentation

## 2019-10-03 DIAGNOSIS — S32120A Nondisplaced Zone II fracture of sacrum, initial encounter for closed fracture: Secondary | ICD-10-CM | POA: Insufficient documentation

## 2019-10-03 DIAGNOSIS — Z7982 Long term (current) use of aspirin: Secondary | ICD-10-CM | POA: Diagnosis not present

## 2019-10-03 DIAGNOSIS — J939 Pneumothorax, unspecified: Secondary | ICD-10-CM | POA: Diagnosis not present

## 2019-10-03 DIAGNOSIS — Z886 Allergy status to analgesic agent status: Secondary | ICD-10-CM | POA: Diagnosis not present

## 2019-10-03 DIAGNOSIS — M810 Age-related osteoporosis without current pathological fracture: Secondary | ICD-10-CM | POA: Insufficient documentation

## 2019-10-03 DIAGNOSIS — F329 Major depressive disorder, single episode, unspecified: Secondary | ICD-10-CM | POA: Diagnosis not present

## 2019-10-03 DIAGNOSIS — W19XXXA Unspecified fall, initial encounter: Secondary | ICD-10-CM | POA: Diagnosis not present

## 2019-10-03 DIAGNOSIS — Z20822 Contact with and (suspected) exposure to covid-19: Secondary | ICD-10-CM | POA: Insufficient documentation

## 2019-10-03 DIAGNOSIS — Z03818 Encounter for observation for suspected exposure to other biological agents ruled out: Secondary | ICD-10-CM | POA: Diagnosis not present

## 2019-10-03 DIAGNOSIS — M25552 Pain in left hip: Secondary | ICD-10-CM | POA: Diagnosis present

## 2019-10-03 LAB — COMPREHENSIVE METABOLIC PANEL
ALT: 18 U/L (ref 0–44)
AST: 24 U/L (ref 15–41)
Albumin: 4 g/dL (ref 3.5–5.0)
Alkaline Phosphatase: 119 U/L (ref 38–126)
Anion gap: 8 (ref 5–15)
BUN: 28 mg/dL — ABNORMAL HIGH (ref 8–23)
CO2: 28 mmol/L (ref 22–32)
Calcium: 8.7 mg/dL — ABNORMAL LOW (ref 8.9–10.3)
Chloride: 102 mmol/L (ref 98–111)
Creatinine, Ser: 1.47 mg/dL — ABNORMAL HIGH (ref 0.44–1.00)
GFR calc Af Amer: 36 mL/min — ABNORMAL LOW (ref >60)
GFR calc non Af Amer: 31 mL/min — ABNORMAL LOW (ref >60)
Glucose, Bld: 108 mg/dL — ABNORMAL HIGH (ref 70–99)
Potassium: 4.7 mmol/L (ref 3.5–5.1)
Sodium: 138 mmol/L (ref 135–145)
Total Bilirubin: 0.8 mg/dL (ref 0.3–1.2)
Total Protein: 7.3 g/dL (ref 6.5–8.1)

## 2019-10-03 LAB — CBC WITH DIFFERENTIAL/PLATELET
Abs Immature Granulocytes: 0.09 10*3/uL — ABNORMAL HIGH (ref 0.00–0.07)
Basophils Absolute: 0 10*3/uL (ref 0.0–0.1)
Basophils Relative: 0 %
Eosinophils Absolute: 0.1 10*3/uL (ref 0.0–0.5)
Eosinophils Relative: 1 %
HCT: 42.2 % (ref 36.0–46.0)
Hemoglobin: 13.7 g/dL (ref 12.0–15.0)
Immature Granulocytes: 1 %
Lymphocytes Relative: 17 %
Lymphs Abs: 1.9 10*3/uL (ref 0.7–4.0)
MCH: 31.8 pg (ref 26.0–34.0)
MCHC: 32.5 g/dL (ref 30.0–36.0)
MCV: 97.9 fL (ref 80.0–100.0)
Monocytes Absolute: 0.7 10*3/uL (ref 0.1–1.0)
Monocytes Relative: 6 %
Neutro Abs: 8.4 10*3/uL — ABNORMAL HIGH (ref 1.7–7.7)
Neutrophils Relative %: 75 %
Platelets: 238 10*3/uL (ref 150–400)
RBC: 4.31 MIL/uL (ref 3.87–5.11)
RDW: 11.9 % (ref 11.5–15.5)
WBC: 11.2 10*3/uL — ABNORMAL HIGH (ref 4.0–10.5)
nRBC: 0 % (ref 0.0–0.2)

## 2019-10-03 LAB — URINALYSIS, COMPLETE (UACMP) WITH MICROSCOPIC
Bacteria, UA: NONE SEEN
Bilirubin Urine: NEGATIVE
Glucose, UA: NEGATIVE mg/dL
Hgb urine dipstick: NEGATIVE
Ketones, ur: NEGATIVE mg/dL
Leukocytes,Ua: NEGATIVE
Nitrite: NEGATIVE
Protein, ur: NEGATIVE mg/dL
Specific Gravity, Urine: 1.023 (ref 1.005–1.030)
Squamous Epithelial / HPF: NONE SEEN (ref 0–5)
pH: 7 (ref 5.0–8.0)

## 2019-10-03 LAB — TYPE AND SCREEN
ABO/RH(D): O POS
Antibody Screen: NEGATIVE

## 2019-10-03 LAB — SARS CORONAVIRUS 2 BY RT PCR (HOSPITAL ORDER, PERFORMED IN ~~LOC~~ HOSPITAL LAB): SARS Coronavirus 2: NEGATIVE

## 2019-10-03 MED ORDER — IOHEXOL 300 MG/ML  SOLN
75.0000 mL | Freq: Once | INTRAMUSCULAR | Status: AC | PRN
  Administered 2019-10-03: 75 mL via INTRAVENOUS

## 2019-10-03 NOTE — ED Provider Notes (Signed)
[facility] Emergency Department Provider Note  ____________________________________________   First MD Initiated Contact with Patient 10/03/19 1923     (approximate)  I have reviewed the triage vital signs and the nursing notes.  History  Chief Complaint Hip Pain (left)    HPI Becky Prince is a 86 y.o. female past medical history as below who presents to the emergency department for a non-syncopal, mechanical fall with resultant left hip pain.  Patient states she slipped on the hardwood floor, causing her to fall.  She landed on her left hip.  Denies hitting her head or chest.  No loss of consciousness.  Unable to bear weight after the fall.  Reports acute, sharp pain located to the left hip and pelvis area.  10/10 in severity.  No radiation.  Worsened with movement, improved with laying still.  Denies any prior injury to the hip.  Not on any blood thinners.  Denies any pain elsewhere, no head pain, neck pain, back pain, chest pain, abdominal pain, or other extremity pain.   Past Medical Hx Past Medical History:  Diagnosis Date  . Depression   . Environmental allergies   . Hypercholesteremia   . Hypertension     Problem List Patient Active Problem List   Diagnosis Date Noted  . Adjustment disorder 02/13/2019  . Night sweats 02/13/2019  . 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 Dupuytren's contracture of both hands 03/17/2018  . Cerebrovascular disease 10/06/2016  . Multinodular goiter 10/06/2016  . TIA (transient ischemic attack) 09/28/2016  . Osteoporosis 10/14/2015  . Constipation 04/12/2015  . Contracture of finger joint 04/12/2015  . Hypercholesteremia 04/12/2015  . BP (high blood pressure) 04/12/2015  . Allergic rhinitis, seasonal 04/12/2015  . Subclinical hyperthyroidism 11/16/2014    Past Surgical Hx Past Surgical History:  Procedure Laterality Date  . ABDOMINAL HYSTERECTOMY    . CATARACT EXTRACTION W/PHACO Right 06/12/2015    Procedure: CATARACT EXTRACTION PHACO AND INTRAOCULAR LENS PLACEMENT (IOC);  Surgeon: Leandrew Koyanagi, MD;  Location: [facility];  Service: Ophthalmology;  Laterality: Right;    Medications Prior to Admission medications   Medication Sig Start Date End Date Taking? Authorizing Provider  amLODipine (NORVASC) 5 MG tablet Take 1 tablet (5 mg total) by mouth daily. 02/22/19   Virginia Crews, MD  ASPIRIN 81 PO TAKE 1 TABLET BY MOUTH EVERY DAY 12/21/18   [provider]  atorvastatin (LIPITOR) 40 MG tablet TAKE 1 TABLET BY MOUTH DAILY AT 6 PM. 09/14/19   Bacigalupo, [name], MD  citalopram (CELEXA) 10 MG tablet TAKE 1 TABLET BY MOUTH EVERY DAY 03/08/19   Virginia Crews, MD  donepezil (ARICEPT) 10 MG tablet TAKE 1 TABLET BY MOUTH EVERYDAY AT BEDTIME 09/11/19   Bacigalupo, [name], MD  fluticasone (FLONASE) 50 MCG/ACT nasal spray Place 1 spray into both nostrils daily. 04/17/19   Virginia Crews, MD  mirtazapine (REMERON) 15 MG tablet TAKE 1 TABLET BY MOUTH EVERYDAY AT BEDTIME 07/21/19   Steele Sizer, MD  Multiple Vitamin (MULTI-VITAMIN) tablet Take by mouth.    [provider]  spironolactone (ALDACTONE) 25 MG tablet TAKE 1 TABLET BY MOUTH EVERY DAY 09/14/19   Bacigalupo, [name], MD  traMADol (ULTRAM) 50 MG tablet TAKE 1 TABLET BY MOUTH FOUR TIMES A DAY AS NEEDED 01/01/19   [provider]    Allergies Naproxen, Oxycodone-aspirin, and Prednisone  Family Hx Family History  Problem Relation Age of Onset  . Heart disease Mother  CHF  . Hypertension Mother   . Cancer Father        prostate  . Cancer Sister        breast  . Kidney disease Brother   . Breast cancer Neg Hx     Social Hx Social History   Tobacco Use  . Smoking status: Never Smoker  . Smokeless tobacco: Never Used  Substance Use Topics  . Alcohol use: No    Alcohol/week: 0.0 standard drinks  . Drug use: No     Review of Systems  Constitutional: Negative  for fever. Negative for chills. + fall Eyes: Negative for visual changes. ENT: Negative for sore throat. Cardiovascular: Negative for chest pain. Respiratory: Negative for shortness of breath. Gastrointestinal: Negative for nausea. Negative for vomiting.  Genitourinary: Negative for dysuria. Musculoskeletal: Negative for leg swelling. + left hip pain Skin: Negative for rash. Neurological: Negative for headaches.   Physical Exam  Vital Signs: ED Triage Vitals  Enc Vitals Group     BP 10/03/19 1821 (!) 155/56     Pulse Rate 10/03/19 1821 61     Resp 10/03/19 1821 16     Temp 10/03/19 1821 98.4 F (36.9 C)     Temp Source 10/03/19 1821 Oral     SpO2 10/03/19 1821 93 %     Weight 10/03/19 1822 132 lb (59.9 kg)     Height 10/03/19 1822 5\' 9"  (1.753 m)     Head Circumference --      Peak Flow --      Pain Score 10/03/19 1821 10     Pain Loc --      Pain Edu? --      Excl. in [place]? --     Constitutional: Alert and oriented. Appears younger than age. NAD.  Head: Normocephalic. Atraumatic. Eyes: Conjunctivae clear. Sclera anicteric. Pupils equal and symmetric. Nose: No masses or lesions. No congestion or rhinorrhea. Mouth/Throat: Wearing mask.  Neck: No stridor. Trachea midline. No midline CS tenderness.  Cardiovascular: Normal rate, regular rhythm. Extremities well perfused. Respiratory: Normal respiratory effort.  Lungs CTAB.  Chest wall is stable, nontender, no palpable step-offs or crepitance. Gastrointestinal: Soft. Non-distended. Non-tender.  Genitourinary: Deferred. Musculoskeletal: Guarding at the left hip.  ROM deferred secondary to known injury.  Otherwise FROM to bilateral shoulders, elbows, wrists, RIGHT hip/knee/ankle. NT thru palpation of BUE and RLE. Neurologic:  Normal speech and language. No gross focal or lateralizing neurologic deficits are appreciated.  Skin: Skin is warm, dry and intact. No rash noted.  No lacerations, abrasions, ecchymosis. Psychiatric: Mood  and affect are appropriate for situation.  EKG  Personally reviewed and interpreted by myself.   Date: 10/03/19 Time: 2033 Rate: 86 Rhythm: sinus Axis: left Intervals: prolonged PR No STEMI    Radiology  Personally reviewed available imaging myself.   XR LEFT hip - IMPRESSION:  Acute left superior and inferior pubic ramus fractures. Negative for  hip fracture.   CT A/P - IMPRESSION: 1. Left superior and inferior pubic rami fractures, mildly displaced and comminuted. No associated pelvic hematoma. 2. Nondisplaced zone 2 sacral fracture. 3. No additional acute traumatic injury to the abdomen or pelvis.     Procedures  Procedure(s) performed (including critical care):  Procedures   Initial Impression / Assessment and Plan / MDM / ED Course  86 y.o. female who presents to the ED for a fall with resultant left hip, pelvic pain  Ddx: contusion, fracture, dislocation  Will plan for labs, imaging  XR  reveals left superior and inferior pubic rami fractures. Will obtain CT to rule out any related bleeding, though low suspicion as she is otherwise hemodynamically stable. Not on thinners. Discussed XR findings with the patient. Also discussed XR findings with Dr. Mack Guise of orthopedics, no acute interventions recommended from orthopedic perspective, agree with admission to medicine for pain control, PT/OT.  No pelvic hematoma seen on CT, will proceed with admission.   _______________________________   As part of my medical decision making I have reviewed available labs, radiology tests, reviewed old records/performed chart review,  discussed with consultants (orthopedics).     Final Clinical Impression(s) / ED Diagnosis  Fall Superior and inferior rami fracture    Note:  This document was prepared using Dragon voice recognition software and may include unintentional dictation errors.   Lilia Pro., MD 10/03/19 [phone]

## 2019-10-03 NOTE — ED Triage Notes (Signed)
First nurse note- pt lost balance and fell. C/o left hip pain. Unable to bear weight on left leg.

## 2019-10-03 NOTE — ED Notes (Signed)
Pt to the er for pain to the left hip resulting from a fall. Pt states she slipped on the hard wood floor and landed on her left hip. Pt denies pain meds at this time. Pt states she is comfortable as long as she can leave her knees bent. MD at the bedside. Will notify daughter Clarene Critchley per pt request.

## 2019-10-03 NOTE — ED Notes (Signed)
Pt given a sandwich tray and something to drink. One bed rail left down so pt could access the side table and food. Pt is unable to move to get out of bed and pt verbalizes this understanding.

## 2019-10-03 NOTE — ED Triage Notes (Signed)
Patient by POV from home for fall. Reports tripping over feet. C/o left hip pain and unable to bear weight on left leg

## 2019-10-03 NOTE — ED Notes (Signed)
Pt yelling out of room. Pt states her covers needs to be straightened and she cant find her remote. Head of the bed let back per request. Pt redirected to use the call bell. Pt states she is hot but will not allow RN to remove blankets. Water at the bedside and call bell. VSS

## 2019-10-04 ENCOUNTER — Encounter: Payer: Self-pay | Admitting: Family Medicine

## 2019-10-04 DIAGNOSIS — N184 Chronic kidney disease, stage 4 (severe): Secondary | ICD-10-CM | POA: Diagnosis not present

## 2019-10-04 DIAGNOSIS — S32591A Other specified fracture of right pubis, initial encounter for closed fracture: Secondary | ICD-10-CM | POA: Diagnosis not present

## 2019-10-04 DIAGNOSIS — S32592A Other specified fracture of left pubis, initial encounter for closed fracture: Secondary | ICD-10-CM | POA: Diagnosis not present

## 2019-10-04 LAB — CBC
HCT: 36.6 % (ref 36.0–46.0)
Hemoglobin: 12.1 g/dL (ref 12.0–15.0)
MCH: 31.1 pg (ref 26.0–34.0)
MCHC: 33.1 g/dL (ref 30.0–36.0)
MCV: 94.1 fL (ref 80.0–100.0)
Platelets: 197 10*3/uL (ref 150–400)
RBC: 3.89 MIL/uL (ref 3.87–5.11)
RDW: 12 % (ref 11.5–15.5)
WBC: 6.9 10*3/uL (ref 4.0–10.5)
nRBC: 0 % (ref 0.0–0.2)

## 2019-10-04 LAB — BASIC METABOLIC PANEL
Anion gap: 9 (ref 5–15)
BUN: 25 mg/dL — ABNORMAL HIGH (ref 8–23)
CO2: 26 mmol/L (ref 22–32)
Calcium: 8.7 mg/dL — ABNORMAL LOW (ref 8.9–10.3)
Chloride: 102 mmol/L (ref 98–111)
Creatinine, Ser: 1.38 mg/dL — ABNORMAL HIGH (ref 0.44–1.00)
GFR calc Af Amer: 39 mL/min — ABNORMAL LOW (ref >60)
GFR calc non Af Amer: 33 mL/min — ABNORMAL LOW (ref >60)
Glucose, Bld: 100 mg/dL — ABNORMAL HIGH (ref 70–99)
Potassium: 4.3 mmol/L (ref 3.5–5.1)
Sodium: 137 mmol/L (ref 135–145)

## 2019-10-04 LAB — PHOSPHORUS: Phosphorus: 2.4 mg/dL — ABNORMAL LOW (ref 2.5–4.6)

## 2019-10-04 LAB — MAGNESIUM: Magnesium: 1.7 mg/dL (ref 1.7–2.4)

## 2019-10-04 MED ORDER — ATORVASTATIN CALCIUM 20 MG PO TABS
40.0000 mg | ORAL_TABLET | Freq: Every day | ORAL | Status: DC
  Administered 2019-10-04 – 2019-10-05 (×2): 40 mg via ORAL
  Filled 2019-10-04 (×2): qty 2

## 2019-10-04 MED ORDER — ACETAMINOPHEN 650 MG RE SUPP: 650.0000 mg | Freq: Four times a day (QID) | RECTAL | Status: DC | PRN

## 2019-10-04 MED ORDER — SENNOSIDES-DOCUSATE SODIUM 8.6-50 MG PO TABS: 1.0000 | ORAL_TABLET | Freq: Every evening | ORAL | Status: DC | PRN

## 2019-10-04 MED ORDER — HEPARIN SODIUM (PORCINE) 5000 UNIT/ML IJ SOLN
5000.0000 [IU] | Freq: Three times a day (TID) | INTRAMUSCULAR | Status: DC
  Administered 2019-10-04 – 2019-10-06 (×7): 5000 [IU] via SUBCUTANEOUS
  Filled 2019-10-04 (×7): qty 1

## 2019-10-04 MED ORDER — CITALOPRAM HYDROBROMIDE 10 MG PO TABS
10.0000 mg | ORAL_TABLET | Freq: Every day | ORAL | Status: DC
  Administered 2019-10-04 – 2019-10-06 (×3): 10 mg via ORAL
  Filled 2019-10-04 (×4): qty 1

## 2019-10-04 MED ORDER — ACETAMINOPHEN 325 MG PO TABS
650.0000 mg | ORAL_TABLET | Freq: Four times a day (QID) | ORAL | Status: DC | PRN
  Administered 2019-10-04 (×2): 650 mg via ORAL
  Filled 2019-10-04 (×2): qty 2

## 2019-10-04 MED ORDER — ONDANSETRON HCL 4 MG PO TABS: 4.0000 mg | ORAL_TABLET | Freq: Four times a day (QID) | ORAL | Status: DC | PRN

## 2019-10-04 MED ORDER — BISACODYL 5 MG PO TBEC: 5.0000 mg | DELAYED_RELEASE_TABLET | Freq: Every day | ORAL | Status: DC | PRN

## 2019-10-04 MED ORDER — MIRTAZAPINE 15 MG PO TABS
15.0000 mg | ORAL_TABLET | Freq: Every day | ORAL | Status: DC
  Administered 2019-10-04 – 2019-10-05 (×2): 15 mg via ORAL
  Filled 2019-10-04 (×2): qty 1

## 2019-10-04 MED ORDER — AMLODIPINE BESYLATE 5 MG PO TABS
5.0000 mg | ORAL_TABLET | Freq: Every day | ORAL | Status: DC
  Administered 2019-10-04 – 2019-10-06 (×3): 5 mg via ORAL
  Filled 2019-10-04 (×3): qty 1

## 2019-10-04 MED ORDER — ONDANSETRON HCL 4 MG/2ML IJ SOLN: 4.0000 mg | Freq: Four times a day (QID) | INTRAMUSCULAR | Status: DC | PRN

## 2019-10-04 MED ORDER — DONEPEZIL HCL 5 MG PO TABS
10.0000 mg | ORAL_TABLET | Freq: Every day | ORAL | Status: DC
  Administered 2019-10-04 – 2019-10-05 (×2): 10 mg via ORAL
  Filled 2019-10-04 (×2): qty 2

## 2019-10-04 MED ORDER — ALBUTEROL SULFATE (2.5 MG/3ML) 0.083% IN NEBU: 2.5000 mg | INHALATION_SOLUTION | Freq: Four times a day (QID) | RESPIRATORY_TRACT | Status: DC | PRN

## 2019-10-04 MED ORDER — ASPIRIN EC 81 MG PO TBEC
81.0000 mg | DELAYED_RELEASE_TABLET | Freq: Every day | ORAL | Status: DC
  Administered 2019-10-04 – 2019-10-06 (×3): 81 mg via ORAL
  Filled 2019-10-04 (×3): qty 1

## 2019-10-04 MED ORDER — SPIRONOLACTONE 25 MG PO TABS
25.0000 mg | ORAL_TABLET | Freq: Every day | ORAL | Status: DC
  Administered 2019-10-04 – 2019-10-06 (×3): 25 mg via ORAL
  Filled 2019-10-04 (×3): qty 1

## 2019-10-04 MED ORDER — IPRATROPIUM BROMIDE 0.02 % IN SOLN: 0.5000 mg | Freq: Four times a day (QID) | RESPIRATORY_TRACT | Status: DC | PRN

## 2019-10-04 MED ORDER — TRAMADOL HCL 50 MG PO TABS
50.0000 mg | ORAL_TABLET | Freq: Four times a day (QID) | ORAL | Status: DC | PRN
  Administered 2019-10-04 – 2019-10-06 (×3): 50 mg via ORAL
  Filled 2019-10-04 (×3): qty 1

## 2019-10-04 NOTE — Consult Note (Addendum)
Full consult note to follow  Orthopedics consulted on this 86 year old female who sustained left superior and inferior pubic rami fractures with mild displacement and non-displaced sacral fracture status post fall.  I reviewed the patient's x-rays and CT scan showing fractures of the left superior and inferior pubic rami.  These fractures will not require surgical intervention.  Patient may be weight-bear as tolerated on the left lower extremity.  I recommend physical therapy evaluation of the patient.

## 2019-10-04 NOTE — Progress Notes (Signed)
PT Cancellation Note  Patient Details Name: Becky Prince MRN: [id number] DOB: [birth year]   Cancelled Treatment:    Reason Eval/Treat Not Completed: Other (comment). Consult received and chart reviewed. Pt currently with pending ortho consult due to fall and fracture. Will hold at this time. Please advise of appropriate WBing status.   Jashley Yellin 10/04/2019, 12:44 PM  Greggory Stallion, PT, DPT [phone]

## 2019-10-04 NOTE — Plan of Care (Signed)
  Problem: Education: Goal: Knowledge of General Education information will improve Description: Including pain rating scale, medication(s)/side effects and non-pharmacologic comfort measures Outcome: Completed/Met

## 2019-10-04 NOTE — Progress Notes (Addendum)
PROGRESS NOTE    Becky Prince  ELF:[id number] DOB: [birth year] DOA: 10/03/2019 PCP: Virginia Crews, MD    Assessment & Plan:   Active Problems:   Bilateral pubic rami fractures (HCC)   Fracture of multiple pubic rami, left, closed, initial encounter ([place])    Becky Prince is a 86 y.o. Caucasian female with a known history of hypertension, hyperlipidemia, osteoporosis, dementia, depression and allergies presents to the emergency department for evaluation of left hip pain following a mechanical fall.   #.  Left superior and inferior pubic rami fractures --No surgical intervention needed, per ortho Dr. Mack Guise. -Pain control -PT evaluation rec SNF rehab  #.  History of hypertension -Continue Norvasc and spironolactone  #.  History of hyperlipidemia -Continue Lipitor  #. History of depression, dementia - Continue Celexa, Aricept, Remeron  #. History of allergies - Continue Flonase   DVT prophylaxis: Heparin SQ Code Status: Full code  Family Communication: daughter updated at bedside today Status is: inpatient Dispo:   The patient is from: home Anticipated d/c is to: SNF Anticipated d/c date is: when bed available  Patient currently is medically stable to d/c.   Subjective and Interval History:  Pt reported no pain when she sat with pillows under her legs.  No fever, dyspnea, chest pain, abdominal pain, N/V/D.   Objective: Vitals:   10/04/19 0755 10/04/19 1421 10/04/19 1446 10/04/19 1715  BP: 140/65 (!) 129/58 (!) 143/62 134/73  Pulse: 75 77 74 90  Resp: 17 16  16   Temp: 98.3 F (36.8 C) 98.5 F (36.9 C) 98.2 F (36.8 C) 98.1 F (36.7 C)  TempSrc: Oral Oral Oral Oral  SpO2: 92% 93% 93% 92%  Weight:      Height:        Intake/Output Summary (Last 24 hours) at 10/04/2019 2106 Last data filed at 10/04/2019 1723 Gross per 24 hour  Intake --  Output 1 ml  Net -1 ml   Filed Weights   10/03/19 1822  Weight: 59.9 kg    Examination:    Constitutional: NAD, AAOx3, though has some difficulty with comprehension HEENT: conjunctivae and lids normal, EOMI CV: RRR no M,R,G. Distal pulses +2.  No cyanosis.   RESP: CTA B/L, normal respiratory effort  GI: +BS, NTND Extremities: No effusions, edema, or tenderness in BLE SKIN: warm, dry, patches of bruising  Neuro: II - XII grossly intact.  Sensation intact   Data Reviewed: I have personally reviewed following labs and imaging studies  CBC: Recent Labs  Lab 10/03/19 1828 10/04/19 0547  WBC 11.2* 6.9  NEUTROABS 8.4*  --   HGB 13.7 12.1  HCT 42.2 36.6  MCV 97.9 94.1  PLT 238 585   Basic Metabolic Panel: Recent Labs  Lab 10/03/19 1828 10/04/19 0547  NA 138 137  K 4.7 4.3  CL 102 102  CO2 28 26  GLUCOSE 108* 100*  BUN 28* 25*  CREATININE 1.47* 1.38*  CALCIUM 8.7* 8.7*  MG  --  1.7  PHOS  --  2.4*   GFR: Estimated Creatinine Clearance: 25.1 mL/min (A) (by C-G formula based on SCr of 1.38 mg/dL (H)). Liver Function Tests: Recent Labs  Lab 10/03/19 1828  AST 24  ALT 18  ALKPHOS 119  BILITOT 0.8  PROT 7.3  ALBUMIN 4.0   No results for input(s): LIPASE, AMYLASE in the last 168 hours. No results for input(s): AMMONIA in the last 168 hours. Coagulation Profile: No results for input(s): INR, PROTIME in the last  168 hours. Cardiac Enzymes: No results for input(s): CKTOTAL, CKMB, CKMBINDEX, TROPONINI in the last 168 hours. BNP (last 3 results) No results for input(s): PROBNP in the last 8760 hours. HbA1C: No results for input(s): HGBA1C in the last 72 hours. CBG: No results for input(s): GLUCAP in the last 168 hours. Lipid Profile: No results for input(s): CHOL, HDL, LDLCALC, TRIG, CHOLHDL, LDLDIRECT in the last 72 hours. Thyroid Function Tests: No results for input(s): TSH, T4TOTAL, FREET4, T3FREE, THYROIDAB in the last 72 hours. Anemia Panel: No results for input(s): VITAMINB12, FOLATE, FERRITIN, TIBC, IRON, RETICCTPCT in the last 72 hours. Sepsis  Labs: No results for input(s): PROCALCITON, LATICACIDVEN in the last 168 hours.  Recent Results (from the past 240 hour(s))  SARS Coronavirus 2 by RT PCR (hospital order, performed in [facility] hospital lab) Nasopharyngeal Nasopharyngeal Swab     Status: None   Collection Time: 10/03/19  7:35 PM   Specimen: Nasopharyngeal Swab  Result Value Ref Range Status   SARS Coronavirus 2 NEGATIVE NEGATIVE Final    Comment: (NOTE) SARS-CoV-2 target nucleic acids are NOT DETECTED. The SARS-CoV-2 RNA is generally detectable in upper and lower respiratory specimens during the acute phase of infection. The lowest concentration of SARS-CoV-2 viral copies this assay can detect is 250 copies / mL. A negative result does not preclude SARS-CoV-2 infection and should not be used as the sole basis for treatment or other patient management decisions.  A negative result may occur with improper specimen collection / handling, submission of specimen other than nasopharyngeal swab, presence of viral mutation(s) within the areas targeted by this assay, and inadequate number of viral copies (<250 copies / mL). A negative result must be combined with clinical observations, patient history, and epidemiological information. Fact Sheet for Patients:   StrictlyIdeas.no Fact Sheet for Healthcare Providers: BankingDealers.co.za This test is not yet approved or cleared  by the [place] FDA and has been authorized for detection and/or diagnosis of SARS-CoV-2 by FDA under an Emergency Use Authorization (EUA).  This EUA will remain in effect (meaning this test can be used) for the duration of the COVID-19 declaration under Section 564(b)(1) of the Act, 21 U.S.C. section 360bbb-3(b)(1), unless the authorization is terminated or revoked sooner. Performed at [facility], [address], [place], [place] [address]       Radiology Studies: DG Chest 1  View  Result Date: 10/03/2019 CLINICAL DATA:  Preop.  Trip and fall with left hip fracture. EXAM: CHEST  1 VIEW COMPARISON:  None. FINDINGS: Borderline cardiomegaly. Normal mediastinal contours. Aortic atherosclerosis. Slight interstitial coarsening of unknown chronicity. No confluent airspace disease, pleural effusion, or pneumothorax. No acute osseous abnormalities are seen. IMPRESSION: 1. Borderline cardiomegaly.  Aortic Atherosclerosis (ICD10-I70.0). 2. Slight interstitial coarsening of unknown chronicity, likely chronic. Electronically Signed   By: Keith Rake M.D.   On: 10/03/2019 20:01   CT Abdomen Pelvis W Contrast  Result Date: 10/03/2019 CLINICAL DATA:  Trip and fall with left hip fracture. Rule out intra-abdominal/pelvic bleeding. EXAM: CT ABDOMEN AND PELVIS WITH CONTRAST TECHNIQUE: Multidetector CT imaging of the abdomen and pelvis was performed using the standard protocol following bolus administration of intravenous contrast. CONTRAST:  23mL OMNIPAQUE IOHEXOL 300 MG/ML  SOLN COMPARISON:  Hip radiograph earlier today. FINDINGS: Lower chest: Mild cardiomegaly. No pleural fluid or basilar airspace disease. Hepatobiliary: Scattered hepatic cysts as well as low-density lesions too small to accurately characterize. No evidence of suspicious lesion. No hepatic injury or perihepatic hematoma. Gallbladder physiologically distended,  no calcified stone. No biliary dilatation. Pancreas: No ductal dilatation or inflammation. Spleen: Normal in size. No evidence of injury or perisplenic hematoma. There is splenic artery tortuosity which is densely calcified. Adrenals/Urinary Tract: No adrenal hemorrhage or nodule. No evidence of renal injury. Mild bilateral renal parenchymal thinning. Simple cyst in the lower right kidney. Low-density lesion in the mid left kidney that is too small to accurately characterize. Urinary bladder is unremarkable. No evidence of bladder injury. Stomach/Bowel: Nondistended stomach.  Small duodenal diverticulum. No evidence of small bowel obstruction, inflammation, or bowel injury. No mesenteric hematoma. Moderate volume of stool throughout the colon. No colonic wall thickening or evidence of injury. Findings not definitively visualized, no appendicitis. Vascular/Lymphatic: No vascular injury. Abdominal aorta is normal in caliber. Moderate aortic atherosclerosis. Aortic tortuosity. There is bi-iliac atherosclerosis and tortuosity. No evidence of active bleeding related to left pubic rami fractures. No adenopathy. Reproductive: Status post hysterectomy. No adnexal masses. Other: No free fluid or free air in the abdomen or pelvis. Musculoskeletal: Left superior and inferior pubic rami fractures, mildly displaced and comminuted. Superior image fracture extends to the pubic body but not definitively the pubic symphysis. There is no acetabular involvement. No associated pelvic hematoma. There is a nondisplaced zone 2 sacral fracture. Scoliotic curvature in the lumbar spine with multilevel degenerative change. No acute lumbar no fracture of included lower ribs. Fracture. IMPRESSION: 1. Left superior and inferior pubic rami fractures, mildly displaced and comminuted. No associated pelvic hematoma. 2. Nondisplaced zone 2 sacral fracture. 3. No additional acute traumatic injury to the abdomen or pelvis. Aortic Atherosclerosis (ICD10-I70.0). Electronically Signed   By: Keith Rake M.D.   On: 10/03/2019 20:13   DG Hip Unilat With Pelvis 2-3 Views Left  Result Date: 10/03/2019 CLINICAL DATA:  Left hip pain after trip and fall today. Initial encounter. EXAM: DG HIP (WITH OR WITHOUT PELVIS) 2-3V LEFT COMPARISON:  None. FINDINGS: The patient has acute left superior and inferior pubic ramus fractures. No other fracture is identified. In particular, no hip fracture is seen. Both hips are located. There is some chondrocalcinosis about the hips. Convex left lumbar scoliosis and degenerative change noted.  IMPRESSION: Acute left superior and inferior pubic ramus fractures. Negative for hip fracture. Electronically Signed   By: Inge Rise M.D.   On: 10/03/2019 19:24     Scheduled Meds: . amLODipine  5 mg Oral Daily  . aspirin EC  81 mg Oral Daily  . atorvastatin  40 mg Oral Daily  . citalopram  10 mg Oral Daily  . donepezil  10 mg Oral QHS  . heparin  5,000 Units Subcutaneous Q8H  . mirtazapine  15 mg Oral QHS  . spironolactone  25 mg Oral Daily   Continuous Infusions:   LOS: 0 days     Enzo Bi, MD [facility] If 7PM-7AM, please contact night-coverage 10/04/2019, 9:06 PM

## 2019-10-04 NOTE — ED Notes (Signed)
Pt currently resting quietly and in no obvious distress. Vital signs are WNL. Call bell within reach and the stretcher is locked in the lowest position. Toilet options offered. Patient updated on plan of care. Patient denies any needs at this time. Will continue to monitor patient status and be available for patient needs.

## 2019-10-04 NOTE — TOC Progression Note (Signed)
Transition of Care [facility]) - Progression Note    Patient Details  Name: Becky Prince MRN: [id number] Date of Birth: 1925/06/25  Transition of Care [facility]) CM/SW [place], RN Phone Number: 10/04/2019, 2:41 PM  Clinical Narrative:     Met with the patient and her daughter in the room to discuss DC plan and needs The patient was living alone and independent prior to admission, she still drives and walks without assistance She prefers to go home with Home health services if possible, they have asked to speak to the physician, I requested the physician to go in and see the patient.  The patient and her daughter is asking why is surgery not required or recommended, what is the prognosis of healing and the patient is not able to bear weigh on the left side so therefor can not get up without assistance They want to see the physician to address their questions and concerns, I notified the physician and expressed their concerns and questions       Expected Discharge Plan and Services                                                 Social Determinants of Health (SDOH) Interventions    Readmission Risk Interventions No flowsheet data found.

## 2019-10-04 NOTE — Evaluation (Signed)
Physical Therapy Evaluation Patient Details Name: Becky Prince MRN: [id number] DOB: 1926/02/09 Today's Date: 10/04/2019   History of Present Illness  Pt admitted for L pubic rami fractures and a nondisplaced sacral fracture. Pt with complaints of fall on L hip. Ortho cleared for PT evaluation, WBAT on L LE>  Clinical Impression  Pt is a pleasant 86 year old female who was admitted for L pubic ramus fractures and sacral fracture. Pt performs bed mobility/transfers with min assist and ambulation with min/mod assist and use of RW. Pt is very fearful of pain, however actually is surprised how well she is able to tolerate limited ambulation. Limited WBing tolerated, however reports she doesn't want pain meds when asked. Pt demonstrates deficits with strength/mobility/endurance/pain Pt is currently not at baseline level. Would benefit from skilled PT to address above deficits and promote optimal return to PLOF; recommend transition to STR upon discharge from acute hospitalization.     Follow Up Recommendations SNF    Equipment Recommendations  Rolling walker with 5" wheels    Recommendations for Other Services       Precautions / Restrictions Precautions Precautions: Fall Restrictions Weight Bearing Restrictions: Yes LLE Weight Bearing: Weight bearing as tolerated      Mobility  Bed Mobility Overal bed mobility: Needs Assistance Bed Mobility: Supine to Sit     Supine to sit: Min assist     General bed mobility comments: able to perform with safe technique. Takes extended time. Follows commands well. Once seated at EOB, able to scoot towards EOB  Transfers Overall transfer level: Needs assistance Equipment used: Rolling walker (2 wheeled) Transfers: Sit to/from Stand Sit to Stand: Min assist         General transfer comment: safe technique with cues for pushing from seated surface. Once standing, urgent need to transfer to [facility].  Ambulation/Gait Ambulation/Gait assistance:  Min assist;Mod assist Gait Distance (Feet): 3 Feet Assistive device: Rolling walker (2 wheeled) Gait Pattern/deviations: Step-to pattern     General Gait Details: ambulated to [facility] and then a few steps in room with RW and slow step to gait pattern. Very pain limited with WBing limited and heavy use of B UE.  Stairs            Wheelchair Mobility    Modified Rankin (Stroke Patients Only)       Balance Overall balance assessment: Needs assistance Sitting-balance support: Feet supported Sitting balance-Leahy Scale: Good     Standing balance support: Bilateral upper extremity supported Standing balance-Leahy Scale: Fair Standing balance comment: heavy B UE assist on RW                             Pertinent Vitals/Pain Pain Assessment: 0-10 Pain Score: 2  Pain Location: L hip Pain Descriptors / Indicators: Aching Pain Intervention(s): Limited activity within patient's tolerance    Home Living Family/patient expects to be discharged to:: Private residence Living Arrangements: Alone Available Help at Discharge: Family;Available PRN/intermittently(daughter) Type of Home: House Home Access: Stairs to enter   Entrance Stairs-Number of Steps: 4 Home Layout: Multi-level;Laundry or work area in [place]: Environmental consultant - 4 wheels;Cane - single point Additional Comments: stays on main level with bed/bath. Rarely goes up to 2nd floor. Uses electric stair lift for transition to basement and level entry to garage    Prior Function Level of Independence: Independent         Comments: carries SPC in car in  case of uneven surfaces, however doesn't regulary use.     Hand Dominance        Extremity/Trunk Assessment   Upper Extremity Assessment Upper Extremity Assessment: Overall WFL for tasks assessed    Lower Extremity Assessment Lower Extremity Assessment: Generalized weakness(B LE grossly 4/5; L LE pain limited)       Communication    Communication: No difficulties  Cognition Arousal/Alertness: Awake/alert Behavior During Therapy: WFL for tasks assessed/performed Overall Cognitive Status: Within Functional Limits for tasks assessed                                        General Comments      Exercises Other Exercises Other Exercises: ambulated to [facility]. Needs cues for safety. Able to perform hygiene with set up provided.    Assessment/Plan    PT Assessment Patient needs continued PT services  PT Problem List Decreased strength;Decreased activity tolerance;Decreased balance;Decreased mobility;Pain;Decreased knowledge of use of DME       PT Treatment Interventions DME instruction;Gait training;Therapeutic exercise;Balance training;Stair training    PT Goals (Current goals can be found in the Care Plan section)  Acute Rehab PT Goals Patient Stated Goal: to get better PT Goal Formulation: With patient Time For Goal Achievement: 10/18/19 Potential to Achieve Goals: Good    Frequency 7X/week   Barriers to discharge Decreased caregiver support;Inaccessible home environment      Co-evaluation               AM-PAC PT "6 Clicks" Mobility  Outcome Measure Help needed turning from your back to your side while in a flat bed without using bedrails?: A Little Help needed moving from lying on your back to sitting on the side of a flat bed without using bedrails?: A Little Help needed moving to and from a bed to a chair (including a wheelchair)?: A Lot Help needed standing up from a chair using your arms (e.g., wheelchair or bedside chair)?: A Little Help needed to walk in hospital room?: A Lot Help needed climbing 3-5 steps with a railing? : Total 6 Click Score: 14    End of Session Equipment Utilized During Treatment: Gait belt Activity Tolerance: Patient tolerated treatment well Patient left: in chair;with chair alarm set Nurse Communication: Mobility status PT Visit Diagnosis: Muscle  weakness (generalized) (M62.81);Difficulty in walking, not elsewhere classified (R26.2);Pain Pain - Right/Left: Left Pain - part of body: Hip    Time: 1449-1533 PT Time Calculation (min) (ACUTE ONLY): 44 min   Charges:   PT Evaluation $PT Eval Moderate Complexity: 1 Mod PT Treatments $Therapeutic Activity: 23-37 mins        Greggory Stallion, PT, DPT [phone]   Jarrin Staley 10/04/2019, 5:18 PM

## 2019-10-04 NOTE — ED Notes (Signed)
Pt placed on a hospital bed for comfort. Pt requesting tylenol for pain. Pt covered up as she requested. Glasses removed and placed on the table. Pt placed on 2L of O2 as sats drop when she sleeps.

## 2019-10-05 DIAGNOSIS — S32591A Other specified fracture of right pubis, initial encounter for closed fracture: Secondary | ICD-10-CM | POA: Diagnosis not present

## 2019-10-05 DIAGNOSIS — S32592A Other specified fracture of left pubis, initial encounter for closed fracture: Secondary | ICD-10-CM | POA: Diagnosis not present

## 2019-10-05 LAB — CBC
HCT: 38 % (ref 36.0–46.0)
Hemoglobin: 12.5 g/dL (ref 12.0–15.0)
MCH: 31.9 pg (ref 26.0–34.0)
MCHC: 32.9 g/dL (ref 30.0–36.0)
MCV: 96.9 fL (ref 80.0–100.0)
Platelets: 201 10*3/uL (ref 150–400)
RBC: 3.92 MIL/uL (ref 3.87–5.11)
RDW: 12.3 % (ref 11.5–15.5)
WBC: 6.7 10*3/uL (ref 4.0–10.5)
nRBC: 0 % (ref 0.0–0.2)

## 2019-10-05 LAB — BASIC METABOLIC PANEL
Anion gap: 5 (ref 5–15)
BUN: 19 mg/dL (ref 8–23)
CO2: 30 mmol/L (ref 22–32)
Calcium: 8.7 mg/dL — ABNORMAL LOW (ref 8.9–10.3)
Chloride: 103 mmol/L (ref 98–111)
Creatinine, Ser: 1.31 mg/dL — ABNORMAL HIGH (ref 0.44–1.00)
GFR calc Af Amer: 41 mL/min — ABNORMAL LOW (ref >60)
GFR calc non Af Amer: 36 mL/min — ABNORMAL LOW (ref >60)
Glucose, Bld: 89 mg/dL (ref 70–99)
Potassium: 4 mmol/L (ref 3.5–5.1)
Sodium: 138 mmol/L (ref 135–145)

## 2019-10-05 LAB — MAGNESIUM: Magnesium: 1.8 mg/dL (ref 1.7–2.4)

## 2019-10-05 NOTE — Progress Notes (Signed)
Physical Therapy Treatment Patient Details Name: Becky Prince MRN: [id number] DOB: 1925-06-22 Today's Date: 10/05/2019    History of Present Illness Pt admitted for L pubic rami fractures and a nondisplaced sacral fracture. Pt with complaints of fall on L hip. Ortho cleared for PT evaluation, WBAT on L LE>    PT Comments    Participated in exercises as described below.  Pt to EOB with min a x 1.  Steady in sitting, Stood with mod a x 1.  A few moments to gait balance and step in place before she is able to walk several steps to chair with min/mod a x 1.  Remained in recliner at rest with needs met.  Daughter in room.   Follow Up Recommendations  SNF     Equipment Recommendations       Recommendations for Other Services       Precautions / Restrictions Precautions Precautions: Fall    Mobility  Bed Mobility Overal bed mobility: Needs Assistance Bed Mobility: Supine to Sit     Supine to sit: Min assist        Transfers Overall transfer level: Needs assistance Equipment used: Rolling walker (2 wheeled) Transfers: Sit to/from Stand Sit to Stand: Min assist;Mod assist            Ambulation/Gait Ambulation/Gait assistance: Min assist;Mod assist Gait Distance (Feet): 3 Feet Assistive device: Rolling walker (2 wheeled) Gait Pattern/deviations: Step-to pattern Gait velocity: decreased   General Gait Details: able to walk few steps to recliner with min/mod a x 1.   Stairs             Wheelchair Mobility    Modified Rankin (Stroke Patients Only)       Balance Overall balance assessment: Needs assistance   Sitting balance-Leahy Scale: Good     Standing balance support: Bilateral upper extremity supported Standing balance-Leahy Scale: Fair Standing balance comment: heavy B UE assist on RW                            Cognition Arousal/Alertness: Awake/alert Behavior During Therapy: WFL for tasks assessed/performed Overall Cognitive  Status: Within Functional Limits for tasks assessed                                        Exercises Other Exercises Other Exercises: quad and glut sets x 10 in supine, ankle pumps, LAQ and marches (L only due to pain R) BLE x 10    General Comments        Pertinent Vitals/Pain Pain Assessment: 0-10 Pain Score: 2  Pain Location: L hip rest Pain Descriptors / Indicators: Aching Pain Intervention(s): Limited activity within patient's tolerance;Monitored during session;Repositioned    Home Living                      Prior Function            PT Goals (current goals can now be found in the care plan section) Progress towards PT goals: Progressing toward goals    Frequency    7X/week      PT Plan Current plan remains appropriate    Co-evaluation              AM-PAC PT "6 Clicks" Mobility   Outcome Measure  Help needed turning from your back to your side  while in a flat bed without using bedrails?: A Little Help needed moving from lying on your back to sitting on the side of a flat bed without using bedrails?: A Little Help needed moving to and from a bed to a chair (including a wheelchair)?: A Lot Help needed standing up from a chair using your arms (e.g., wheelchair or bedside chair)?: A Little Help needed to walk in hospital room?: A Lot Help needed climbing 3-5 steps with a railing? : Total 6 Click Score: 14    End of Session Equipment Utilized During Treatment: Gait belt Activity Tolerance: Patient tolerated treatment well Patient left: in chair;with chair alarm set;with call bell/phone within reach;with family/visitor present Nurse Communication: Mobility status Pain - Right/Left: Left Pain - part of body: Hip     Time: 3958-4417 PT Time Calculation (min) (ACUTE ONLY): 23 min  Charges:  $Gait Training: 8-22 mins $Therapeutic Exercise: 8-22 mins                    Chesley Noon, PTA 10/05/19, 2:24 PM

## 2019-10-05 NOTE — Progress Notes (Signed)
PROGRESS NOTE    Becky Prince  UMP:[id number] DOB: [birth year] DOA: 10/03/2019 PCP: Virginia Crews, MD    Assessment & Plan:   Active Problems:   Bilateral pubic rami fractures (HCC)   Fracture of multiple pubic rami, left, closed, initial encounter ([place])    Becky Prince is a 86 y.o. Caucasian female with a known history of hypertension, hyperlipidemia, osteoporosis, dementia, depression and allergies presents to the emergency department for evaluation of left hip pain following a mechanical fall.   #.  Left superior and inferior pubic rami fractures --No surgical intervention needed, per ortho Dr. Mack Guise. -Pain control -PT evaluation rec SNF rehab  #.  History of hypertension -Continue Norvasc and spironolactone  #.  History of hyperlipidemia -Continue Lipitor  #. History of depression, dementia - Continue Celexa, Aricept, Remeron  #. History of allergies - Continue Flonase   DVT prophylaxis: Heparin SQ Code Status: Full code  Family Communication:  Status is: inpatient Dispo:   The patient is from: home Anticipated d/c is to: SNF Anticipated d/c date is: when bed available  Patient currently is medically stable to d/c.   Subjective and Interval History:  Pt reported pain improved with sitting up in chair.  No fever, dyspnea, chest pain, abdominal pain, N/V/D.  Had BM.  Normal oral intake.   Objective: Vitals:   10/04/19 1715 10/05/19 0013 10/05/19 0801 10/05/19 1559  BP: 134/73 (!) 144/58 123/64 (!) 123/56  Pulse: 90 79 75 75  Resp: 16 19 17 17   Temp: 98.1 F (36.7 C) 98.7 F (37.1 C) 98.2 F (36.8 C) 98.4 F (36.9 C)  TempSrc: Oral Oral  Oral  SpO2: 92% 92% 93% 93%  Weight:      Height:        Intake/Output Summary (Last 24 hours) at 10/05/2019 1649 Last data filed at 10/04/2019 1723 Gross per 24 hour  Intake --  Output 1 ml  Net -1 ml   Filed Weights   10/03/19 1822  Weight: 59.9 kg    Examination:   Constitutional: NAD,  AAOx3, sitting up in chair HEENT: conjunctivae and lids normal, EOMI CV: RRR no M,R,G. Distal pulses +2.  No cyanosis.   RESP: CTA B/L, normal respiratory effort  GI: +BS, NTND Extremities: No effusions, edema, or tenderness in BLE SKIN: warm, dry, patches of bruising  Neuro: II - XII grossly intact.  Sensation intact   Data Reviewed: I have personally reviewed following labs and imaging studies  CBC: Recent Labs  Lab 10/03/19 1828 10/04/19 0547 10/05/19 0349  WBC 11.2* 6.9 6.7  NEUTROABS 8.4*  --   --   HGB 13.7 12.1 12.5  HCT 42.2 36.6 38.0  MCV 97.9 94.1 96.9  PLT 238 197 400   Basic Metabolic Panel: Recent Labs  Lab 10/03/19 1828 10/04/19 0547 10/05/19 0349  NA 138 137 138  K 4.7 4.3 4.0  CL 102 102 103  CO2 28 26 30   GLUCOSE 108* 100* 89  BUN 28* 25* 19  CREATININE 1.47* 1.38* 1.31*  CALCIUM 8.7* 8.7* 8.7*  MG  --  1.7 1.8  PHOS  --  2.4*  --    GFR: Estimated Creatinine Clearance: 26.5 mL/min (A) (by C-G formula based on SCr of 1.31 mg/dL (H)). Liver Function Tests: Recent Labs  Lab 10/03/19 1828  AST 24  ALT 18  ALKPHOS 119  BILITOT 0.8  PROT 7.3  ALBUMIN 4.0   No results for input(s): LIPASE, AMYLASE in the last 168  hours. No results for input(s): AMMONIA in the last 168 hours. Coagulation Profile: No results for input(s): INR, PROTIME in the last 168 hours. Cardiac Enzymes: No results for input(s): CKTOTAL, CKMB, CKMBINDEX, TROPONINI in the last 168 hours. BNP (last 3 results) No results for input(s): PROBNP in the last 8760 hours. HbA1C: No results for input(s): HGBA1C in the last 72 hours. CBG: No results for input(s): GLUCAP in the last 168 hours. Lipid Profile: No results for input(s): CHOL, HDL, LDLCALC, TRIG, CHOLHDL, LDLDIRECT in the last 72 hours. Thyroid Function Tests: No results for input(s): TSH, T4TOTAL, FREET4, T3FREE, THYROIDAB in the last 72 hours. Anemia Panel: No results for input(s): VITAMINB12, FOLATE, FERRITIN,  TIBC, IRON, RETICCTPCT in the last 72 hours. Sepsis Labs: No results for input(s): PROCALCITON, LATICACIDVEN in the last 168 hours.  Recent Results (from the past 240 hour(s))  SARS Coronavirus 2 by RT PCR (hospital order, performed in [facility] hospital lab) Nasopharyngeal Nasopharyngeal Swab     Status: None   Collection Time: 10/03/19  7:35 PM   Specimen: Nasopharyngeal Swab  Result Value Ref Range Status   SARS Coronavirus 2 NEGATIVE NEGATIVE Final    Comment: (NOTE) SARS-CoV-2 target nucleic acids are NOT DETECTED. The SARS-CoV-2 RNA is generally detectable in upper and lower respiratory specimens during the acute phase of infection. The lowest concentration of SARS-CoV-2 viral copies this assay can detect is 250 copies / mL. A negative result does not preclude SARS-CoV-2 infection and should not be used as the sole basis for treatment or other patient management decisions.  A negative result may occur with improper specimen collection / handling, submission of specimen other than nasopharyngeal swab, presence of viral mutation(s) within the areas targeted by this assay, and inadequate number of viral copies (<250 copies / mL). A negative result must be combined with clinical observations, patient history, and epidemiological information. Fact Sheet for Patients:   StrictlyIdeas.no Fact Sheet for Healthcare Providers: BankingDealers.co.za This test is not yet approved or cleared  by the [place] FDA and has been authorized for detection and/or diagnosis of SARS-CoV-2 by FDA under an Emergency Use Authorization (EUA).  This EUA will remain in effect (meaning this test can be used) for the duration of the COVID-19 declaration under Section 564(b)(1) of the Act, 21 U.S.C. section 360bbb-3(b)(1), unless the authorization is terminated or revoked sooner. Performed at [facility], [address] [place], [place]  [address]       Radiology Studies: DG Chest 1 View  Result Date: 10/03/2019 CLINICAL DATA:  Preop.  Trip and fall with left hip fracture. EXAM: CHEST  1 VIEW COMPARISON:  None. FINDINGS: Borderline cardiomegaly. Normal mediastinal contours. Aortic atherosclerosis. Slight interstitial coarsening of unknown chronicity. No confluent airspace disease, pleural effusion, or pneumothorax. No acute osseous abnormalities are seen. IMPRESSION: 1. Borderline cardiomegaly.  Aortic Atherosclerosis (ICD10-I70.0). 2. Slight interstitial coarsening of unknown chronicity, likely chronic. Electronically Signed   By: Keith Rake M.D.   On: 10/03/2019 20:01   CT Abdomen Pelvis W Contrast  Result Date: 10/03/2019 CLINICAL DATA:  Trip and fall with left hip fracture. Rule out intra-abdominal/pelvic bleeding. EXAM: CT ABDOMEN AND PELVIS WITH CONTRAST TECHNIQUE: Multidetector CT imaging of the abdomen and pelvis was performed using the standard protocol following bolus administration of intravenous contrast. CONTRAST:  66mL OMNIPAQUE IOHEXOL 300 MG/ML  SOLN COMPARISON:  Hip radiograph earlier today. FINDINGS: Lower chest: Mild cardiomegaly. No pleural fluid or basilar airspace disease. Hepatobiliary: Scattered hepatic cysts as well  as low-density lesions too small to accurately characterize. No evidence of suspicious lesion. No hepatic injury or perihepatic hematoma. Gallbladder physiologically distended, no calcified stone. No biliary dilatation. Pancreas: No ductal dilatation or inflammation. Spleen: Normal in size. No evidence of injury or perisplenic hematoma. There is splenic artery tortuosity which is densely calcified. Adrenals/Urinary Tract: No adrenal hemorrhage or nodule. No evidence of renal injury. Mild bilateral renal parenchymal thinning. Simple cyst in the lower right kidney. Low-density lesion in the mid left kidney that is too small to accurately characterize. Urinary bladder is unremarkable. No evidence of  bladder injury. Stomach/Bowel: Nondistended stomach. Small duodenal diverticulum. No evidence of small bowel obstruction, inflammation, or bowel injury. No mesenteric hematoma. Moderate volume of stool throughout the colon. No colonic wall thickening or evidence of injury. Findings not definitively visualized, no appendicitis. Vascular/Lymphatic: No vascular injury. Abdominal aorta is normal in caliber. Moderate aortic atherosclerosis. Aortic tortuosity. There is Prince-iliac atherosclerosis and tortuosity. No evidence of active bleeding related to left pubic rami fractures. No adenopathy. Reproductive: Status post hysterectomy. No adnexal masses. Other: No free fluid or free air in the abdomen or pelvis. Musculoskeletal: Left superior and inferior pubic rami fractures, mildly displaced and comminuted. Superior image fracture extends to the pubic body but not definitively the pubic symphysis. There is no acetabular involvement. No associated pelvic hematoma. There is a nondisplaced zone 2 sacral fracture. Scoliotic curvature in the lumbar spine with multilevel degenerative change. No acute lumbar no fracture of included lower ribs. Fracture. IMPRESSION: 1. Left superior and inferior pubic rami fractures, mildly displaced and comminuted. No associated pelvic hematoma. 2. Nondisplaced zone 2 sacral fracture. 3. No additional acute traumatic injury to the abdomen or pelvis. Aortic Atherosclerosis (ICD10-I70.0). Electronically Signed   By: Keith Rake M.D.   On: 10/03/2019 20:13   DG Hip Unilat With Pelvis 2-3 Views Left  Result Date: 10/03/2019 CLINICAL DATA:  Left hip pain after trip and fall today. Initial encounter. EXAM: DG HIP (WITH OR WITHOUT PELVIS) 2-3V LEFT COMPARISON:  None. FINDINGS: The patient has acute left superior and inferior pubic ramus fractures. No other fracture is identified. In particular, no hip fracture is seen. Both hips are located. There is some chondrocalcinosis about the hips. Convex  left lumbar scoliosis and degenerative change noted. IMPRESSION: Acute left superior and inferior pubic ramus fractures. Negative for hip fracture. Electronically Signed   By: Inge Rise M.D.   On: 10/03/2019 19:24     Scheduled Meds: . amLODipine  5 mg Oral Daily  . aspirin EC  81 mg Oral Daily  . atorvastatin  40 mg Oral Daily  . citalopram  10 mg Oral Daily  . donepezil  10 mg Oral QHS  . heparin  5,000 Units Subcutaneous Q8H  . mirtazapine  15 mg Oral QHS  . spironolactone  25 mg Oral Daily   Continuous Infusions:   LOS: 0 days     Becky Bi, MD [facility] If 7PM-7AM, please contact night-coverage 10/05/2019, 4:49 PM

## 2019-10-05 NOTE — NC FL2 (Signed)
[place] LEVEL OF CARE SCREENING TOOL     IDENTIFICATION  Patient Name: Becky Prince Birthdate: [birth year] Sex: female Admission Date (Current Location): 10/03/2019  [facility] and [place] Number:  Engineering geologist and Address:  [facility], [address] [place], [place] [address]      Provider Number: [id number]  Attending Physician Name and Address:  Enzo Bi, MD  Relative Name and Phone Number:  Helene Kelp Daughter [phone]    Current Level of Care: Hospital Recommended Level of Care: [place] Prior Approval Number:    Date Approved/Denied:   PASRR Number: [id number]  Discharge Plan: SNF    Current Diagnoses: Patient Active Problem List   Diagnosis Date Noted   Bilateral pubic rami fractures ([place]) 10/03/2019   Fracture of multiple pubic rami, left, closed, initial encounter ([place]) 10/03/2019   Adjustment disorder 02/13/2019   Night sweats 02/13/2019   Mixed dementia ([place]) 03/17/2018   Dupuytren's contracture of both hands 03/17/2018   Cerebrovascular disease 10/06/2016   Multinodular goiter 10/06/2016   TIA (transient ischemic attack) 09/28/2016   Osteoporosis 10/14/2015   Constipation 04/12/2015   Contracture of finger joint 04/12/2015   Hypercholesteremia 04/12/2015   BP (high blood pressure) 04/12/2015   Allergic rhinitis, seasonal 04/12/2015   Subclinical hyperthyroidism 11/16/2014    Orientation RESPIRATION BLADDER Height & Weight     Self, Time, Situation, Place  Normal Continent Weight: 59.9 kg Height:  5\' 9"  (175.3 cm)  BEHAVIORAL SYMPTOMS/MOOD NEUROLOGICAL BOWEL NUTRITION STATUS      Continent Diet (regular)  AMBULATORY STATUS COMMUNICATION OF NEEDS Skin   Extensive Assist Verbally Normal                       Personal Care Assistance Level of Assistance  Bathing, Dressing Bathing Assistance: Limited assistance   Dressing Assistance: Limited assistance      Functional Limitations Info             SPECIAL CARE FACTORS FREQUENCY  PT (By licensed PT)   Diabetic Urine Testing Frequency: N/A PT Frequency: 5 times per week              Contractures Contractures Info: Not present    Additional Factors Info  Code Status, Allergies Code Status Info: Full code Allergies Info: Naproxen, Oxycodone-aspirin, Prednisone           Current Medications (10/05/2019):  This is the current hospital active medication list Current Facility-Administered Medications  Medication Dose Route Frequency Provider Last Rate Last Admin   acetaminophen (TYLENOL) tablet 650 mg  650 mg Oral Q6H PRN Hugelmeyer, Alexis, DO   650 mg at 10/04/19 0836   Or   acetaminophen (TYLENOL) suppository 650 mg  650 mg Rectal Q6H PRN Hugelmeyer, Alexis, DO       albuterol (PROVENTIL) (2.5 MG/3ML) 0.083% nebulizer solution 2.5 mg  2.5 mg Nebulization Q6H PRN Hugelmeyer, Alexis, DO       amLODipine (NORVASC) tablet 5 mg  5 mg Oral Daily Enzo Bi, MD   5 mg at 10/05/19 6283   aspirin EC tablet 81 mg  81 mg Oral Daily Enzo Bi, MD   81 mg at 10/05/19 0924   atorvastatin (LIPITOR) tablet 40 mg  40 mg Oral Daily Enzo Bi, MD   40 mg at 10/04/19 1855   bisacodyl (DULCOLAX) EC tablet 5 mg  5 mg Oral Daily PRN Hugelmeyer, Alexis, DO       citalopram (CELEXA)  tablet 10 mg  10 mg Oral Daily Enzo Bi, MD   10 mg at 10/05/19 0924   donepezil (ARICEPT) tablet 10 mg  10 mg Oral QHS Enzo Bi, MD   10 mg at 10/04/19 2139   heparin injection 5,000 Units  5,000 Units Subcutaneous Q8H Hugelmeyer, Alexis, DO   5,000 Units at 10/05/19 0603   ipratropium (ATROVENT) nebulizer solution 0.5 mg  0.5 mg Nebulization Q6H PRN Hugelmeyer, Alexis, DO       mirtazapine (REMERON) tablet 15 mg  15 mg Oral QHS Enzo Bi, MD   15 mg at 10/04/19 2139   ondansetron (ZOFRAN) tablet 4 mg  4 mg Oral Q6H PRN Hugelmeyer, Alexis, DO       Or   ondansetron (ZOFRAN) injection 4 mg  4 mg Intravenous  Q6H PRN Hugelmeyer, Alexis, DO       senna-docusate (Senokot-S) tablet 1 tablet  1 tablet Oral QHS PRN Hugelmeyer, Alexis, DO       spironolactone (ALDACTONE) tablet 25 mg  25 mg Oral Daily Enzo Bi, MD   25 mg at 10/05/19 0924   traMADol (ULTRAM) tablet 50 mg  50 mg Oral Q6H PRN Enzo Bi, MD   50 mg at 10/05/19 4784     Discharge Medications: Please see discharge summary for a list of discharge medications.  Relevant Imaging Results:  Relevant Lab Results:   Additional Information SS# [id number]  Su Hilt, RN

## 2019-10-05 NOTE — Plan of Care (Signed)
  Problem: Clinical Measurements: Goal: Ability to maintain clinical measurements within normal limits will improve Outcome: Progressing Goal: Will remain free from infection Outcome: Progressing Goal: Diagnostic test results will improve Outcome: Progressing Goal: Respiratory complications will improve Outcome: Progressing Goal: Cardiovascular complication will be avoided Outcome: Progressing   Problem: Pain Managment: Goal: General experience of comfort will improve Outcome: Progressing   

## 2019-10-05 NOTE — Consult Note (Signed)
ORTHOPAEDIC CONSULTATION  REQUESTING PHYSICIAN: Enzo Bi, MD  Chief Complaint: Left superior and inferior pubic rami and sacral fracture status post fall  HPI: Becky Prince is a 86 y.o. female who was examined this morning in her hospital room.  She complains of mild to moderate left-sided low back and pelvic pain after a fall.  She explains that she has no pain at rest.  She denies any numbness or tingling.  Patient was diagnosed with minimally displaced left superior and inferior pubic rami fractures as well as the zone 2 sacral fracture on plain x-rays and CT scan.  Orthopedics is consulted for management of her fractures.  Past Medical History:  Diagnosis Date  . Depression   . Environmental allergies   . Hypercholesteremia   . Hypertension    Past Surgical History:  Procedure Laterality Date  . ABDOMINAL HYSTERECTOMY    . CATARACT EXTRACTION W/PHACO Right 06/12/2015   Procedure: CATARACT EXTRACTION PHACO AND INTRAOCULAR LENS PLACEMENT (IOC);  Surgeon: Leandrew Koyanagi, MD;  Location: [place];  Service: Ophthalmology;  Laterality: Right;   Social History   Socioeconomic History  . Marital status: Widowed    Spouse name: Not on file  . Number of children: 1  . Years of education: Not on file  . Highest education level: 11th grade  Occupational History  . Occupation: retired  Tobacco Use  . Smoking status: Never Smoker  . Smokeless tobacco: Never Used  Vaping Use  . Vaping Use: Never used  Substance and Sexual Activity  . Alcohol use: No    Alcohol/week: 0.0 standard drinks  . Drug use: No  . Sexual activity: Not on file  Other Topics Concern  . Not on file  Social History Narrative  . Not on file   Social Determinants of Health   Financial Resource Strain: Low Risk   . Difficulty of Paying Living Expenses: Not hard at all  Food Insecurity: No Food Insecurity  . Worried About Charity fundraiser in the Last Year: Never true  . Ran Out of Food  in the Last Year: Never true  Transportation Needs: No Transportation Needs  . Lack of Transportation (Medical): No  . Lack of Transportation (Non-Medical): No  Physical Activity: Inactive  . Days of Exercise per Week: 0 days  . Minutes of Exercise per Session: 0 min  Stress: No Stress Concern Present  . Feeling of Stress : Not at all  Social Connections: Socially Isolated  . Frequency of Communication with Friends and Family: More than three times a week  . Frequency of Social Gatherings with Friends and Family: Once a week  . Attends Religious Services: Never  . Active Member of Clubs or Organizations: No  . Attends Archivist Meetings: Never  . Marital Status: Widowed   Family History  Problem Relation Age of Onset  . Heart disease Mother        CHF  . Hypertension Mother   . Cancer Father        prostate  . Cancer Sister        breast  . Kidney disease Brother   . Breast cancer Neg Hx    Allergies  Allergen Reactions  . Naproxen Swelling    Had lip swelling 20 yrs ago - not sure if caused by oxycodone or naproxen  . Oxycodone-Aspirin Swelling    Had lip swelling 20 yrs ago. Not sure if caused by oxycodone or naproxen.  . Prednisone Diarrhea  Prior to Admission medications   Medication Sig Start Date End Date Taking? Authorizing Provider  amLODipine (NORVASC) 5 MG tablet Take 1 tablet (5 mg total) by mouth daily. 02/22/19  Yes Bacigalupo, [name], MD  ASPIRIN 81 PO TAKE 1 TABLET BY MOUTH EVERY DAY 12/21/18  Yes [provider]  atorvastatin (LIPITOR) 40 MG tablet TAKE 1 TABLET BY MOUTH DAILY AT 6 PM. 09/14/19  Yes Bacigalupo, [name], MD  citalopram (CELEXA) 10 MG tablet TAKE 1 TABLET BY MOUTH EVERY DAY 03/08/19  Yes Bacigalupo, [name], MD  donepezil (ARICEPT) 10 MG tablet TAKE 1 TABLET BY MOUTH EVERYDAY AT BEDTIME 09/11/19  Yes Bacigalupo, [name], MD  fluticasone (FLONASE) 50 MCG/ACT nasal spray Place 1 spray into both nostrils daily. 04/17/19   Yes Bacigalupo, [name], MD  mirtazapine (REMERON) 15 MG tablet TAKE 1 TABLET BY MOUTH EVERYDAY AT BEDTIME 07/21/19  Yes Sowles, Drue Stager, MD  spironolactone (ALDACTONE) 25 MG tablet TAKE 1 TABLET BY MOUTH EVERY DAY 09/14/19  Yes Bacigalupo, [name], MD  traMADol (ULTRAM) 50 MG tablet TAKE 1 TABLET BY MOUTH FOUR TIMES A DAY AS NEEDED 01/01/19  Yes [provider]  Multiple Vitamin (MULTI-VITAMIN) tablet Take by mouth.    [provider]   DG Chest 1 View  Result Date: 10/03/2019 CLINICAL DATA:  Preop.  Trip and fall with left hip fracture. EXAM: CHEST  1 VIEW COMPARISON:  None. FINDINGS: Borderline cardiomegaly. Normal mediastinal contours. Aortic atherosclerosis. Slight interstitial coarsening of unknown chronicity. No confluent airspace disease, pleural effusion, or pneumothorax. No acute osseous abnormalities are seen. IMPRESSION: 1. Borderline cardiomegaly.  Aortic Atherosclerosis (ICD10-I70.0). 2. Slight interstitial coarsening of unknown chronicity, likely chronic. Electronically Signed   By: Keith Rake M.D.   On: 10/03/2019 20:01   CT Abdomen Pelvis W Contrast  Result Date: 10/03/2019 CLINICAL DATA:  Trip and fall with left hip fracture. Rule out intra-abdominal/pelvic bleeding. EXAM: CT ABDOMEN AND PELVIS WITH CONTRAST TECHNIQUE: Multidetector CT imaging of the abdomen and pelvis was performed using the standard protocol following bolus administration of intravenous contrast. CONTRAST:  72mL OMNIPAQUE IOHEXOL 300 MG/ML  SOLN COMPARISON:  Hip radiograph earlier today. FINDINGS: Lower chest: Mild cardiomegaly. No pleural fluid or basilar airspace disease. Hepatobiliary: Scattered hepatic cysts as well as low-density lesions too small to accurately characterize. No evidence of suspicious lesion. No hepatic injury or perihepatic hematoma. Gallbladder physiologically distended, no calcified stone. No biliary dilatation. Pancreas: No ductal dilatation or inflammation. Spleen: Normal  in size. No evidence of injury or perisplenic hematoma. There is splenic artery tortuosity which is densely calcified. Adrenals/Urinary Tract: No adrenal hemorrhage or nodule. No evidence of renal injury. Mild bilateral renal parenchymal thinning. Simple cyst in the lower right kidney. Low-density lesion in the mid left kidney that is too small to accurately characterize. Urinary bladder is unremarkable. No evidence of bladder injury. Stomach/Bowel: Nondistended stomach. Small duodenal diverticulum. No evidence of small bowel obstruction, inflammation, or bowel injury. No mesenteric hematoma. Moderate volume of stool throughout the colon. No colonic wall thickening or evidence of injury. Findings not definitively visualized, no appendicitis. Vascular/Lymphatic: No vascular injury. Abdominal aorta is normal in caliber. Moderate aortic atherosclerosis. Aortic tortuosity. There is bi-iliac atherosclerosis and tortuosity. No evidence of active bleeding related to left pubic rami fractures. No adenopathy. Reproductive: Status post hysterectomy. No adnexal masses. Other: No free fluid or free air in the abdomen or pelvis. Musculoskeletal: Left superior and inferior pubic rami fractures, mildly displaced and comminuted. Superior image  fracture extends to the pubic body but not definitively the pubic symphysis. There is no acetabular involvement. No associated pelvic hematoma. There is a nondisplaced zone 2 sacral fracture. Scoliotic curvature in the lumbar spine with multilevel degenerative change. No acute lumbar no fracture of included lower ribs. Fracture. IMPRESSION: 1. Left superior and inferior pubic rami fractures, mildly displaced and comminuted. No associated pelvic hematoma. 2. Nondisplaced zone 2 sacral fracture. 3. No additional acute traumatic injury to the abdomen or pelvis. Aortic Atherosclerosis (ICD10-I70.0). Electronically Signed   By: Keith Rake M.D.   On: 10/03/2019 20:13   DG Hip Unilat With  Pelvis 2-3 Views Left  Result Date: 10/03/2019 CLINICAL DATA:  Left hip pain after trip and fall today. Initial encounter. EXAM: DG HIP (WITH OR WITHOUT PELVIS) 2-3V LEFT COMPARISON:  None. FINDINGS: The patient has acute left superior and inferior pubic ramus fractures. No other fracture is identified. In particular, no hip fracture is seen. Both hips are located. There is some chondrocalcinosis about the hips. Convex left lumbar scoliosis and degenerative change noted. IMPRESSION: Acute left superior and inferior pubic ramus fractures. Negative for hip fracture. Electronically Signed   By: Inge Rise M.D.   On: 10/03/2019 19:24    Positive ROS: All other systems have been reviewed and were otherwise negative with the exception of those mentioned in the HPI and as above.  Physical Exam: General: Alert, no acute distress  MUSCULOSKELETAL: Left lower extremity: Patient demonstrates no shortening or external rotation of the left lower extremity.  She has palpable pedal pulses, intact sensation light touch and intact motor function in the left lower extremity.  Patient can actively flex her hip and knee to approximately 90 degrees with mild to moderate pain.  There is no significant swelling or deformity.    Assessment: Minimally displaced superior and inferior pubic rami and sacral fractures  Plan: Patient will not require surgical intervention for her fractures.  She understands that it may take up to 3 months to heal the above-noted fractures.  Patient may weight-bear as tolerated on the left lower extremity.  I recommend she use a walker for assistance with ambulation initially until her pain improves.  Please continue with PT evaluation.  Patient may require a skilled nursing facility if she is struggling to weight-bear or get out of bed independently.   Thornton Park, MD    10/05/2019 8:23 AM

## 2019-10-05 NOTE — TOC Progression Note (Signed)
Transition of Care [facility]) - Progression Note    Patient Details  Name: Becky Prince MRN: [id number] Date of Birth: June 17, 1925  Transition of Care [facility]) CM/SW [place], RN Phone Number: 10/05/2019, 12:43 PM  Clinical Narrative:    Damaris Schooner with Seth Bake at [facility] and she has one bed but wont be ready uintil tomorrow, the patient and her family would like to accept the one bed, I called Seth Bake and let her know I called [facility] and started EMS prior auth approval for first choice as well as Prior approval for SNF. I provided all information and anticipate approval        Expected Discharge Plan and Services                                                 Social Determinants of Health (SDOH) Interventions    Readmission Risk Interventions No flowsheet data found.

## 2019-10-06 DIAGNOSIS — I10 Essential (primary) hypertension: Secondary | ICD-10-CM | POA: Diagnosis not present

## 2019-10-06 DIAGNOSIS — S3282XD Multiple fractures of pelvis without disruption of pelvic ring, subsequent encounter for fracture with routine healing: Secondary | ICD-10-CM | POA: Diagnosis not present

## 2019-10-06 DIAGNOSIS — F0390 Unspecified dementia without behavioral disturbance: Secondary | ICD-10-CM | POA: Diagnosis not present

## 2019-10-06 DIAGNOSIS — F329 Major depressive disorder, single episode, unspecified: Secondary | ICD-10-CM | POA: Diagnosis not present

## 2019-10-06 DIAGNOSIS — M810 Age-related osteoporosis without current pathological fracture: Secondary | ICD-10-CM | POA: Diagnosis not present

## 2019-10-06 DIAGNOSIS — F0150 Vascular dementia without behavioral disturbance: Secondary | ICD-10-CM | POA: Diagnosis not present

## 2019-10-06 DIAGNOSIS — S32512A Fracture of superior rim of left pubis, initial encounter for closed fracture: Secondary | ICD-10-CM | POA: Diagnosis not present

## 2019-10-06 DIAGNOSIS — B351 Tinea unguium: Secondary | ICD-10-CM | POA: Diagnosis not present

## 2019-10-06 DIAGNOSIS — R262 Difficulty in walking, not elsewhere classified: Secondary | ICD-10-CM | POA: Diagnosis not present

## 2019-10-06 DIAGNOSIS — R279 Unspecified lack of coordination: Secondary | ICD-10-CM | POA: Diagnosis not present

## 2019-10-06 DIAGNOSIS — Z8673 Personal history of transient ischemic attack (TIA), and cerebral infarction without residual deficits: Secondary | ICD-10-CM | POA: Diagnosis not present

## 2019-10-06 DIAGNOSIS — S3289XA Fracture of other parts of pelvis, initial encounter for closed fracture: Secondary | ICD-10-CM | POA: Diagnosis not present

## 2019-10-06 DIAGNOSIS — J309 Allergic rhinitis, unspecified: Secondary | ICD-10-CM | POA: Diagnosis not present

## 2019-10-06 DIAGNOSIS — R278 Other lack of coordination: Secondary | ICD-10-CM | POA: Diagnosis not present

## 2019-10-06 DIAGNOSIS — Z741 Need for assistance with personal care: Secondary | ICD-10-CM | POA: Diagnosis not present

## 2019-10-06 DIAGNOSIS — R2681 Unsteadiness on feet: Secondary | ICD-10-CM | POA: Diagnosis not present

## 2019-10-06 DIAGNOSIS — S32591A Other specified fracture of right pubis, initial encounter for closed fracture: Secondary | ICD-10-CM | POA: Diagnosis not present

## 2019-10-06 DIAGNOSIS — F4320 Adjustment disorder, unspecified: Secondary | ICD-10-CM | POA: Diagnosis not present

## 2019-10-06 DIAGNOSIS — N183 Chronic kidney disease, stage 3 unspecified: Secondary | ICD-10-CM | POA: Diagnosis not present

## 2019-10-06 DIAGNOSIS — S32502A Unspecified fracture of left pubis, initial encounter for closed fracture: Secondary | ICD-10-CM | POA: Diagnosis not present

## 2019-10-06 DIAGNOSIS — R4189 Other symptoms and signs involving cognitive functions and awareness: Secondary | ICD-10-CM | POA: Diagnosis not present

## 2019-10-06 DIAGNOSIS — S32592A Other specified fracture of left pubis, initial encounter for closed fracture: Secondary | ICD-10-CM | POA: Diagnosis not present

## 2019-10-06 DIAGNOSIS — R5381 Other malaise: Secondary | ICD-10-CM | POA: Diagnosis not present

## 2019-10-06 DIAGNOSIS — W19XXXD Unspecified fall, subsequent encounter: Secondary | ICD-10-CM | POA: Diagnosis not present

## 2019-10-06 DIAGNOSIS — F39 Unspecified mood [affective] disorder: Secondary | ICD-10-CM | POA: Diagnosis not present

## 2019-10-06 DIAGNOSIS — M6281 Muscle weakness (generalized): Secondary | ICD-10-CM | POA: Diagnosis not present

## 2019-10-06 DIAGNOSIS — E7800 Pure hypercholesterolemia, unspecified: Secondary | ICD-10-CM | POA: Diagnosis not present

## 2019-10-06 DIAGNOSIS — E785 Hyperlipidemia, unspecified: Secondary | ICD-10-CM | POA: Diagnosis not present

## 2019-10-06 DIAGNOSIS — I129 Hypertensive chronic kidney disease with stage 1 through stage 4 chronic kidney disease, or unspecified chronic kidney disease: Secondary | ICD-10-CM | POA: Diagnosis not present

## 2019-10-06 DIAGNOSIS — S329XXA Fracture of unspecified parts of lumbosacral spine and pelvis, initial encounter for closed fracture: Secondary | ICD-10-CM | POA: Diagnosis not present

## 2019-10-06 LAB — CBC
HCT: 39.3 % (ref 36.0–46.0)
Hemoglobin: 12.6 g/dL (ref 12.0–15.0)
MCH: 31.4 pg (ref 26.0–34.0)
MCHC: 32.1 g/dL (ref 30.0–36.0)
MCV: 98 fL (ref 80.0–100.0)
Platelets: 190 10*3/uL (ref 150–400)
RBC: 4.01 MIL/uL (ref 3.87–5.11)
RDW: 12 % (ref 11.5–15.5)
WBC: 7.4 10*3/uL (ref 4.0–10.5)
nRBC: 0 % (ref 0.0–0.2)

## 2019-10-06 LAB — BASIC METABOLIC PANEL
Anion gap: 7 (ref 5–15)
BUN: 28 mg/dL — ABNORMAL HIGH (ref 8–23)
CO2: 27 mmol/L (ref 22–32)
Calcium: 8.6 mg/dL — ABNORMAL LOW (ref 8.9–10.3)
Chloride: 102 mmol/L (ref 98–111)
Creatinine, Ser: 1.42 mg/dL — ABNORMAL HIGH (ref 0.44–1.00)
GFR calc Af Amer: 37 mL/min — ABNORMAL LOW (ref >60)
GFR calc non Af Amer: 32 mL/min — ABNORMAL LOW (ref >60)
Glucose, Bld: 105 mg/dL — ABNORMAL HIGH (ref 70–99)
Potassium: 4.4 mmol/L (ref 3.5–5.1)
Sodium: 136 mmol/L (ref 135–145)

## 2019-10-06 LAB — MAGNESIUM: Magnesium: 1.9 mg/dL (ref 1.7–2.4)

## 2019-10-06 MED ORDER — ACETAMINOPHEN 500 MG PO TABS: 1000.0000 mg | ORAL_TABLET | Freq: Three times a day (TID) | ORAL | Status: DC | PRN

## 2019-10-06 MED ORDER — BISACODYL 5 MG PO TBEC: 5.0000 mg | DELAYED_RELEASE_TABLET | Freq: Every day | ORAL | 0 refills | Status: DC | PRN

## 2019-10-06 MED ORDER — TRAMADOL HCL 50 MG PO TABS: 50.0000 mg | ORAL_TABLET | Freq: Four times a day (QID) | ORAL | 0 refills | Status: AC | PRN

## 2019-10-06 MED ORDER — SENNOSIDES-DOCUSATE SODIUM 8.6-50 MG PO TABS: 1.0000 | ORAL_TABLET | Freq: Every evening | ORAL | Status: DC | PRN

## 2019-10-06 NOTE — TOC Transition Note (Signed)
Transition of Care [facility]) - CM/SW Discharge Note   Patient Details  Name: KEILEY LEVEY MRN: [id number] Date of Birth: [birth year]  Transition of Care [facility]) CM/SW Contact:  Su Hilt, RN Phone Number: 10/06/2019, 11:23 AM   Clinical Narrative:     Patient to DC to [facility] today Bedside nurse has called report, DC packet completed, info sent thru [facility],  [facility] will be here at 1215 to transport, family and patient aware  Final next level of care: Skilled Nursing Facility Barriers to Discharge: Barriers Resolved   Patient Goals and CMS Choice        Discharge Placement              Patient chooses bed at: [facility] Patient to be transferred to facility by: [facility] Name of family member notified: Helene Kelp Patient and family notified of of transfer: 10/06/19  Discharge Plan and Services                                     Social Determinants of Health (SDOH) Interventions     Readmission Risk Interventions No flowsheet data found.

## 2019-10-06 NOTE — Progress Notes (Signed)
Physical Therapy Treatment Patient Details Name: Becky Prince MRN: [id number] DOB: 09-Nov-1925 Today's Date: 10/06/2019    History of Present Illness Pt admitted for L pubic rami fractures and a nondisplaced sacral fracture. Pt with complaints of fall on L hip. Ortho cleared for PT evaluation, WBAT on L LE>    PT Comments    Pt is making good progress towards goals and is hoping to be discharged this date. Still reports pain with all mobility and takes significant assist for mobility efforts. Good endurance with there-ex. Will continue to progress as able.   Follow Up Recommendations  SNF     Equipment Recommendations       Recommendations for Other Services       Precautions / Restrictions Precautions Precautions: Fall Restrictions Weight Bearing Restrictions: Yes LLE Weight Bearing: Weight bearing as tolerated    Mobility  Bed Mobility Overal bed mobility: Needs Assistance Bed Mobility: Supine to Sit     Supine to sit: Mod assist     General bed mobility comments: needs assist for L LE. Heavy cues for sequencing. Once seated at EOB, able to sit with upright posture  Transfers Overall transfer level: Needs assistance Equipment used: Rolling walker (2 wheeled) Transfers: Sit to/from Stand Sit to Stand: Mod assist         General transfer comment: cues for hand placement. Once standing, post leaning, however able to self correct with cues  Ambulation/Gait Ambulation/Gait assistance: Min assist;Mod assist Gait Distance (Feet): 3 Feet Assistive device: Rolling walker (2 wheeled) Gait Pattern/deviations: Step-to pattern Gait velocity: '   General Gait Details: able to ambulate few steps to [facility]. short step to gait pattern   Stairs             Wheelchair Mobility    Modified Rankin (Stroke Patients Only)       Balance Overall balance assessment: Needs assistance Sitting-balance support: Feet supported Sitting balance-Leahy Scale: Good      Standing balance support: Bilateral upper extremity supported Standing balance-Leahy Scale: Fair Standing balance comment: heavy B UE assist on RW                            Cognition Arousal/Alertness: Awake/alert Behavior During Therapy: WFL for tasks assessed/performed Overall Cognitive Status: Within Functional Limits for tasks assessed                                        Exercises Other Exercises Other Exercises: B LE ther-ex performed with min assist including QS, AP, SLRs, hip abd/add, and heel slides. All ther-ex performed x 12 reps with cues for sequencing Other Exercises: Min/mod assist for transfer to [facility]. Needs min assist for hygiene.    General Comments        Pertinent Vitals/Pain Pain Assessment: Faces Faces Pain Scale: Hurts even more Pain Location: L hip rest Pain Descriptors / Indicators: Aching Pain Intervention(s): Limited activity within patient's tolerance;Repositioned    Home Living                      Prior Function            PT Goals (current goals can now be found in the care plan section) Acute Rehab PT Goals Patient Stated Goal: to get better PT Goal Formulation: With patient Time For Goal Achievement: 10/18/19 Potential  to Achieve Goals: Good Progress towards PT goals: Progressing toward goals    Frequency    7X/week      PT Plan Current plan remains appropriate    Co-evaluation              AM-PAC PT "6 Clicks" Mobility   Outcome Measure  Help needed turning from your back to your side while in a flat bed without using bedrails?: A Little Help needed moving from lying on your back to sitting on the side of a flat bed without using bedrails?: A Little Help needed moving to and from a bed to a chair (including a wheelchair)?: A Lot Help needed standing up from a chair using your arms (e.g., wheelchair or bedside chair)?: A Little Help needed to walk in hospital room?: A Lot Help  needed climbing 3-5 steps with a railing? : Total 6 Click Score: 14    End of Session Equipment Utilized During Treatment: Gait belt Activity Tolerance: Patient tolerated treatment well Patient left: in chair;with chair alarm set;with call bell/phone within reach;with family/visitor present Nurse Communication: Mobility status PT Visit Diagnosis: Muscle weakness (generalized) (M62.81);Difficulty in walking, not elsewhere classified (R26.2);Pain Pain - Right/Left: Left Pain - part of body: Hip     Time: 1219-7588 PT Time Calculation (min) (ACUTE ONLY): 23 min  Charges:  $Therapeutic Exercise: 8-22 mins $Therapeutic Activity: 8-22 mins                     Greggory Stallion, PT, DPT [phone]    Becky Prince 10/06/2019, 10:47 AM

## 2019-10-06 NOTE — Care Management Obs Status (Signed)
Advance NOTIFICATION   Patient Details  Name: Becky Prince MRN: [id number] Date of Birth: 03/17/1926   Medicare Observation Status Notification Given:  Yes    Lory Nowaczyk Jen Mow, RN 10/06/2019, 12:04 PM

## 2019-10-06 NOTE — TOC Progression Note (Signed)
Transition of Care [facility]) - Progression Note    Patient Details  Name: Becky Prince MRN: [id number] Date of Birth: 05/29/1925  Transition of Care [facility]) CM/SW Contact  Su Hilt, RN Phone Number: 10/06/2019, 9:36 AM  Clinical Narrative:    Received a call from [place] at [facility], they have approved the SNF and ambulance  Auth for SNF approval [id number] EMS [facility] auth number [id number], made patient and family aware, Seth Bake at [facility] is aware and ready to accept the patient        Expected Discharge Plan and Services                                                 Social Determinants of Health (SDOH) Interventions    Readmission Risk Interventions No flowsheet data found.

## 2019-10-06 NOTE — Progress Notes (Signed)
Report has been called to nurse Janett Billow at [facility]  Will inform CM to call EMS.

## 2019-10-06 NOTE — Plan of Care (Signed)
  Problem: Clinical Measurements: Goal: Ability to maintain clinical measurements within normal limits will improve Outcome: Progressing Goal: Will remain free from infection Outcome: Progressing Goal: Diagnostic test results will improve Outcome: Progressing Goal: Respiratory complications will improve Outcome: Progressing Goal: Cardiovascular complication will be avoided Outcome: Progressing   Problem: Pain Managment: Goal: General experience of comfort will improve Outcome: Progressing   

## 2019-10-06 NOTE — Discharge Summary (Signed)
Physician Discharge Summary   Becky Prince  female DOB: [birth year]  DPO:[id number]  PCP: Becky Crews, MD  Admit date: 10/03/2019 Discharge date: 10/06/2019  Admitted From: home Disposition:  SNF CODE STATUS: Full code   Hospital Course:  For full details, please see H&P, progress notes, consult notes and ancillary notes.  Briefly,  BettyBoggsis a 86 y.o.Caucasian femalewith a known history of hypertension, hyperlipidemia, osteoporosis, dementia, depression and allergieswho presented to the emergency department for evaluation of left hip pain following a mechanical fall.   #.Left superior and inferior pubic rami fractures No surgical intervention needed, per ortho Dr. Mack Prince.  Pain was controlled with just PRN Tramadol.  PT evaluation rec SNF rehab.  Per ortho, "it may take up to 3 months to heal the above-noted fractures.  Patient may weight-bear as tolerated on the left lower extremity."  #.History of hypertension Continued Norvasc and spironolactone  #.History of hyperlipidemia Continued Lipitor  #. History ofdepression, dementia ContinuedCelexa, Aricept, Remeron  #. History ofallergies ContinuedFlonase   Discharge Diagnoses:  Active Problems:   Bilateral pubic rami fractures (HCC)   Fracture of multiple pubic rami, left, closed, initial encounter [facility])    Discharge Instructions:  Allergies as of 10/06/2019      Reactions   Naproxen Swelling   Had lip swelling 20 yrs ago - not sure if caused by oxycodone or naproxen   Oxycodone-aspirin Swelling   Had lip swelling 20 yrs ago. Not sure if caused by oxycodone or naproxen.   Prednisone Diarrhea      Medication List    TAKE these medications   acetaminophen 500 MG tablet Commonly known as: TYLENOL Take 2 tablets (1,000 mg total) by mouth 3 (three) times daily as needed for mild pain (or Fever >/= 101).   amLODipine 5 MG tablet Commonly known as: NORVASC Take 1 tablet (5 mg  total) by mouth daily.   ASPIRIN 81 PO TAKE 1 TABLET BY MOUTH EVERY DAY   atorvastatin 40 MG tablet Commonly known as: LIPITOR TAKE 1 TABLET BY MOUTH DAILY AT 6 PM.   bisacodyl 5 MG EC tablet Commonly known as: DULCOLAX Take 1 tablet (5 mg total) by mouth daily as needed for moderate constipation.   citalopram 10 MG tablet Commonly known as: CELEXA TAKE 1 TABLET BY MOUTH EVERY DAY   donepezil 10 MG tablet Commonly known as: ARICEPT TAKE 1 TABLET BY MOUTH EVERYDAY AT BEDTIME   fluticasone 50 MCG/ACT nasal spray Commonly known as: FLONASE Place 1 spray into both nostrils daily.   mirtazapine 15 MG tablet Commonly known as: REMERON TAKE 1 TABLET BY MOUTH EVERYDAY AT BEDTIME   Multi-Vitamin tablet Take by mouth.   senna-docusate 8.6-50 MG tablet Commonly known as: Senokot-S Take 1 tablet by mouth at bedtime as needed for mild constipation.   spironolactone 25 MG tablet Commonly known as: ALDACTONE TAKE 1 TABLET BY MOUTH EVERY DAY   traMADol 50 MG tablet Commonly known as: ULTRAM Take 1 tablet (50 mg total) by mouth every 6 (six) hours as needed for up to 7 days for moderate pain. What changed: See the new instructions.        Contact information for follow-up providers    Becky Crews, MD. Schedule an appointment as soon as possible for a visit in 1 week(s).   Specialty: Family Medicine Contact information: [address] [place] [address] [phone]            Contact information for after-discharge  care    Destination    [facility] .   Service: Skilled Nursing Contact information: [place] [address] [phone]                  Allergies  Allergen Reactions  . Naproxen Swelling    Had lip swelling 20 yrs ago - not sure if caused by oxycodone or naproxen  . Oxycodone-Aspirin Swelling    Had lip swelling 20 yrs ago. Not sure if caused by oxycodone or naproxen.   . Prednisone Diarrhea     The results of significant diagnostics from this hospitalization (including imaging, microbiology, ancillary and laboratory) are listed below for reference.   Consultations:   Procedures/Studies: DG Chest 1 View  Result Date: 10/03/2019 CLINICAL DATA:  Preop.  Trip and fall with left hip fracture. EXAM: CHEST  1 VIEW COMPARISON:  None. FINDINGS: Borderline cardiomegaly. Normal mediastinal contours. Aortic atherosclerosis. Slight interstitial coarsening of unknown chronicity. No confluent airspace disease, pleural effusion, or pneumothorax. No acute osseous abnormalities are seen. IMPRESSION: 1. Borderline cardiomegaly.  Aortic Atherosclerosis (ICD10-I70.0). 2. Slight interstitial coarsening of unknown chronicity, likely chronic. Electronically Signed   By: Becky Prince M.D.   On: 10/03/2019 20:01   CT Abdomen Pelvis W Contrast  Result Date: 10/03/2019 CLINICAL DATA:  Trip and fall with left hip fracture. Rule out intra-abdominal/pelvic bleeding. EXAM: CT ABDOMEN AND PELVIS WITH CONTRAST TECHNIQUE: Multidetector CT imaging of the abdomen and pelvis was performed using the standard protocol following bolus administration of intravenous contrast. CONTRAST:  10mL OMNIPAQUE IOHEXOL 300 MG/ML  SOLN COMPARISON:  Hip radiograph earlier today. FINDINGS: Lower chest: Mild cardiomegaly. No pleural fluid or basilar airspace disease. Hepatobiliary: Scattered hepatic cysts as well as low-density lesions too small to accurately characterize. No evidence of suspicious lesion. No hepatic injury or perihepatic hematoma. Gallbladder physiologically distended, no calcified stone. No biliary dilatation. Pancreas: No ductal dilatation or inflammation. Spleen: Normal in size. No evidence of injury or perisplenic hematoma. There is splenic artery tortuosity which is densely calcified. Adrenals/Urinary Tract: No adrenal hemorrhage or nodule. No evidence of renal injury. Mild bilateral renal  parenchymal thinning. Simple cyst in the lower right kidney. Low-density lesion in the mid left kidney that is too small to accurately characterize. Urinary bladder is unremarkable. No evidence of bladder injury. Stomach/Bowel: Nondistended stomach. Small duodenal diverticulum. No evidence of small bowel obstruction, inflammation, or bowel injury. No mesenteric hematoma. Moderate volume of stool throughout the colon. No colonic wall thickening or evidence of injury. Findings not definitively visualized, no appendicitis. Vascular/Lymphatic: No vascular injury. Abdominal aorta is normal in caliber. Moderate aortic atherosclerosis. Aortic tortuosity. There is bi-iliac atherosclerosis and tortuosity. No evidence of active bleeding related to left pubic rami fractures. No adenopathy. Reproductive: Status post hysterectomy. No adnexal masses. Other: No free fluid or free air in the abdomen or pelvis. Musculoskeletal: Left superior and inferior pubic rami fractures, mildly displaced and comminuted. Superior image fracture extends to the pubic body but not definitively the pubic symphysis. There is no acetabular involvement. No associated pelvic hematoma. There is a nondisplaced zone 2 sacral fracture. Scoliotic curvature in the lumbar spine with multilevel degenerative change. No acute lumbar no fracture of included lower ribs. Fracture. IMPRESSION: 1. Left superior and inferior pubic rami fractures, mildly displaced and comminuted. No associated pelvic hematoma. 2. Nondisplaced zone 2 sacral fracture. 3. No additional acute traumatic injury to the abdomen or pelvis. Aortic Atherosclerosis (ICD10-I70.0). Electronically Signed   By:  Becky Prince M.D.   On: 10/03/2019 20:13   DG Hip Unilat With Pelvis 2-3 Views Left  Result Date: 10/03/2019 CLINICAL DATA:  Left hip pain after trip and fall today. Initial encounter. EXAM: DG HIP (WITH OR WITHOUT PELVIS) 2-3V LEFT COMPARISON:  None. FINDINGS: The patient has acute left  superior and inferior pubic ramus fractures. No other fracture is identified. In particular, no hip fracture is seen. Both hips are located. There is some chondrocalcinosis about the hips. Convex left lumbar scoliosis and degenerative change noted. IMPRESSION: Acute left superior and inferior pubic ramus fractures. Negative for hip fracture. Electronically Signed   By: Inge Rise M.D.   On: 10/03/2019 19:24      Labs: BNP (last 3 results) No results for input(s): BNP in the last 8760 hours. Basic Metabolic Panel: Recent Labs  Lab 10/03/19 1828 10/04/19 0547 10/05/19 0349 10/06/19 0443  NA 138 137 138 136  K 4.7 4.3 4.0 4.4  CL 102 102 103 102  CO2 28 26 30 27   GLUCOSE 108* 100* 89 105*  BUN 28* 25* 19 28*  CREATININE 1.47* 1.38* 1.31* 1.42*  CALCIUM 8.7* 8.7* 8.7* 8.6*  MG  --  1.7 1.8 1.9  PHOS  --  2.4*  --   --    Liver Function Tests: Recent Labs  Lab 10/03/19 1828  AST 24  ALT 18  ALKPHOS 119  BILITOT 0.8  PROT 7.3  ALBUMIN 4.0   No results for input(s): LIPASE, AMYLASE in the last 168 hours. No results for input(s): AMMONIA in the last 168 hours. CBC: Recent Labs  Lab 10/03/19 1828 10/04/19 0547 10/05/19 0349 10/06/19 0443  WBC 11.2* 6.9 6.7 7.4  NEUTROABS 8.4*  --   --   --   HGB 13.7 12.1 12.5 12.6  HCT 42.2 36.6 38.0 39.3  MCV 97.9 94.1 96.9 98.0  PLT 238 197 201 190   Cardiac Enzymes: No results for input(s): CKTOTAL, CKMB, CKMBINDEX, TROPONINI in the last 168 hours. BNP: Invalid input(s): POCBNP CBG: No results for input(s): GLUCAP in the last 168 hours. D-Dimer No results for input(s): DDIMER in the last 72 hours. Hgb A1c No results for input(s): HGBA1C in the last 72 hours. Lipid Profile No results for input(s): CHOL, HDL, LDLCALC, TRIG, CHOLHDL, LDLDIRECT in the last 72 hours. Thyroid function studies No results for input(s): TSH, T4TOTAL, T3FREE, THYROIDAB in the last 72 hours.  Invalid input(s): FREET3 Anemia work up No  results for input(s): VITAMINB12, FOLATE, FERRITIN, TIBC, IRON, RETICCTPCT in the last 72 hours. Urinalysis    Component Value Date/Time   COLORURINE STRAW (A) 10/03/2019 2206   APPEARANCEUR CLEAR (A) 10/03/2019 2206   LABSPEC 1.023 10/03/2019 2206   PHURINE 7.0 10/03/2019 2206   GLUCOSEU NEGATIVE 10/03/2019 2206   HGBUR NEGATIVE 10/03/2019 2206   BILIRUBINUR NEGATIVE 10/03/2019 2206   BILIRUBINUR negative 03/26/2017 1616   KETONESUR NEGATIVE 10/03/2019 2206   PROTEINUR NEGATIVE 10/03/2019 2206   UROBILINOGEN 0.2 03/26/2017 1616   NITRITE NEGATIVE 10/03/2019 2206   LEUKOCYTESUR NEGATIVE 10/03/2019 2206   Sepsis Labs Invalid input(s): PROCALCITONIN,  WBC,  LACTICIDVEN Microbiology Recent Results (from the past 240 hour(s))  SARS Coronavirus 2 by RT PCR (hospital order, performed in [place] hospital lab) Nasopharyngeal Nasopharyngeal Swab     Status: None   Collection Time: 10/03/19  7:35 PM   Specimen: Nasopharyngeal Swab  Result Value Ref Range Status   SARS Coronavirus 2 NEGATIVE NEGATIVE Final  Comment: (NOTE) SARS-CoV-2 target nucleic acids are NOT DETECTED. The SARS-CoV-2 RNA is generally detectable in upper and lower respiratory specimens during the acute phase of infection. The lowest concentration of SARS-CoV-2 viral copies this assay can detect is 250 copies / mL. A negative result does not preclude SARS-CoV-2 infection and should not be used as the sole basis for treatment or other patient management decisions.  A negative result may occur with improper specimen collection / handling, submission of specimen other than nasopharyngeal swab, presence of viral mutation(s) within the areas targeted by this assay, and inadequate number of viral copies (<250 copies / mL). A negative result must be combined with clinical observations, patient history, and epidemiological information. Fact Sheet for Patients:   StrictlyIdeas.no Fact Sheet for  Healthcare Providers: BankingDealers.co.za This test is not yet approved or cleared  by the [place] FDA and has been authorized for detection and/or diagnosis of SARS-CoV-2 by FDA under an Emergency Use Authorization (EUA).  This EUA will remain in effect (meaning this test can be used) for the duration of the COVID-19 declaration under Section 564(b)(1) of the Act, 21 U.S.C. section 360bbb-3(b)(1), unless the authorization is terminated or revoked sooner. Performed at [facility], [address] [place], [place] [address]      Total time spend on discharging this patient, including the last patient exam, discussing the hospital stay, instructions for ongoing care as it relates to all pertinent caregivers, as well as preparing the medical discharge records, prescriptions, and/or referrals as applicable, is 30 minutes.    Enzo Bi, MD  [facility] 10/06/2019, 9:42 AM  If 7PM-7AM, please contact night-coverage

## 2019-10-06 NOTE — Progress Notes (Signed)
Pt d/c to [facility] via EMS.  NAD noted at d/c IV removed from pts L forearm without issue.  All belongings taken at time of d/c.

## 2019-10-09 DIAGNOSIS — F0150 Vascular dementia without behavioral disturbance: Secondary | ICD-10-CM | POA: Diagnosis not present

## 2019-10-09 DIAGNOSIS — F39 Unspecified mood [affective] disorder: Secondary | ICD-10-CM | POA: Diagnosis not present

## 2019-10-09 DIAGNOSIS — S32502A Unspecified fracture of left pubis, initial encounter for closed fracture: Secondary | ICD-10-CM | POA: Diagnosis not present

## 2019-10-09 DIAGNOSIS — I10 Essential (primary) hypertension: Secondary | ICD-10-CM | POA: Diagnosis not present

## 2019-10-13 DIAGNOSIS — B351 Tinea unguium: Secondary | ICD-10-CM

## 2019-10-26 DIAGNOSIS — S3289XA Fracture of other parts of pelvis, initial encounter for closed fracture: Secondary | ICD-10-CM | POA: Diagnosis not present

## 2019-10-26 DIAGNOSIS — F0150 Vascular dementia without behavioral disturbance: Secondary | ICD-10-CM | POA: Diagnosis not present

## 2019-10-27 ENCOUNTER — Telehealth: Payer: Self-pay | Admitting: Family Medicine

## 2019-10-27 DIAGNOSIS — E785 Hyperlipidemia, unspecified: Secondary | ICD-10-CM | POA: Diagnosis not present

## 2019-10-27 DIAGNOSIS — N183 Chronic kidney disease, stage 3 unspecified: Secondary | ICD-10-CM | POA: Diagnosis not present

## 2019-10-27 DIAGNOSIS — E7800 Pure hypercholesterolemia, unspecified: Secondary | ICD-10-CM | POA: Diagnosis not present

## 2019-10-27 DIAGNOSIS — M720 Palmar fascial fibromatosis [Dupuytren]: Secondary | ICD-10-CM | POA: Diagnosis not present

## 2019-10-27 DIAGNOSIS — F4320 Adjustment disorder, unspecified: Secondary | ICD-10-CM | POA: Diagnosis not present

## 2019-10-27 DIAGNOSIS — I129 Hypertensive chronic kidney disease with stage 1 through stage 4 chronic kidney disease, or unspecified chronic kidney disease: Secondary | ICD-10-CM | POA: Diagnosis not present

## 2019-10-27 DIAGNOSIS — K5900 Constipation, unspecified: Secondary | ICD-10-CM | POA: Diagnosis not present

## 2019-10-27 DIAGNOSIS — F0280 Dementia in other diseases classified elsewhere without behavioral disturbance: Secondary | ICD-10-CM | POA: Diagnosis not present

## 2019-10-27 DIAGNOSIS — M800AXD Age-related osteoporosis with current pathological fracture, other site, subsequent encounter for fracture with routine healing: Secondary | ICD-10-CM | POA: Diagnosis not present

## 2019-10-27 DIAGNOSIS — F0150 Vascular dementia without behavioral disturbance: Secondary | ICD-10-CM | POA: Diagnosis not present

## 2019-10-27 DIAGNOSIS — G309 Alzheimer's disease, unspecified: Secondary | ICD-10-CM | POA: Diagnosis not present

## 2019-10-27 DIAGNOSIS — J302 Other seasonal allergic rhinitis: Secondary | ICD-10-CM | POA: Diagnosis not present

## 2019-10-27 DIAGNOSIS — E042 Nontoxic multinodular goiter: Secondary | ICD-10-CM | POA: Diagnosis not present

## 2019-10-27 NOTE — Telephone Encounter (Signed)
Copied from [place] [phone]. Topic: Quick Communication - Home Health Verbal Orders >> Oct 27, 2019  3:24 PM Jodie Echevaria wrote: Caller/Agency: Gerald Stabs / [place] Number: [phone] Requesting OT/PT/Skilled Nursing/Social Work/Speech Therapy: PT  Frequency: 2x w 3, 1x w 4 ws

## 2019-10-27 NOTE — Telephone Encounter (Signed)
Verbal ok for orders requested

## 2019-10-27 NOTE — Telephone Encounter (Signed)
Verbal orders given to Chris 

## 2019-11-02 ENCOUNTER — Inpatient Hospital Stay: Payer: PPO | Admitting: Family Medicine

## 2019-11-08 ENCOUNTER — Inpatient Hospital Stay: Payer: PPO | Admitting: Family Medicine

## 2019-11-21 DIAGNOSIS — M48062 Spinal stenosis, lumbar region with neurogenic claudication: Secondary | ICD-10-CM | POA: Diagnosis not present

## 2019-11-21 DIAGNOSIS — M5416 Radiculopathy, lumbar region: Secondary | ICD-10-CM | POA: Diagnosis not present

## 2019-11-21 DIAGNOSIS — M5136 Other intervertebral disc degeneration, lumbar region: Secondary | ICD-10-CM | POA: Diagnosis not present

## 2019-11-22 ENCOUNTER — Telehealth: Payer: Self-pay | Admitting: Family Medicine

## 2019-11-22 ENCOUNTER — Ambulatory Visit: Payer: Self-pay

## 2019-11-22 ENCOUNTER — Telehealth: Payer: Self-pay

## 2019-11-22 NOTE — Telephone Encounter (Signed)
Copied from [place] [phone]. Topic: General - Other >> Nov 22, 2019 10:58 AM Hinda Lenis D wrote: PT needs to go over her medication list and compare with the one from [place], she ask to speak with a nurse / please advise

## 2019-11-22 NOTE — Telephone Encounter (Signed)
Agent tried to transfer call. Call dropped. Reports pt. Wanted to review her medications. Attempted to reach pt. On home and cell phones. No answer.

## 2019-11-22 NOTE — Telephone Encounter (Signed)
I called and spoke with patient and her daughter Irineo Axon. They wanted to know why some of the doses of her medications were different in her [facility] mychart than in her [facility]. I advised them that the 2 systems are not directly linked and do not automatically update from one system to the next when there is a medication change. I answered all questions, and they verbally voiced understanding.

## 2019-11-22 NOTE — Telephone Encounter (Signed)
[place] Physical Therapist from [place] is requesting P therapy for 2 times a wk for 4 weeks. His call bk is [phone]

## 2019-11-23 NOTE — Telephone Encounter (Signed)
Verbal orders given  

## 2019-11-23 NOTE — Telephone Encounter (Signed)
Yes this is ok 

## 2019-11-26 DIAGNOSIS — F0150 Vascular dementia without behavioral disturbance: Secondary | ICD-10-CM | POA: Diagnosis not present

## 2019-11-26 DIAGNOSIS — E042 Nontoxic multinodular goiter: Secondary | ICD-10-CM | POA: Diagnosis not present

## 2019-11-26 DIAGNOSIS — M800AXD Age-related osteoporosis with current pathological fracture, other site, subsequent encounter for fracture with routine healing: Secondary | ICD-10-CM | POA: Diagnosis not present

## 2019-11-26 DIAGNOSIS — M720 Palmar fascial fibromatosis [Dupuytren]: Secondary | ICD-10-CM | POA: Diagnosis not present

## 2019-11-26 DIAGNOSIS — J302 Other seasonal allergic rhinitis: Secondary | ICD-10-CM | POA: Diagnosis not present

## 2019-11-26 DIAGNOSIS — E7800 Pure hypercholesterolemia, unspecified: Secondary | ICD-10-CM | POA: Diagnosis not present

## 2019-11-26 DIAGNOSIS — F0280 Dementia in other diseases classified elsewhere without behavioral disturbance: Secondary | ICD-10-CM | POA: Diagnosis not present

## 2019-11-26 DIAGNOSIS — F4320 Adjustment disorder, unspecified: Secondary | ICD-10-CM | POA: Diagnosis not present

## 2019-11-26 DIAGNOSIS — K5900 Constipation, unspecified: Secondary | ICD-10-CM | POA: Diagnosis not present

## 2019-11-26 DIAGNOSIS — N183 Chronic kidney disease, stage 3 unspecified: Secondary | ICD-10-CM | POA: Diagnosis not present

## 2019-11-26 DIAGNOSIS — I129 Hypertensive chronic kidney disease with stage 1 through stage 4 chronic kidney disease, or unspecified chronic kidney disease: Secondary | ICD-10-CM | POA: Diagnosis not present

## 2019-11-26 DIAGNOSIS — G309 Alzheimer's disease, unspecified: Secondary | ICD-10-CM | POA: Diagnosis not present

## 2019-11-26 DIAGNOSIS — E785 Hyperlipidemia, unspecified: Secondary | ICD-10-CM | POA: Diagnosis not present

## 2019-12-05 ENCOUNTER — Telehealth: Payer: Self-pay

## 2019-12-05 NOTE — Telephone Encounter (Signed)
Patient reports she has not had PT in the last several days. Patient reports that her last session was on 11/22/2019, reports that Gerald Stabs called [place] to request additional PT. Orders were approved by Tawanna Sat and Gerald Stabs was called back to let him know of approved orders. I did call Gerald Stabs on his secured cell phone and left a detailed message for him to call us or the patient.

## 2019-12-05 NOTE — Telephone Encounter (Signed)
Copied from [place] [phone]. Topic: General - Other >> Dec 05, 2019 11:04 AM Yvette Rack wrote: Reason for CRM: Pt stated she needs to speak with Dr. B regarding her therapy. Pt stated she has not been contacted and it is urgent that she speaks with her doctor. Cb# [phone]

## 2019-12-07 NOTE — Telephone Encounter (Signed)
Patient reports that someone did go to her house yesterday and Gerald Stabs did call her.

## 2019-12-07 NOTE — Telephone Encounter (Signed)
Spoke to [place] regarding PT. He reports patient has been getting PT. He reports that he did get back in touch with her yesterday. He reports that patient is "a little confused" but they have wrote down on her calender dates of PT.

## 2019-12-12 ENCOUNTER — Encounter: Payer: Self-pay | Admitting: Family Medicine

## 2019-12-12 ENCOUNTER — Telehealth (INDEPENDENT_AMBULATORY_CARE_PROVIDER_SITE_OTHER): Payer: PPO | Admitting: Family Medicine

## 2019-12-12 DIAGNOSIS — S32592D Other specified fracture of left pubis, subsequent encounter for fracture with routine healing: Secondary | ICD-10-CM | POA: Diagnosis not present

## 2019-12-12 DIAGNOSIS — S32591D Other specified fracture of right pubis, subsequent encounter for fracture with routine healing: Secondary | ICD-10-CM | POA: Diagnosis not present

## 2019-12-12 NOTE — Progress Notes (Signed)
MyChart Video Visit - Transitioned to telephone visit    Virtual Visit via Video Note   This visit type was conducted due to national recommendations for restrictions regarding the COVID-19 Pandemic (e.g. social distancing) in an effort to limit this patient's exposure and mitigate transmission in our community. This patient is at least at moderate risk for complications without adequate follow up. This format is felt to be most appropriate for this patient at this time. Physical exam was limited by quality of the video and audio technology used for the visit.    Patient location: home Provider location: [place] involved in the visit: patient, provider  Interactive audio and video communications were attempted, although failed due to patient's inability to connect to video. Continued visit with audio only interaction with patient agreement.    Patient: Becky Prince   DOB: [birth year]   86 y.o. Female  MRN: [id number] Visit Date: 12/12/2019  Today's healthcare provider: [name], MD   Chief Complaint  Patient presents with  . Hospitalization Follow-up   Subjective    HPI   Follow up Hospitalization  Patient was admitted to [facility] on 10/04/2019 and discharged on 10/06/2019. She was treated for bilateral pubic rami fractures after fall. Treatment for this included non-surgical management. Telephone follow up was done on n/a She reports excellent compliance with treatment. She reports this condition is improved.   She is able to walk now. She uses a cane for stabilization.  She has not left the house yet.  She has help in the mornings and evenings for ADLs.  She graduated from PT.   ----------------------------------------------------------------------------------------- -    Patient Active Problem List   Diagnosis Date Noted  . Bilateral pubic rami fractures ([place]) 10/03/2019  . Fracture of multiple pubic rami, left, closed, initial  encounter ([place]) 10/03/2019  . Adjustment disorder 02/13/2019  . Night sweats 02/13/2019  . 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 Dupuytren's contracture of both hands 03/17/2018  . Cerebrovascular disease 10/06/2016  . Multinodular goiter 10/06/2016  . TIA (transient ischemic attack) 09/28/2016  . Osteoporosis 10/14/2015  . Constipation 04/12/2015  . Contracture of finger joint 04/12/2015  . Hypercholesteremia 04/12/2015  . BP (high blood pressure) 04/12/2015  . Allergic rhinitis, seasonal 04/12/2015  . Subclinical hyperthyroidism 11/16/2014   Past Medical History:  Diagnosis Date  . Depression   . Environmental allergies   . Hypercholesteremia   . Hypertension    Allergies  Allergen Reactions  . Naproxen Swelling    Had lip swelling 20 yrs ago - not sure if caused by oxycodone or naproxen  . Oxycodone-Aspirin Swelling    Had lip swelling 20 yrs ago. Not sure if caused by oxycodone or naproxen.  . Prednisone Diarrhea    Medications: Outpatient Medications Prior to Visit  Medication Sig  . acetaminophen (TYLENOL) 500 MG tablet Take 2 tablets (1,000 mg total) by mouth 3 (three) times daily as needed for mild pain (or Fever >/= 101).  Marland Kitchen amLODipine (NORVASC) 5 MG tablet Take 1 tablet (5 mg total) by mouth daily.  . ASPIRIN 81 PO TAKE 1 TABLET BY MOUTH EVERY DAY  . atorvastatin (LIPITOR) 40 MG tablet TAKE 1 TABLET BY MOUTH DAILY AT 6 PM.  . bisacodyl (DULCOLAX) 5 MG EC tablet Take 1 tablet (5 mg total) by mouth daily as needed for moderate constipation.  . citalopram (CELEXA) 10 MG tablet TAKE 1 TABLET BY MOUTH EVERY DAY  . donepezil (ARICEPT) 10 MG tablet  TAKE 1 TABLET BY MOUTH EVERYDAY AT BEDTIME  . fluticasone (FLONASE) 50 MCG/ACT nasal spray Place 1 spray into both nostrils daily.  . mirtazapine (REMERON) 15 MG tablet TAKE 1 TABLET BY MOUTH EVERYDAY AT BEDTIME  . Multiple Vitamin (MULTI-VITAMIN) tablet Take by mouth.  . senna-docusate (SENOKOT-S) 8.6-50 MG tablet Take 1  tablet by mouth at bedtime as needed for mild constipation.  Marland Kitchen spironolactone (ALDACTONE) 25 MG tablet TAKE 1 TABLET BY MOUTH EVERY DAY   No facility-administered medications prior to visit.    Review of Systems  Constitutional: Negative.   Respiratory: Negative.   Cardiovascular: Negative.   Gastrointestinal: Negative.   Neurological: Negative for dizziness, light-headedness and headaches.      Objective    There were no vitals taken for this visit.   Physical Exam  Speaking in full sentences in NAD   Assessment & Plan     1. Closed bilateral fracture of pubic rami with routine healing, subsequent encounter -Patient reports she is healing well and able to ambulate with her cane -Pain is well controlled and she is no longer on tramadol -She is graduated from PT and come home from SNF rehab -She has the help that she needs at home -Return precautions discussed   Return in about 2 months (around 02/11/2020) for as scheduled.     I discussed the assessment and treatment plan with the patient. The patient was provided an opportunity to ask questions and all were answered. The patient agreed with the plan and demonstrated an understanding of the instructions.   The patient was advised to call back or seek an in-person evaluation if the symptoms worsen or if the condition fails to improve as anticipated.  I provided 15 minutes of non-face-to-face time during this encounter.  I, [name], MD, have reviewed all documentation for this visit. The documentation on 12/12/19 for the exam, diagnosis, procedures, and orders are all accurate and complete.   Lurdes Haltiwanger, [name], MD, MPH [facility]

## 2019-12-14 ENCOUNTER — Other Ambulatory Visit: Payer: Self-pay | Admitting: Family Medicine

## 2019-12-14 DIAGNOSIS — F4329 Adjustment disorder with other symptoms: Secondary | ICD-10-CM

## 2020-02-03 ENCOUNTER — Other Ambulatory Visit: Payer: Self-pay | Admitting: Family Medicine

## 2020-02-06 ENCOUNTER — Other Ambulatory Visit: Payer: Self-pay | Admitting: Family Medicine

## 2020-02-06 DIAGNOSIS — M5136 Other intervertebral disc degeneration, lumbar region: Secondary | ICD-10-CM | POA: Diagnosis not present

## 2020-02-06 DIAGNOSIS — M5416 Radiculopathy, lumbar region: Secondary | ICD-10-CM | POA: Diagnosis not present

## 2020-02-06 DIAGNOSIS — M48062 Spinal stenosis, lumbar region with neurogenic claudication: Secondary | ICD-10-CM | POA: Diagnosis not present

## 2020-02-06 NOTE — Telephone Encounter (Signed)
Requested Prescriptions  Pending Prescriptions Disp Refills  . amLODipine (NORVASC) 5 MG tablet [Pharmacy Med Name: AMLODIPINE BESYLATE 5 MG TAB] 90 tablet 1    Sig: TAKE 1 TABLET BY MOUTH EVERY DAY     Cardiovascular:  Calcium Channel Blockers Passed - 02/06/2020  2:38 PM      Passed - Last BP in normal range    BP Readings from Last 1 Encounters:  10/06/19 136/65         Passed - Valid encounter within last 6 months    Recent Outpatient Visits          1 month ago Closed bilateral fracture of pubic rami with routine healing, subsequent encounter   [facility], [name], MD   5 months ago Essential hypertension   [facility], [name], MD   10 months ago Night sweats   [facility], [name], MD   10 months ago Night sweats   [facility], [name], MD   11 months ago Night sweats   [facility], [name], MD      Future Appointments            In 2 weeks Bacigalupo, [name], MD [facility] [place], [place]

## 2020-02-20 DIAGNOSIS — Z79899 Other long term (current) drug therapy: Secondary | ICD-10-CM | POA: Diagnosis not present

## 2020-02-20 DIAGNOSIS — M5416 Radiculopathy, lumbar region: Secondary | ICD-10-CM | POA: Diagnosis not present

## 2020-02-20 DIAGNOSIS — M5136 Other intervertebral disc degeneration, lumbar region: Secondary | ICD-10-CM | POA: Diagnosis not present

## 2020-02-20 DIAGNOSIS — M48062 Spinal stenosis, lumbar region with neurogenic claudication: Secondary | ICD-10-CM | POA: Diagnosis not present

## 2020-02-22 ENCOUNTER — Ambulatory Visit: Payer: PPO | Admitting: Family Medicine

## 2020-02-22 NOTE — Progress Notes (Deleted)
Established patient visit   Patient: Becky Prince   DOB: May 20, 1925   86 y.o. Female  MRN: [id number] Visit Date: 02/22/2020  Today's healthcare provider: [name], MD   No chief complaint on file.  Subjective    HPI  Hypertension, follow-up  BP Readings from Last 3 Encounters:  10/06/19 136/65  08/24/19 122/66  02/13/19 (!) 144/70   Wt Readings from Last 3 Encounters:  10/03/19 132 lb (59.9 kg)  08/24/19 130 lb (59 kg)  02/13/19 122 lb (55.3 kg)     She was last seen for hypertension 6 months ago.  BP at that visit was 122/66. Management since that visit includes continue same medication.  She reports {excellent/good/fair/poor:19665} compliance with treatment. She {is/is not:9024} having side effects. {document side effects if present:1} She is following a {diet:21022986} diet. She {is/is not:9024} exercising. She {does/does not:200015} smoke.  Use of agents associated with hypertension: NSAIDS.   Outside blood pressures are {***enter patient reported home BP readings, or 'not being checked':1}. Symptoms: {Yes/No:20286} chest pain {Yes/No:20286} chest pressure  {Yes/No:20286} palpitations {Yes/No:20286} syncope  {Yes/No:20286} dyspnea {Yes/No:20286} orthopnea  {Yes/No:20286} paroxysmal nocturnal dyspnea {Yes/No:20286} lower extremity edema   Pertinent labs: Lab Results  Component Value Date   CHOL 145 08/24/2019   HDL 61 08/24/2019   LDLCALC 61 08/24/2019   TRIG 130 08/24/2019   CHOLHDL 2.4 08/24/2019   Lab Results  Component Value Date   NA 136 10/06/2019   K 4.4 10/06/2019   CREATININE 1.42 (H) 10/06/2019   GFRNONAA 32 (L) 10/06/2019   GFRAA 37 (L) 10/06/2019   GLUCOSE 105 (H) 10/06/2019     The ASCVD Risk score (Goff DC Jr., et al., 2013) failed to calculate for the following reasons:   The 2013 ASCVD risk score is only valid for ages 46 to 57    ---------------------------------------------------------------------------------------------------  Follow up for Dementia:  The patient was last seen for this 6 months ago. Changes made at last visit include none; continue Aricept.  She reports {excellent/good/fair/poor:19665} compliance with treatment. She feels that condition is {improved/worse/unchanged:3041574}. She {is/is not:21021397} having side effects. ***  -----------------------------------------------------------------------------------------    Follow up for Closed bilateral fracture of pubic rami with routine healing  The patient was last seen for this 2 months ago.      Changes made at last visit include none. Patient reported that she was healing well and able to ambulate with her cane     Pain was well controlled and she is no longer on tramadol. She is graduated from PT and come home from SNF rehab. She had the help that she needs at home. Return precautions discussed  She reports {excellent/good/fair/poor:19665} compliance with treatment. She feels that condition is {improved/worse/unchanged:3041574}. She {is/is not:21021397} having side effects. ***  -----------------------------------------------------------------------------------------   {Show patient history (optional):23778::" "}   Medications: Outpatient Medications Prior to Visit  Medication Sig  . acetaminophen (TYLENOL) 500 MG tablet Take 2 tablets (1,000 mg total) by mouth 3 (three) times daily as needed for mild pain (or Fever >/= 101).  Marland Kitchen amLODipine (NORVASC) 5 MG tablet TAKE 1 TABLET BY MOUTH EVERY DAY  . ASPIRIN 81 PO TAKE 1 TABLET BY MOUTH EVERY DAY  . atorvastatin (LIPITOR) 40 MG tablet TAKE 1 TABLET BY MOUTH DAILY AT 6 PM.  . bisacodyl (DULCOLAX) 5 MG EC tablet Take 1 tablet (5 mg total) by mouth daily as needed for moderate constipation.  . citalopram (CELEXA) 10 MG tablet TAKE 1 TABLET BY  MOUTH EVERY DAY  . donepezil (ARICEPT) 10 MG  tablet TAKE 1 TABLET BY MOUTH EVERYDAY AT BEDTIME  . fluticasone (FLONASE) 50 MCG/ACT nasal spray Place 1 spray into both nostrils daily.  . mirtazapine (REMERON) 15 MG tablet TAKE 1 TABLET BY MOUTH EVERYDAY AT BEDTIME  . Multiple Vitamin (MULTI-VITAMIN) tablet Take by mouth.  . senna-docusate (SENOKOT-S) 8.6-50 MG tablet Take 1 tablet by mouth at bedtime as needed for mild constipation.  Marland Kitchen spironolactone (ALDACTONE) 25 MG tablet TAKE 1 TABLET BY MOUTH EVERY DAY   No facility-administered medications prior to visit.    Review of Systems  {Heme  Chem  Endocrine  Serology  Results Review (optional):23779::" "}  Objective    There were no vitals taken for this visit. {Show previous vital signs (optional):23777::" "}  Physical Exam  ***  No results found for any visits on 02/22/20.  Assessment & Plan     ***  No follow-ups on file.      {provider attestation***:1}   [name], MD  [facility] [phone] (phone) [phone] (fax)  [place]

## 2020-02-23 ENCOUNTER — Ambulatory Visit: Payer: PPO | Admitting: Family Medicine

## 2020-02-29 ENCOUNTER — Encounter: Payer: Self-pay | Admitting: Family Medicine

## 2020-02-29 ENCOUNTER — Telehealth (INDEPENDENT_AMBULATORY_CARE_PROVIDER_SITE_OTHER): Payer: PPO | Admitting: Family Medicine

## 2020-02-29 DIAGNOSIS — S32591D Other specified fracture of right pubis, subsequent encounter for fracture with routine healing: Secondary | ICD-10-CM

## 2020-02-29 DIAGNOSIS — F0150 Vascular dementia without behavioral disturbance: Secondary | ICD-10-CM

## 2020-02-29 DIAGNOSIS — F4323 Adjustment disorder with mixed anxiety and depressed mood: Secondary | ICD-10-CM

## 2020-02-29 DIAGNOSIS — F4329 Adjustment disorder with other symptoms: Secondary | ICD-10-CM

## 2020-02-29 DIAGNOSIS — G309 Alzheimer's disease, unspecified: Secondary | ICD-10-CM

## 2020-02-29 DIAGNOSIS — F0280 Dementia in other diseases classified elsewhere without behavioral disturbance: Secondary | ICD-10-CM | POA: Diagnosis not present

## 2020-02-29 DIAGNOSIS — I10 Essential (primary) hypertension: Secondary | ICD-10-CM | POA: Diagnosis not present

## 2020-02-29 DIAGNOSIS — E0590 Thyrotoxicosis, unspecified without thyrotoxic crisis or storm: Secondary | ICD-10-CM

## 2020-02-29 DIAGNOSIS — S32592D Other specified fracture of left pubis, subsequent encounter for fracture with routine healing: Secondary | ICD-10-CM

## 2020-02-29 MED ORDER — MIRTAZAPINE 15 MG PO TABS: 15.0000 mg | ORAL_TABLET | Freq: Every day | ORAL | 1 refills | Status: DC

## 2020-02-29 MED ORDER — DONEPEZIL HCL 10 MG PO TABS: ORAL_TABLET | ORAL | 1 refills | Status: DC

## 2020-02-29 MED ORDER — SPIRONOLACTONE 25 MG PO TABS: 25.0000 mg | ORAL_TABLET | Freq: Every day | ORAL | 1 refills | Status: DC

## 2020-02-29 MED ORDER — ATORVASTATIN CALCIUM 40 MG PO TABS: 40.0000 mg | ORAL_TABLET | Freq: Every day | ORAL | 3 refills | Status: DC

## 2020-02-29 MED ORDER — CITALOPRAM HYDROBROMIDE 10 MG PO TABS: 10.0000 mg | ORAL_TABLET | Freq: Every day | ORAL | 1 refills | Status: DC

## 2020-02-29 MED ORDER — AMLODIPINE BESYLATE 5 MG PO TABS: 5.0000 mg | ORAL_TABLET | Freq: Every day | ORAL | 1 refills | Status: DC

## 2020-02-29 NOTE — Assessment & Plan Note (Signed)
Not on medications Recheck TSH and free T4

## 2020-02-29 NOTE — Assessment & Plan Note (Signed)
Chronic and stable Continue aricept

## 2020-02-29 NOTE — Assessment & Plan Note (Signed)
Chronic and well controlled Continue celexa and remeron at current doses

## 2020-02-29 NOTE — Assessment & Plan Note (Signed)
Followed by PM&R Recovering well Advised that she only needs to take opioids prn not every day These are prescribed elsewhere

## 2020-02-29 NOTE — Assessment & Plan Note (Signed)
Well controlled on last check Continue current medications Recheck metabolic panel F/u in 6 months

## 2020-02-29 NOTE — Progress Notes (Signed)
MyChart Video Visit    Virtual Visit via Video Note   This visit type was conducted due to national recommendations for restrictions regarding the COVID-19 Pandemic (e.g. social distancing) in an effort to limit this patient's exposure and mitigate transmission in our community. This patient is at least at moderate risk for complications without adequate follow up. This format is felt to be most appropriate for this patient at this time. Physical exam was limited by quality of the video and audio technology used for the visit.    Patient location: home Provider location: [place] involved in the visit: patient, provider  I discussed the limitations of evaluation and management by telemedicine and the availability of in person appointments. The patient expressed understanding and agreed to proceed.  Interactive audio and video communications were attempted, although failed due to patient's inability to connect to video. Continued visit with audio only interaction with patient agreement.   Patient: Becky Prince   DOB: [birth year]   86 y.o. Female  MRN: [id number] Visit Date: 02/29/2020  Today's healthcare provider: [name], MD   Chief Complaint  Patient presents with   Follow-up   Hypertension   Subjective    HPI HPI    Hypertension    This is a chronic problem.  The problem is controlled.  Compliance with treatment is excellent.  Agents associated with hypertension include amphetamines.  Anxiety: Absent.  Blurred vision: Absent.  Chest pain: Absent.  Chest pressure/discomfort: Absent.  Dyspnea: Absent.  Headaches: Absent.  Lower extremity edema: Absent.  Orthopnea: Absent.  Palpitations: Absent.  Paroxysmal nocturnal dyspnea: Absent.  Syncope: Absent.  The patient rarely exercises.  Patient's diet is generally unhealty.       Last edited by Dorian Pod, CMA on 02/29/2020  1:34 PM. (History)      Patient Active Problem List    Diagnosis Date Noted   Bilateral pubic rami fractures ([place]) 10/03/2019   Adjustment disorder 02/13/2019   Night sweats 02/13/2019   Mixed dementia ([place]) 03/17/2018   Dupuytren's contracture of both hands 03/17/2018   Cerebrovascular disease 10/06/2016   Multinodular goiter 10/06/2016   TIA (transient ischemic attack) 09/28/2016   Osteoporosis 10/14/2015   Constipation 04/12/2015   Contracture of finger joint 04/12/2015   Hypercholesteremia 04/12/2015   BP (high blood pressure) 04/12/2015   Allergic rhinitis, seasonal 04/12/2015   Subclinical hyperthyroidism 11/16/2014   Social History   Tobacco Use   Smoking status: Never Smoker   Smokeless tobacco: Never Used  Vaping Use   Vaping Use: Never used  Substance Use Topics   Alcohol use: No    Alcohol/week: 0.0 standard drinks   Drug use: No   Family History  Problem Relation Age of Onset   Heart disease Mother        CHF   Hypertension Mother    Cancer Father        prostate   Cancer Sister        breast   Kidney disease Brother    Breast cancer Neg Hx    Allergies  Allergen Reactions   Naproxen Swelling    Had lip swelling 20 yrs ago - not sure if caused by oxycodone or naproxen   Oxycodone-Aspirin Swelling    Had lip swelling 20 yrs ago. Not sure if caused by oxycodone or naproxen.   Prednisone Diarrhea      Medications: Outpatient Medications Prior to Visit  Medication Sig   acetaminophen (TYLENOL) 500  MG tablet Take 2 tablets (1,000 mg total) by mouth 3 (three) times daily as needed for mild pain (or Fever >/= 101).   ASPIRIN 81 PO TAKE 1 TABLET BY MOUTH EVERY DAY   fluticasone (FLONASE) 50 MCG/ACT nasal spray Place 1 spray into both nostrils daily.   HYDROcodone-acetaminophen (NORCO/VICODIN) 5-325 MG tablet 1/2 tablet bid prn   traMADol (ULTRAM) 50 MG tablet Take 50 mg by mouth every 6 (six) hours as needed.   [DISCONTINUED] amLODipine (NORVASC) 5 MG tablet TAKE 1 TABLET  BY MOUTH EVERY DAY   [DISCONTINUED] atorvastatin (LIPITOR) 40 MG tablet TAKE 1 TABLET BY MOUTH DAILY AT 6 PM.   [DISCONTINUED] bisacodyl (DULCOLAX) 5 MG EC tablet Take 1 tablet (5 mg total) by mouth daily as needed for moderate constipation.   [DISCONTINUED] citalopram (CELEXA) 10 MG tablet TAKE 1 TABLET BY MOUTH EVERY DAY   [DISCONTINUED] donepezil (ARICEPT) 10 MG tablet TAKE 1 TABLET BY MOUTH EVERYDAY AT BEDTIME   [DISCONTINUED] mirtazapine (REMERON) 15 MG tablet TAKE 1 TABLET BY MOUTH EVERYDAY AT BEDTIME   [DISCONTINUED] Multiple Vitamin (MULTI-VITAMIN) tablet Take by mouth.   [DISCONTINUED] senna-docusate (SENOKOT-S) 8.6-50 MG tablet Take 1 tablet by mouth at bedtime as needed for mild constipation.   [DISCONTINUED] spironolactone (ALDACTONE) 25 MG tablet TAKE 1 TABLET BY MOUTH EVERY DAY   [DISCONTINUED] meloxicam (MOBIC) 7.5 MG tablet Take 7.5 mg by mouth daily. (Patient not taking: Reported on 02/29/2020)   No facility-administered medications prior to visit.    Review of Systems  Constitutional: Negative for activity change and fever.  Respiratory: Negative for chest tightness and shortness of breath.   Cardiovascular: Negative for chest pain, palpitations and leg swelling.    Last CBC Lab Results  Component Value Date   WBC 7.4 10/06/2019   HGB 12.6 10/06/2019   HCT 39.3 10/06/2019   MCV 98.0 10/06/2019   MCH 31.4 10/06/2019   RDW 12.0 10/06/2019   PLT 190 12/45/8099   Last metabolic panel Lab Results  Component Value Date   GLUCOSE 105 (H) 10/06/2019   NA 136 10/06/2019   K 4.4 10/06/2019   CL 102 10/06/2019   CO2 27 10/06/2019   BUN 28 (H) 10/06/2019   CREATININE 1.42 (H) 10/06/2019   GFRNONAA 32 (L) 10/06/2019   GFRAA 37 (L) 10/06/2019   CALCIUM 8.6 (L) 10/06/2019   PHOS 2.4 (L) 10/04/2019   PROT 7.3 10/03/2019   ALBUMIN 4.0 10/03/2019   LABGLOB 2.5 08/24/2019   AGRATIO 1.8 08/24/2019   BILITOT 0.8 10/03/2019   ALKPHOS 119 10/03/2019   AST 24  10/03/2019   ALT 18 10/03/2019   ANIONGAP 7 10/06/2019   Last lipids Lab Results  Component Value Date   CHOL 145 08/24/2019   HDL 61 08/24/2019   LDLCALC 61 08/24/2019   TRIG 130 08/24/2019   CHOLHDL 2.4 08/24/2019   Last hemoglobin A1c Lab Results  Component Value Date   HGBA1C 5.1 09/28/2016   Last thyroid functions Lab Results  Component Value Date   TSH 0.774 03/31/2019   T4TOTAL 7.6 03/31/2019   Last vitamin D No results found for: 25OHVITD2, 25OHVITD3, VD25OH Last vitamin B12 and Folate No results found for: VITAMINB12, FOLATE    Objective    There were no vitals taken for this visit. BP Readings from Last 3 Encounters:  10/06/19 136/65  08/24/19 122/66  02/13/19 (!) 144/70   Wt Readings from Last 3 Encounters:  10/03/19 132 lb (59.9 kg)  08/24/19 130 lb (59  kg)  02/13/19 122 lb (55.3 kg)      Physical Exam  Speaking in full sentences in NAD   Assessment & Plan     Problem List Items Addressed This Visit      Cardiovascular and Mediastinum   BP (high blood pressure)    Well controlled on last check Continue current medications Recheck metabolic panel F/u in 6 months       Relevant Medications   amLODipine (NORVASC) 5 MG tablet   atorvastatin (LIPITOR) 40 MG tablet   spironolactone (ALDACTONE) 25 MG tablet   Other Relevant Orders   Basic Metabolic Panel (BMET)   Mixed dementia (HCC)    Chronic and stable Continue aricept      Relevant Medications   amLODipine (NORVASC) 5 MG tablet   atorvastatin (LIPITOR) 40 MG tablet   citalopram (CELEXA) 10 MG tablet   donepezil (ARICEPT) 10 MG tablet   mirtazapine (REMERON) 15 MG tablet   spironolactone (ALDACTONE) 25 MG tablet     Endocrine   Subclinical hyperthyroidism    Not on medications Recheck TSH and free T4      Relevant Orders   TSH + free T4     Musculoskeletal and Integument   Bilateral pubic rami fractures (HCC)    Followed by PM&R Recovering well Advised that she only  needs to take opioids prn not every day These are prescribed elsewhere        Other   Adjustment disorder    Chronic and well controlled Continue celexa and remeron at current doses      Relevant Medications   citalopram (CELEXA) 10 MG tablet       Return in about 6 months (around 08/28/2020) for AWV, CPE.     I discussed the assessment and treatment plan with the patient. The patient was provided an opportunity to ask questions and all were answered. The patient agreed with the plan and demonstrated an understanding of the instructions.   The patient was advised to call back or seek an in-person evaluation if the symptoms worsen or if the condition fails to improve as anticipated.  I provided 22 minutes of non-face-to-face time during this encounter.  I, [name], MD, have reviewed all documentation for this visit. The documentation on 02/29/20 for the exam, diagnosis, procedures, and orders are all accurate and complete.   Nico Rogness, [name], MD, MPH [facility]

## 2020-03-14 DIAGNOSIS — I10 Essential (primary) hypertension: Secondary | ICD-10-CM | POA: Diagnosis not present

## 2020-03-14 DIAGNOSIS — E0590 Thyrotoxicosis, unspecified without thyrotoxic crisis or storm: Secondary | ICD-10-CM | POA: Diagnosis not present

## 2020-03-15 LAB — BASIC METABOLIC PANEL
BUN/Creatinine Ratio: 13 (ref 12–28)
BUN: 19 mg/dL (ref 10–36)
CO2: 24 mmol/L (ref 20–29)
Calcium: 9.7 mg/dL (ref 8.7–10.3)
Chloride: 98 mmol/L (ref 96–106)
Creatinine, Ser: 1.46 mg/dL — ABNORMAL HIGH (ref 0.57–1.00)
GFR calc Af Amer: 36 mL/min/{1.73_m2} — ABNORMAL LOW (ref >59)
GFR calc non Af Amer: 31 mL/min/{1.73_m2} — ABNORMAL LOW (ref >59)
Glucose: 103 mg/dL — ABNORMAL HIGH (ref 65–99)
Potassium: 4.6 mmol/L (ref 3.5–5.2)
Sodium: 139 mmol/L (ref 134–144)

## 2020-03-15 LAB — TSH+FREE T4
Free T4: 1.38 ng/dL (ref 0.82–1.77)
TSH: 0.93 u[IU]/mL (ref 0.450–4.500)

## 2020-03-29 ENCOUNTER — Other Ambulatory Visit: Payer: Self-pay | Admitting: Family Medicine

## 2020-03-29 NOTE — Telephone Encounter (Signed)
Medication Refill - Medication: tramadol 50 mg  Has the patient contacted their pharmacy? No. (Agent: If no, request that the patient contact the pharmacy for the refill.) (Agent: If yes, when and what did the pharmacy advise?)  Preferred Pharmacy (with phone number or street name): [facility]  Agent: Please be advised that RX refills may take up to 3 business days. We ask that you follow-up with your pharmacy.

## 2020-03-29 NOTE — Telephone Encounter (Signed)
Looks like she has been prescribed hydrocodone by Dr. [name] and his office for the last few months.  She should not be mixing these medications.

## 2020-03-29 NOTE — Telephone Encounter (Signed)
Requested medication (s) are due for refill today: yes   Requested medication (s) are on the active medication list:yes   Future visit scheduled: no  Notes to clinic: this refill cannot be delegated Filled by a historical provider    Requested Prescriptions  Pending Prescriptions Disp Refills   traMADol (ULTRAM) 50 MG tablet 30 tablet     Sig: Take 1 tablet (50 mg total) by mouth every 6 (six) hours as needed.      Not Delegated - Analgesics:  Opioid Agonists Failed - 03/29/2020 10:26 AM      Failed - This refill cannot be delegated      Failed - Urine Drug Screen completed in last 360 days      Passed - Valid encounter within last 6 months    Recent Outpatient Visits           4 weeks ago Primary hypertension   [facility] [place], [name], MD   3 months ago Closed bilateral fracture of pubic rami with routine healing, subsequent encounter   [facility], [name], MD   7 months ago Essential hypertension   [place], [name], MD   11 months ago Night sweats   [facility] [place], [name], MD   1 year ago Night sweats   [facility] [place], [name], MD

## 2020-04-23 DIAGNOSIS — M48062 Spinal stenosis, lumbar region with neurogenic claudication: Secondary | ICD-10-CM | POA: Diagnosis not present

## 2020-04-23 DIAGNOSIS — M5136 Other intervertebral disc degeneration, lumbar region: Secondary | ICD-10-CM | POA: Diagnosis not present

## 2020-04-23 DIAGNOSIS — M5416 Radiculopathy, lumbar region: Secondary | ICD-10-CM | POA: Diagnosis not present

## 2020-06-19 ENCOUNTER — Telehealth: Payer: Self-pay

## 2020-06-19 NOTE — Telephone Encounter (Signed)
Copied from [place] [phone]. Topic: Appointment Scheduling - Scheduling Inquiry for Clinic >> Jun 19, 2020  4:40 PM Pawlus, Brayton Layman A wrote: Reason for CRM: Patient wanted to know if she could do her Medicare AWV virtually. PT wanted to know if she has to drive down just to be asked a few questions or will actually see Dr. B during this visit. Please clarify what this visit will consist of and if she can do it virtually.

## 2020-06-20 NOTE — Telephone Encounter (Signed)
Spoke with pt who states she would like the AWV to be a call instead of in office. Pt request a call on her cell # on file. Changed apt type and made a note. Also scheduled CPE with PCP for 09/10/20 @ 2:20 PM.

## 2020-06-24 ENCOUNTER — Other Ambulatory Visit: Payer: Self-pay | Admitting: Family Medicine

## 2020-06-27 NOTE — Progress Notes (Signed)
Subjective:   Becky Prince is a 87 y.o. female who presents for Medicare Annual (Subsequent) preventive examination.  I connected with Becky Prince today by telephone and verified that I am speaking with the correct person using two identifiers. Location patient: home Location provider: work Persons participating in the virtual visit: patient, provider.   I discussed the limitations, risks, security and privacy concerns of performing an evaluation and management service by telephone and the availability of in person appointments. I also discussed with the patient that there may be a patient responsible charge related to this service. The patient expressed understanding and verbally consented to this telephonic visit.    Interactive audio and video telecommunications were attempted between this provider and patient, however failed, due to patient having technical difficulties OR patient did not have access to video capability.  We continued and completed visit with audio only.   Review of Systems    N/A  Cardiac Risk Factors include: advanced age (>80mn, >>81women);dyslipidemia;hypertension     Objective:    There were no vitals filed for this visit. There is no height or weight on file to calculate BMI.  Advanced Directives 07/01/2020 10/04/2019 10/03/2019 06/27/2019 04/05/2019 03/31/2019 03/10/2017  Does Patient Have a Medical Advance Directive? Yes Yes Yes Yes Yes Yes Yes  Type of AParamedicof ALaonaLiving will Living will Living will HCarthageLiving will [place]Living will Living will;Healthcare Power of ASt. FrancisLiving will  Does patient want to make changes to medical advance directive? - No - Patient declined - - - - -  Copy of [place] Chart? No - copy requested - - No - copy requested No - copy requested - No - copy requested  Would patient like information on  creating a medical advance directive? - No - Patient declined No - Patient declined - No - Patient declined No - Patient declined -    Current Medications (verified) Outpatient Encounter Medications as of 07/01/2020  Medication Sig  . acetaminophen (TYLENOL) 500 MG tablet Take 2 tablets (1,000 mg total) by mouth 3 (three) times daily as needed for mild pain (or Fever >/= 101).  .Marland KitchenamLODipine (NORVASC) 5 MG tablet Take 1 tablet (5 mg total) by mouth daily.  . ASPIRIN 81 PO Take 81 mg by mouth daily at 6 (six) AM.  . atorvastatin (LIPITOR) 40 MG tablet Take 1 tablet (40 mg total) by mouth daily.  . citalopram (CELEXA) 10 MG tablet Take 1 tablet (10 mg total) by mouth daily.  .Marland Kitchendonepezil (ARICEPT) 10 MG tablet TAKE 1 TABLET BY MOUTH EVERYDAY AT BEDTIME  . fluticasone (FLONASE) 50 MCG/ACT nasal spray Place 1 spray into both nostrils daily.  .Marland KitchenHYDROcodone-acetaminophen (NORCO/VICODIN) 5-325 MG tablet Take 0.5 tablets by mouth 2 (two) times daily as needed.  . mirtazapine (REMERON) 15 MG tablet TAKE 1 TABLET BY MOUTH EVERYDAY AT BEDTIME  . spironolactone (ALDACTONE) 25 MG tablet TAKE 1 TABLET BY MOUTH EVERY DAY  . traMADol (ULTRAM) 50 MG tablet Take 50 mg by mouth every 6 (six) hours as needed.   No facility-administered encounter medications on file as of 07/01/2020.    Allergies (verified) Naproxen, Oxycodone-aspirin, and Prednisone   History: Past Medical History:  Diagnosis Date  . Depression   . Environmental allergies   . Hypercholesteremia   . Hypertension    Past Surgical History:  Procedure Laterality Date  . ABDOMINAL HYSTERECTOMY    .  CATARACT EXTRACTION W/PHACO Right 06/12/2015   Procedure: CATARACT EXTRACTION PHACO AND INTRAOCULAR LENS PLACEMENT (IOC);  Surgeon: Leandrew Koyanagi, MD;  Location: [facility];  Service: Ophthalmology;  Laterality: Right;   Family History  Problem Relation Age of Onset  . Heart disease Mother        CHF  . Hypertension Mother   .  Cancer Father        prostate  . Cancer Sister        breast  . Kidney disease Brother   . Breast cancer Neg Hx    Social History   Socioeconomic History  . Marital status: Widowed    Spouse name: Not on file  . Number of children: 1  . Years of education: Not on file  . Highest education level: 11th grade  Occupational History  . Occupation: retired  Tobacco Use  . Smoking status: Never Smoker  . Smokeless tobacco: Never Used  Vaping Use  . Vaping Use: Never used  Substance and Sexual Activity  . Alcohol use: No    Alcohol/week: 0.0 standard drinks  . Drug use: No  . Sexual activity: Not on file  Other Topics Concern  . Not on file  Social History Narrative  . Not on file   Social Determinants of Health   Financial Resource Strain: Low Risk   . Difficulty of Paying Living Expenses: Not hard at all  Food Insecurity: No Food Insecurity  . Worried About Charity fundraiser in the Last Year: Never true  . Ran Out of Food in the Last Year: Never true  Transportation Needs: No Transportation Needs  . Lack of Transportation (Medical): No  . Lack of Transportation (Non-Medical): No  Physical Activity: Inactive  . Days of Exercise per Week: 0 days  . Minutes of Exercise per Session: 0 min  Stress: No Stress Concern Present  . Feeling of Stress : Not at all  Social Connections: Socially Isolated  . Frequency of Communication with Friends and Family: More than three times a week  . Frequency of Social Gatherings with Friends and Family: Once a week  . Attends Religious Services: Never  . Active Member of Clubs or Organizations: No  . Attends Archivist Meetings: Never  . Marital Status: Widowed    Tobacco Counseling Counseling given: Not Answered   Clinical Intake:  Pre-visit preparation completed: Yes  Pain : No/denies pain     Nutritional Risks: None Diabetes: No  How often do you need to have someone help you when you read instructions,  pamphlets, or other written materials from your doctor or pharmacy?: 1 - Never  Diabetic? No  Interpreter Needed?: No  Information entered by :: [facility], LPN   Activities of Daily Living In your present state of health, do you have any difficulty performing the following activities: 07/01/2020 10/04/2019  Hearing? N N  Vision? N N  Difficulty concentrating or making decisions? Y Y  Comment Currently on Donepezil. -  Walking or climbing stairs? Y Y  Comment due to hip pain -  Dressing or bathing? N N  Doing errands, shopping? N N  Preparing Food and eating ? N -  Using the Toilet? N -  In the past six months, have you accidently leaked urine? Y -  Comment Occasionally during the night. -  Do you have problems with loss of bowel control? N -  Managing your Medications? N -  Managing your Finances? Y -  Comment Daughter  assists with finances. -  Housekeeping or managing your Housekeeping? Y -  Comment Has a Chartered certified accountant for heavy duty cleaning. -  Some recent data might be hidden    Patient Care Team: Bacigalupo, [name], MD as PCP - General (Family Medicine) [name], MD as Referring Physician (Physical Medicine and Rehabilitation) Murlean Iba, MD (Nephrology) Leandrew Koyanagi, MD as Referring Physician (Ophthalmology) Meeler, Sherren Kerns, FNP (Family Medicine)  Indicate any recent Medical Services you may have received from other than [facility] providers in the past year (date may be approximate).     Assessment:   This is a routine wellness examination for Concepcion.  Hearing/Vision screen No exam data present  Dietary issues and exercise activities discussed: Current Exercise Habits: The patient does not participate in regular exercise at present, Exercise limited by: orthopedic condition(s)  Goals    . Increase water intake     Recommend increasing water intake to 6-8 8 oz glasses a day.       Depression Screen PHQ 2/9 Scores 07/01/2020 08/24/2019  02/13/2019 09/15/2018 03/17/2018 10/13/2017 05/24/2017  PHQ - 2 Score 1 0 0 0 0 4 3  PHQ- 9 Score - 0 1 0 0 5 13    Fall Risk Fall Risk  07/01/2020 06/27/2019 09/15/2018 03/17/2018 03/10/2017  Falls in the past year? 0 0 1 0 Yes  Number falls in past yr: 0 0 1 0 2 or more  Injury with Fall? 0 0 0 - No  Follow up - - - - Falls prevention discussed    FALL RISK PREVENTION PERTAINING TO THE HOME:  Any stairs in or around the home? Yes  If so, are there any without handrails? No  Home free of loose throw rugs in walkways, pet beds, electrical cords, etc? Yes  Adequate lighting in your home to reduce risk of falls? Yes   ASSISTIVE DEVICES UTILIZED TO PREVENT FALLS:  Life alert? Yes  Use of a cane, walker or w/c? Yes  Grab bars in the bathroom? Yes  Shower chair or bench in shower? Yes  Elevated toilet seat or a handicapped toilet? Yes          Immunizations Immunization History  Administered Date(s) Administered  . Influenza, High Dose Seasonal PF 02/09/2015, 03/06/2016, 01/18/2017, 03/17/2018, 12/27/2018, 02/19/2020  . PFIZER(Purple Top)SARS-COV-2 Vaccination 05/16/2019, 06/07/2019, 01/26/2020  . Pneumococcal Conjugate-13 08/29/2014  . Pneumococcal Polysaccharide-23 03/06/2016  . Td 03/26/2003, 01/10/2011  . Tdap 01/10/2011  . Zoster 10/14/2006    TDAP status: Up to date  Flu Vaccine status: Up to date  Pneumococcal vaccine status: Up to date  Covid-19 vaccine status: Information provided on how to obtain vaccines.   Qualifies for Shingles Vaccine? Yes   Zostavax completed Yes   Shingrix Completed?: No  Screening Tests Health Maintenance  Topic Date Due  . DEXA SCAN  10/07/2017  . TETANUS/TDAP  01/09/2021  . INFLUENZA VACCINE  Completed  . COVID-19 Vaccine  Completed  . PNA vac Low Risk Adult  Completed  . HPV VACCINES  Aged Out    Health Maintenance  Health Maintenance Due  Topic Date Due  . DEXA SCAN  10/07/2017    Colorectal cancer screening: No longer  required.   Mammogram status: No longer required due to age.  Bone Density status: Currently due, declined order at this time.   Lung Cancer Screening: (Low Dose CT Chest recommended if Age 50-80 years, 30 pack-year currently smoking OR have quit w/in 15years.) does not qualify.  Additional Screening:  Vision Screening: Recommended annual ophthalmology exams for early detection of glaucoma and other disorders of the eye. Is the patient up to date with their annual eye exam?  Yes  Who is the provider or what is the name of the office in which the patient attends annual eye exams? Dr Wallace Going @ [place] If pt is not established with a provider, would they like to be referred to a provider to establish care? No .   Dental Screening: Recommended annual dental exams for proper oral hygiene  Community Resource Referral / Chronic Care Management: CRR required this visit?  No   CCM required this visit?  No      Plan:     I have personally reviewed and noted the following in the patient's chart:   . Medical and social history . Use of alcohol, tobacco or illicit drugs  . Current medications and supplements . Functional ability and status . Nutritional status . Physical activity . Advanced directives . List of other physicians . Hospitalizations, surgeries, and ER visits in previous 12 months . Vitals . Screenings to include cognitive, depression, and falls . Referrals and appointments  In addition, I have reviewed and discussed with patient certain preventive protocols, quality metrics, and best practice recommendations. A written personalized care plan for preventive services as well as general preventive health recommendations were provided to patient.     Chantrell Apsey [place], [place]   [id number]   Nurse Notes: Declined a DEXA order today.

## 2020-07-01 ENCOUNTER — Ambulatory Visit (INDEPENDENT_AMBULATORY_CARE_PROVIDER_SITE_OTHER): Payer: PPO

## 2020-07-01 ENCOUNTER — Other Ambulatory Visit: Payer: Self-pay

## 2020-07-01 DIAGNOSIS — Z0000 Encounter for general adult medical examination without abnormal findings: Secondary | ICD-10-CM

## 2020-07-01 NOTE — Patient Instructions (Signed)
Becky Prince , Thank you for taking time to come for your Medicare Wellness Visit. I appreciate your ongoing commitment to your health goals. Please review the following plan we discussed and let me know if I can assist you in the future.   Screening recommendations/referrals: Colonoscopy: No longer required.  Mammogram: No longer required.  Bone Density: Currently due, declined order at this time.  Recommended yearly ophthalmology/optometry visit for glaucoma screening and checkup Recommended yearly dental visit for hygiene and checkup  Vaccinations: Influenza vaccine: Done 02/18/21 Pneumococcal vaccine: Completed series Tdap vaccine: Up to date, due 12/2020 Shingles vaccine: Shingrix discussed. Please contact your pharmacy for coverage information.     Advanced directives: Please bring a copy of your POA (Power of Attorney) and/or Living Will to your next appointment.   Conditions/risks identified: Recommend increasing water intake to 6-8 8 oz glasses a day.   Next appointment: 09/11/83 @ 2:20 PM with Dr Brita Romp. Declined scheduling an AWV for 2023 at this time.   Preventive Care 85 Years and Older, Female Preventive care refers to lifestyle choices and visits with your health care provider that can promote health and wellness. What does preventive care include?  A yearly physical exam. This is also called an annual well check.  Dental exams once or twice a year.  Routine eye exams. Ask your health care provider how often you should have your eyes checked.  Personal lifestyle choices, including:  Daily care of your teeth and gums.  Regular physical activity.  Eating a healthy diet.  Avoiding tobacco and drug use.  Limiting alcohol use.  Practicing safe sex.  Taking low-dose aspirin every day.  Taking vitamin and mineral supplements as recommended by your health care provider. What happens during an annual well check? The services and screenings done by your health  care provider during your annual well check will depend on your age, overall health, lifestyle risk factors, and family history of disease. Counseling  Your health care provider may ask you questions about your:  Alcohol use.  Tobacco use.  Drug use.  Emotional well-being.  Home and relationship well-being.  Sexual activity.  Eating habits.  History of falls.  Memory and ability to understand (cognition).  Work and work Statistician.  Reproductive health. Screening  You may have the following tests or measurements:  Height, weight, and BMI.  Blood pressure.  Lipid and cholesterol levels. These may be checked every 5 years, or more frequently if you are over 63 years old.  Skin check.  Lung cancer screening. You may have this screening every year starting at age 90 if you have a 30-pack-year history of smoking and currently smoke or have quit within the past 15 years.  Fecal occult blood test (FOBT) of the stool. You may have this test every year starting at age 46.  Flexible sigmoidoscopy or colonoscopy. You may have a sigmoidoscopy every 5 years or a colonoscopy every 10 years starting at age 64.  Hepatitis C blood test.  Hepatitis B blood test.  Sexually transmitted disease (STD) testing.  Diabetes screening. This is done by checking your blood sugar (glucose) after you have not eaten for a while (fasting). You may have this done every 1-3 years.  Bone density scan. This is done to screen for osteoporosis. You may have this done starting at age 56.  Mammogram. This may be done every 1-2 years. Talk to your health care provider about how often you should have regular mammograms. Talk with your health  care provider about your test results, treatment options, and if necessary, the need for more tests. Vaccines  Your health care provider may recommend certain vaccines, such as:  Influenza vaccine. This is recommended every year.  Tetanus, diphtheria, and  acellular pertussis (Tdap, Td) vaccine. You may need a Td booster every 10 years.  Zoster vaccine. You may need this after age 16.  Pneumococcal 13-valent conjugate (PCV13) vaccine. One dose is recommended after age 35.  Pneumococcal polysaccharide (PPSV23) vaccine. One dose is recommended after age 65. Talk to your health care provider about which screenings and vaccines you need and how often you need them. This information is not intended to replace advice given to you by your health care provider. Make sure you discuss any questions you have with your health care provider. Document Released: 05/10/2015 Document Revised: 01/01/2016 Document Reviewed: 02/12/2015 Elsevier Interactive Patient Education  2017 [employer] Prevention in the Home Falls can cause injuries. They can happen to people of all ages. There are many things you can do to make your home safe and to help prevent falls. What can I do on the outside of my home?  Regularly fix the edges of walkways and driveways and fix any cracks.  Remove anything that might make you trip as you walk through a door, such as a raised step or threshold.  Trim any bushes or trees on the path to your home.  Use bright outdoor lighting.  Clear any walking paths of anything that might make someone trip, such as rocks or tools.  Regularly check to see if handrails are loose or broken. Make sure that both sides of any steps have handrails.  Any raised decks and porches should have guardrails on the edges.  Have any leaves, snow, or ice cleared regularly.  Use sand or salt on walking paths during winter.  Clean up any spills in your garage right away. This includes oil or grease spills. What can I do in the bathroom?  Use night lights.  Install grab bars by the toilet and in the tub and shower. Do not use towel bars as grab bars.  Use non-skid mats or decals in the tub or shower.  If you need to sit down in the shower, use  a plastic, non-slip stool.  Keep the floor dry. Clean up any water that spills on the floor as soon as it happens.  Remove soap buildup in the tub or shower regularly.  Attach bath mats securely with double-sided non-slip rug tape.  Do not have throw rugs and other things on the floor that can make you trip. What can I do in the bedroom?  Use night lights.  Make sure that you have a light by your bed that is easy to reach.  Do not use any sheets or blankets that are too big for your bed. They should not hang down onto the floor.  Have a firm chair that has side arms. You can use this for support while you get dressed.  Do not have throw rugs and other things on the floor that can make you trip. What can I do in the kitchen?  Clean up any spills right away.  Avoid walking on wet floors.  Keep items that you use a lot in easy-to-reach places.  If you need to reach something above you, use a strong step stool that has a grab bar.  Keep electrical cords out of the way.  Do not use floor  polish or wax that makes floors slippery. If you must use wax, use non-skid floor wax.  Do not have throw rugs and other things on the floor that can make you trip. What can I do with my stairs?  Do not leave any items on the stairs.  Make sure that there are handrails on both sides of the stairs and use them. Fix handrails that are broken or loose. Make sure that handrails are as long as the stairways.  Check any carpeting to make sure that it is firmly attached to the stairs. Fix any carpet that is loose or worn.  Avoid having throw rugs at the top or bottom of the stairs. If you do have throw rugs, attach them to the floor with carpet tape.  Make sure that you have a light switch at the top of the stairs and the bottom of the stairs. If you do not have them, ask someone to add them for you. What else can I do to help prevent falls?  Wear shoes that:  Do not have high heels.  Have  rubber bottoms.  Are comfortable and fit you well.  Are closed at the toe. Do not wear sandals.  If you use a stepladder:  Make sure that it is fully opened. Do not climb a closed stepladder.  Make sure that both sides of the stepladder are locked into place.  Ask someone to hold it for you, if possible.  Clearly mark and make sure that you can see:  Any grab bars or handrails.  First and last steps.  Where the edge of each step is.  Use tools that help you move around (mobility aids) if they are needed. These include:  Canes.  Walkers.  Scooters.  Crutches.  Turn on the lights when you go into a dark area. Replace any light bulbs as soon as they burn out.  Set up your furniture so you have a clear path. Avoid moving your furniture around.  If any of your floors are uneven, fix them.  If there are any pets around you, be aware of where they are.  Review your medicines with your doctor. Some medicines can make you feel dizzy. This can increase your chance of falling. Ask your doctor what other things that you can do to help prevent falls. This information is not intended to replace advice given to you by your health care provider. Make sure you discuss any questions you have with your health care provider. Document Released: 02/07/2009 Document Revised: 09/19/2015 Document Reviewed: 05/18/2014 Elsevier Interactive Patient Education  2017 [employer].

## 2020-07-30 DIAGNOSIS — M5136 Other intervertebral disc degeneration, lumbar region: Secondary | ICD-10-CM | POA: Diagnosis not present

## 2020-07-30 DIAGNOSIS — M5416 Radiculopathy, lumbar region: Secondary | ICD-10-CM | POA: Diagnosis not present

## 2020-08-07 ENCOUNTER — Other Ambulatory Visit: Payer: Self-pay | Admitting: Family Medicine

## 2020-08-07 DIAGNOSIS — F4329 Adjustment disorder with other symptoms: Secondary | ICD-10-CM

## 2020-08-13 DIAGNOSIS — N1832 Chronic kidney disease, stage 3b: Secondary | ICD-10-CM | POA: Diagnosis not present

## 2020-08-13 DIAGNOSIS — I10 Essential (primary) hypertension: Secondary | ICD-10-CM | POA: Diagnosis not present

## 2020-09-10 ENCOUNTER — Other Ambulatory Visit: Payer: Self-pay

## 2020-09-10 ENCOUNTER — Ambulatory Visit: Payer: Self-pay

## 2020-09-10 ENCOUNTER — Ambulatory Visit (INDEPENDENT_AMBULATORY_CARE_PROVIDER_SITE_OTHER): Payer: PPO | Admitting: Family Medicine

## 2020-09-10 ENCOUNTER — Encounter: Payer: Self-pay | Admitting: Family Medicine

## 2020-09-10 VITALS — BP 119/58 | HR 89 | Temp 97.8°F | Resp 16 | Ht 68.0 in | Wt 129.7 lb

## 2020-09-10 DIAGNOSIS — M810 Age-related osteoporosis without current pathological fracture: Secondary | ICD-10-CM | POA: Diagnosis not present

## 2020-09-10 DIAGNOSIS — G309 Alzheimer's disease, unspecified: Secondary | ICD-10-CM

## 2020-09-10 DIAGNOSIS — N1832 Chronic kidney disease, stage 3b: Secondary | ICD-10-CM | POA: Insufficient documentation

## 2020-09-10 DIAGNOSIS — M25551 Pain in right hip: Secondary | ICD-10-CM | POA: Diagnosis not present

## 2020-09-10 DIAGNOSIS — E7800 Pure hypercholesterolemia, unspecified: Secondary | ICD-10-CM | POA: Diagnosis not present

## 2020-09-10 DIAGNOSIS — I951 Orthostatic hypotension: Secondary | ICD-10-CM

## 2020-09-10 DIAGNOSIS — F0150 Vascular dementia without behavioral disturbance: Secondary | ICD-10-CM | POA: Diagnosis not present

## 2020-09-10 DIAGNOSIS — I10 Essential (primary) hypertension: Secondary | ICD-10-CM

## 2020-09-10 DIAGNOSIS — E0590 Thyrotoxicosis, unspecified without thyrotoxic crisis or storm: Secondary | ICD-10-CM

## 2020-09-10 DIAGNOSIS — F4323 Adjustment disorder with mixed anxiety and depressed mood: Secondary | ICD-10-CM

## 2020-09-10 DIAGNOSIS — M25552 Pain in left hip: Secondary | ICD-10-CM | POA: Insufficient documentation

## 2020-09-10 DIAGNOSIS — F0280 Dementia in other diseases classified elsewhere without behavioral disturbance: Secondary | ICD-10-CM | POA: Diagnosis not present

## 2020-09-10 DIAGNOSIS — Z0000 Encounter for general adult medical examination without abnormal findings: Secondary | ICD-10-CM | POA: Diagnosis not present

## 2020-09-10 NOTE — Assessment & Plan Note (Signed)
Previously well controlled Continue statin Repeat FLP and CMP Goal LDL < 100

## 2020-09-10 NOTE — Progress Notes (Signed)
Annual Wellness Visit     Patient: Becky Prince, Female    DOB: Apr 03, 1926, 87 y.o.   MRN: [id number] Visit Date: 09/10/2020  Today's Provider: [name], MD   Chief Complaint  Patient presents with  . Annual Exam   Subjective    HPI Becky Prince is a 87 y.o. female who presents today for her Annual CPE. She reports consuming a general diet. She denies participating in regular exercise. She generally feels fairly well.   Hip Pain She reports sleeping poorly, patient reports that she has difficulty sleeping through the night. Patient states that she would like to address today chronic hip pain in both hips, she states that she was previously receiving steroid injections in her hips at [facility] every 3 months. She says overall they are helpful, but they don't take away all the pain.   Dizziness She also complains of "dizzy spells" that have been occurring over the past 5 days or less and states that it is affecting her memory. She has episodes of dizziness with bending or position change. She says yesterday she felt like she was going to pass out.  She has a hard time with memory and reports that she believes that she had a near syncope episode, she also recalls a fall that has occurred in the past 5 days in her home but does not recall what took place prior to falling.   Blood Pressure She checked her blood pressure after falling yesterday. She reports it being 97 systolic and she feels like her blood pressure has been low for a while. She says she checks her blood pressure when she doesn't feel well.    Last reported  Diagnostic Mammogram 10/30/15 Colonoscopy 07/26/2007 BMD 10/08/15 Patient Active Problem List   Diagnosis Date Noted  . Bilateral hip pain 09/10/2020  . Stage 3b chronic kidney disease ([place]) 09/10/2020  . Encounter for annual wellness visit (AWV) in Medicare patient 09/10/2020  . Bilateral pubic rami fractures ([place]) 10/03/2019  . Adjustment  disorder 02/13/2019  . Night sweats 02/13/2019  . Mixed dementia ([place]) 03/17/2018  . Dupuytren's contracture of both hands 03/17/2018  . Cerebrovascular disease 10/06/2016  . Multinodular goiter 10/06/2016  . TIA (transient ischemic attack) 09/28/2016  . Osteoporosis 10/14/2015  . Constipation 04/12/2015  . Contracture of finger joint 04/12/2015  . Hypercholesteremia 04/12/2015  . BP (high blood pressure) 04/12/2015  . Allergic rhinitis, seasonal 04/12/2015  . Subclinical hyperthyroidism 11/16/2014   Past Medical History:  Diagnosis Date  . Depression   . Environmental allergies   . Hypercholesteremia   . Hypertension    Social History   Tobacco Use  . Smoking status: Never Smoker  . Smokeless tobacco: Never Used  Vaping Use  . Vaping Use: Never used  Substance Use Topics  . Alcohol use: No    Alcohol/week: 0.0 standard drinks  . Drug use: No   Family History  Problem Relation Age of Onset  . Heart disease Mother        CHF  . Hypertension Mother   . Cancer Father        prostate  . Cancer Sister        breast  . Kidney disease Brother   . Breast cancer Neg Hx    Allergies  Allergen Reactions  . Naproxen Swelling    Had lip swelling 20 yrs ago - not sure if caused by oxycodone or naproxen  . Oxycodone-Aspirin Swelling  Had lip swelling 20 yrs ago. Not sure if caused by oxycodone or naproxen.  . Prednisone Diarrhea       Medications: Outpatient Medications Prior to Visit  Medication Sig  . acetaminophen (TYLENOL) 500 MG tablet Take 2 tablets (1,000 mg total) by mouth 3 (three) times daily as needed for mild pain (or Fever >/= 101).  . ASPIRIN 81 PO Take 81 mg by mouth daily at 6 (six) AM.  . atorvastatin (LIPITOR) 40 MG tablet Take 1 tablet (40 mg total) by mouth daily.  . citalopram (CELEXA) 10 MG tablet TAKE 1 TABLET BY MOUTH EVERY DAY  . donepezil (ARICEPT) 10 MG tablet TAKE 1 TABLET BY MOUTH EVERYDAY AT BEDTIME  . fluticasone (FLONASE) 50 MCG/ACT  nasal spray Place 1 spray into both nostrils daily.  Marland Kitchen HYDROcodone-acetaminophen (NORCO/VICODIN) 5-325 MG tablet Take 0.5 tablets by mouth 2 (two) times daily as needed.  . mirtazapine (REMERON) 15 MG tablet TAKE 1 TABLET BY MOUTH EVERYDAY AT BEDTIME  . spironolactone (ALDACTONE) 25 MG tablet TAKE 1 TABLET BY MOUTH EVERY DAY  . traMADol (ULTRAM) 50 MG tablet Take 50 mg by mouth every 6 (six) hours as needed.  . [DISCONTINUED] amLODipine (NORVASC) 5 MG tablet Take 1 tablet (5 mg total) by mouth daily.   No facility-administered medications prior to visit.    Allergies  Allergen Reactions  . Naproxen Swelling    Had lip swelling 20 yrs ago - not sure if caused by oxycodone or naproxen  . Oxycodone-Aspirin Swelling    Had lip swelling 20 yrs ago. Not sure if caused by oxycodone or naproxen.  . Prednisone Diarrhea    Patient Care Team: Virginia Crews, MD as PCP - General (Family Medicine) [name], MD as Referring Physician (Physical Medicine and Rehabilitation) Murlean Iba, MD (Nephrology) Leandrew Koyanagi, MD as Referring Physician (Ophthalmology) Meeler, Sherren Kerns, FNP (Family Medicine)  Review of Systems  Constitutional: Positive for diaphoresis and fatigue. Negative for chills and fever.  HENT: Positive for hearing loss and mouth sores. Negative for ear pain, rhinorrhea, sinus pressure, sinus pain and sore throat.   Eyes: Negative for pain.  Respiratory: Negative for cough, chest tightness, shortness of breath and wheezing.   Cardiovascular: Negative for chest pain, palpitations and leg swelling.  Gastrointestinal: Positive for constipation. Negative for abdominal pain, blood in stool, diarrhea, nausea and vomiting.  Genitourinary: Positive for frequency. Negative for dysuria, flank pain, hematuria, pelvic pain and urgency.  Musculoskeletal: Positive for arthralgias, back pain, gait problem and myalgias. Negative for neck pain and neck stiffness.   Neurological: Positive for dizziness and light-headedness. Negative for seizures, syncope, weakness, numbness and headaches.    {Labs  Heme  Chem  Endocrine  Serology  Results Review (optional):[id number]::" "}    Objective    Vitals: BP (!) 119/58   Pulse 89   Temp 97.8 F (36.6 C) (Oral)   Resp 16   Ht '5\' 8"'$  (1.727 m)   Wt 129 lb 11.2 oz (58.8 kg)   BMI 19.72 kg/m    Orthostatic VS for the past 72 hrs (Last 3 readings):  Orthostatic BP Patient Position BP Location Cuff Size Orthostatic Pulse  09/10/20 0324 141/68 Supine Right Arm Normal --  09/10/20 0322 119/69 Standing Right Arm Normal 93  09/10/20 0319 140/63 Sitting Right Arm Normal 72   Physical Exam Vitals reviewed.  Constitutional:      General: She is not in acute distress.    Appearance: Normal appearance. She is well-developed.  She is not diaphoretic.  HENT:     Head: Normocephalic and atraumatic.     Right Ear: Tympanic membrane, ear canal and external ear normal.     Left Ear: Tympanic membrane, ear canal and external ear normal.     Nose: Nose normal.     Mouth/Throat:     Mouth: Mucous membranes are moist.     Pharynx: Oropharynx is clear. No oropharyngeal exudate.  Eyes:     General: No scleral icterus.    Conjunctiva/sclera: Conjunctivae normal.     Pupils: Pupils are equal, round, and reactive to light.  Neck:     Thyroid: No thyromegaly.  Cardiovascular:     Rate and Rhythm: Normal rate and regular rhythm.     Pulses: Normal pulses.     Heart sounds: Normal heart sounds. No murmur heard.   Pulmonary:     Effort: Pulmonary effort is normal. No respiratory distress.     Breath sounds: Normal breath sounds. No wheezing or rales.  Abdominal:     General: There is no distension.     Palpations: Abdomen is soft.     Tenderness: There is no abdominal tenderness.  Musculoskeletal:        General: No deformity.     Cervical back: Neck supple.     Right lower leg: No edema.     Left lower leg: No  edema.  Lymphadenopathy:     Cervical: No cervical adenopathy.  Skin:    General: Skin is warm and dry.     Findings: No rash.  Neurological:     Mental Status: She is alert and oriented to person, place, and time. Mental status is at baseline.     Sensory: No sensory deficit.     Motor: No weakness.     Gait: Gait normal.  Psychiatric:        Mood and Affect: Mood normal.        Behavior: Behavior normal.        Thought Content: Thought content normal.     Most recent functional status assessment: In your present state of health, do you have any difficulty performing the following activities: 09/10/2020  Hearing? Y  Vision? N  Difficulty concentrating or making decisions? Y  Comment -  Walking or climbing stairs? Y  Comment -  Dressing or bathing? N  Doing errands, shopping? N  Preparing Food and eating ? -  Using the Toilet? -  In the past six months, have you accidently leaked urine? -  Comment -  Do you have problems with loss of bowel control? -  Managing your Medications? -  Managing your Finances? -  Comment -  Housekeeping or managing your Housekeeping? -  Comment -  Some recent data might be hidden   Most recent fall risk assessment: Fall Risk  09/10/2020  Falls in the past year? 1  Number falls in past yr: 0  Injury with Fall? 1  Follow up -    Most recent depression screenings: PHQ 2/9 Scores 09/10/2020 07/01/2020  PHQ - 2 Score - 1  PHQ- 9 Score - -  Exception Documentation Patient refusal -   Most recent cognitive screening: No flowsheet data found. Most recent Audit-C alcohol use screening Alcohol Use Disorder Test (AUDIT) 09/10/2020  1. How often do you have a drink containing alcohol? 0  2. How many drinks containing alcohol do you have on a typical day when you are drinking? 0  3. How often  do you have six or more drinks on one occasion? 0  AUDIT-C Score 0  Alcohol Brief Interventions/Follow-up -   A score of 3 or more in women, and 4 or more  in men indicates increased risk for alcohol abuse, EXCEPT if all of the points are from question 1   No results found for any visits on 09/10/20.  Assessment & Plan     Problem List Items Addressed This Visit      Cardiovascular and Mediastinum   BP (high blood pressure)    Running low and seems to be the cause of her dizziness and near syncope Also some orthostatic hypotension Continue spironolactone at current dose DC amlodipine Will request last labs from nephrology       Mixed dementia ([place])    Chronic and stable Continue Aricept        Endocrine   Subclinical hyperthyroidism    Not on medication Recheck TSH and free T4      Relevant Orders   TSH + free T4     Musculoskeletal and Integument   Osteoporosis    Shared decision making regarding repeat DEXA which patient declines at this time        Genitourinary   Stage 3b chronic kidney disease ([place])   Relevant Orders   CBC w/Diff/Platelet     Other   Hypercholesteremia    Previously well controlled Continue statin Repeat FLP and CMP Goal LDL < 100      Relevant Orders   Lipid panel   Adjustment disorder    Chronic and fairly well controlled Continue Celexa and Remeron at current dose      Bilateral hip pain    [facility]      Encounter for annual wellness visit (AWV) in Medicare patient - Primary    Other Visit Diagnoses    Orthostatic hypotension           CPE done today including the all of the following: Reviewed patient's Family Medical History Reviewed and updated list of patient's medical providers Assessment of cognitive impairment was done Assessed patient's functional ability Established a written schedule for health screening [place] Completed and Reviewed  Exercise Activities and Dietary recommendations Goals    . Increase water intake     Recommend increasing water intake to 6-8 8 oz  glasses a day.        Immunization History  Administered Date(s) Administered  . Influenza, High Dose Seasonal PF 02/09/2015, 03/06/2016, 01/18/2017, 03/17/2018, 12/27/2018, 02/19/2020  . PFIZER(Purple Top)SARS-COV-2 Vaccination 05/16/2019, 06/07/2019, 01/26/2020  . Pneumococcal Conjugate-13 08/29/2014  . Pneumococcal Polysaccharide-23 03/06/2016  . Td 03/26/2003, 01/10/2011  . Tdap 01/10/2011  . Zoster 10/14/2006    Health Maintenance  Topic Date Due  . DEXA SCAN  10/07/2017  . INFLUENZA VACCINE  11/25/2020  . TETANUS/TDAP  01/09/2021  . COVID-19 Vaccine  Completed  . PNA vac Low Risk Adult  Completed  . HPV VACCINES  Aged Out     Discussed health benefits of physical activity, and encouraged her to engage in regular exercise appropriate for her age and condition.      Return in about 3 months (around 12/11/2020) for chronic disease f/u.      I,Essence Turner,acting as a Education administrator for [name], MD.,have documented all relevant documentation on the behalf of [name], MD,as directed by  [name], MD while in the presence of [name], MD.  I, [name], MD, have reviewed all documentation for this visit. The documentation on 09/10/20 for the exam, diagnosis, procedures, and orders are all accurate and complete.   Adrien Shankar, [name], MD, MPH [facility]

## 2020-09-10 NOTE — Assessment & Plan Note (Signed)
Chronic and fairly well controlled Continue Celexa and Remeron at current dose

## 2020-09-10 NOTE — Assessment & Plan Note (Signed)
Continue to work with [facility]

## 2020-09-10 NOTE — Telephone Encounter (Signed)
Appears that she has appointment this afternoon. We can discuss then.

## 2020-09-10 NOTE — Assessment & Plan Note (Signed)
Shared decision making regarding repeat DEXA which patient declines at this time

## 2020-09-10 NOTE — Patient Instructions (Signed)
Stop amlodipine.

## 2020-09-10 NOTE — Assessment & Plan Note (Signed)
Not on medication Recheck TSH and free T4

## 2020-09-10 NOTE — Assessment & Plan Note (Signed)
Chronic and stable Continue Aricept

## 2020-09-10 NOTE — Assessment & Plan Note (Signed)
Running low and seems to be the cause of her dizziness and near syncope Also some orthostatic hypotension Continue spironolactone at current dose DC amlodipine Will request last labs from nephrology

## 2020-09-10 NOTE — Telephone Encounter (Signed)
Pt. Reports she had 2 "dizzy spells last night, I thought I was going to faint." Did not faint. Is not currently dizzy. Feels weak. Has an appointment today for a Physical. States "I might cancel that appointment." Instructed to have someone bring her to her appointment . States she will "think about it and call back." "My son and daughter are out of town and I don't want to bother them." States she does not feel like she needs to go to ED. Please advise pt.   Answer Assessment - Initial Assessment Questions 1. DESCRIPTION: "Describe your dizziness."     Dizzy 2. LIGHTHEADED: "Do you feel lightheaded?" (e.g., somewhat faint, woozy, weak upon standing)     Weak 3. VERTIGO: "Do you feel like either you or the room is spinning or tilting?" (i.e. vertigo)     No 4. SEVERITY: "How bad is it?"  "Do you feel like you are going to faint?" "Can you stand and walk?"   - MILD: Feels slightly dizzy, but walking normally.   - MODERATE: Feels unsteady when walking, but not falling; interferes with normal activities (e.g., school, work).   - SEVERE: Unable to walk without falling, or requires assistance to walk without falling; feels like passing out now.      Mild 5. ONSET:  "When did the dizziness begin?"     Last night 6. AGGRAVATING FACTORS: "Does anything make it worse?" (e.g., standing, change in head position)     Standing 7. HEART RATE: "Can you tell me your heart rate?" "How many beats in 15 seconds?"  (Note: not all patients can do this)       No 8. CAUSE: "What do you think is causing the dizziness?"     Unsure 9. RECURRENT SYMPTOM: "Have you had dizziness before?" If Yes, ask: "When was the last time?" "What happened that time?"     No 10. OTHER SYMPTOMS: "Do you have any other symptoms?" (e.g., fever, chest pain, vomiting, diarrhea, bleeding)       Mild headache 11. PREGNANCY: "Is there any chance you are pregnant?" "When was your last menstrual period?"       No  Protocols used: DIZZINESS  [facility]

## 2020-09-11 LAB — CBC WITH DIFFERENTIAL/PLATELET
Basophils Absolute: 0 10*3/uL (ref 0.0–0.2)
Basos: 0 %
EOS (ABSOLUTE): 0.1 10*3/uL (ref 0.0–0.4)
Eos: 1 %
Hematocrit: 43.2 % (ref 34.0–46.6)
Hemoglobin: 14.7 g/dL (ref 11.1–15.9)
Immature Grans (Abs): 0 10*3/uL (ref 0.0–0.1)
Immature Granulocytes: 0 %
Lymphocytes Absolute: 2.8 10*3/uL (ref 0.7–3.1)
Lymphs: 39 %
MCH: 32 pg (ref 26.6–33.0)
MCHC: 34 g/dL (ref 31.5–35.7)
MCV: 94 fL (ref 79–97)
Monocytes Absolute: 0.7 10*3/uL (ref 0.1–0.9)
Monocytes: 10 %
Neutrophils Absolute: 3.5 10*3/uL (ref 1.4–7.0)
Neutrophils: 50 %
Platelets: 265 10*3/uL (ref 150–450)
RBC: 4.6 x10E6/uL (ref 3.77–5.28)
RDW: 11.2 % — ABNORMAL LOW (ref 11.7–15.4)
WBC: 7.1 10*3/uL (ref 3.4–10.8)

## 2020-09-11 LAB — LIPID PANEL
Chol/HDL Ratio: 2.5 ratio (ref 0.0–4.4)
Cholesterol, Total: 150 mg/dL (ref 100–199)
HDL: 59 mg/dL (ref >39)
LDL Chol Calc (NIH): 73 mg/dL (ref 0–99)
Triglycerides: 95 mg/dL (ref 0–149)
VLDL Cholesterol Cal: 18 mg/dL (ref 5–40)

## 2020-09-11 LAB — TSH+FREE T4
Free T4: 1.34 ng/dL (ref 0.82–1.77)
TSH: 0.789 u[IU]/mL (ref 0.450–4.500)

## 2020-09-30 IMAGING — CR DG HIP (WITH OR WITHOUT PELVIS) 2-3V*L*
1 series · 3 of 3 positions shown · non-contrast
Comparison: None.

CLINICAL DATA: Left hip pain after trip and fall today. Initial
encounter.

EXAM:
DG HIP (WITH OR WITHOUT PELVIS) 2-3V LEFT

[Series 1: dg hip unilat w or w/o pelvis 2-3 views  · non-contrast · 0.14mm/px · 3 of 3 slices shown]
[im 1/3]
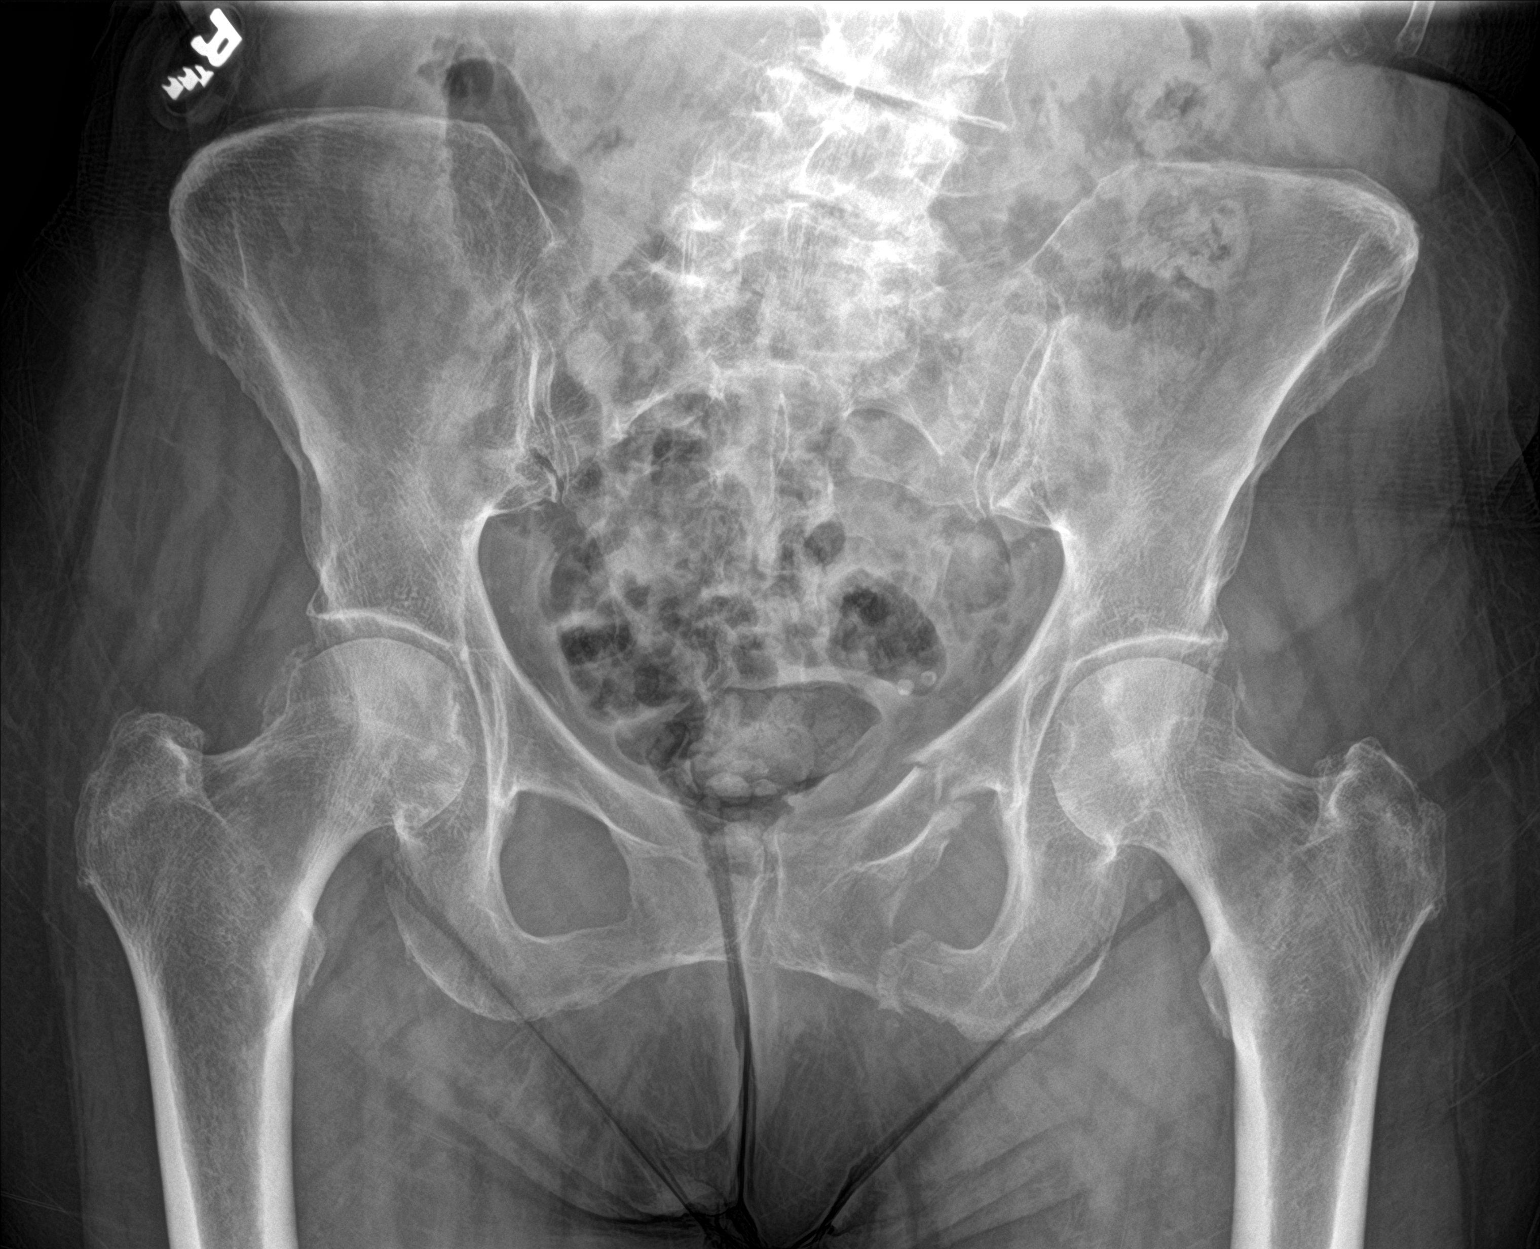
[im 2/3]
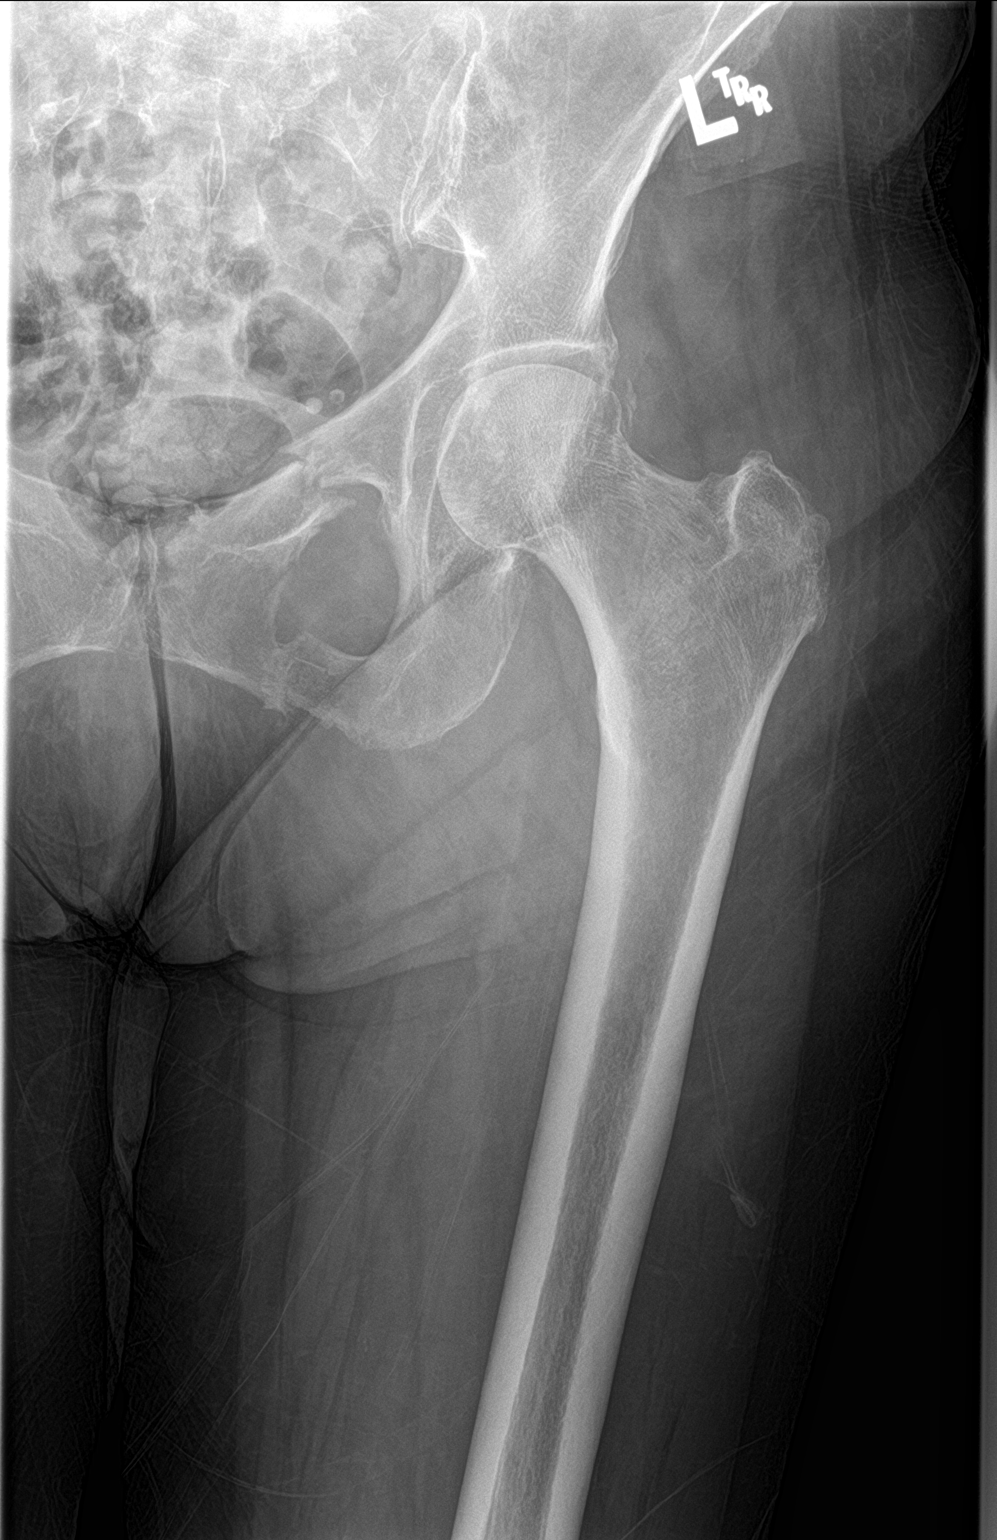
[im 3/3]
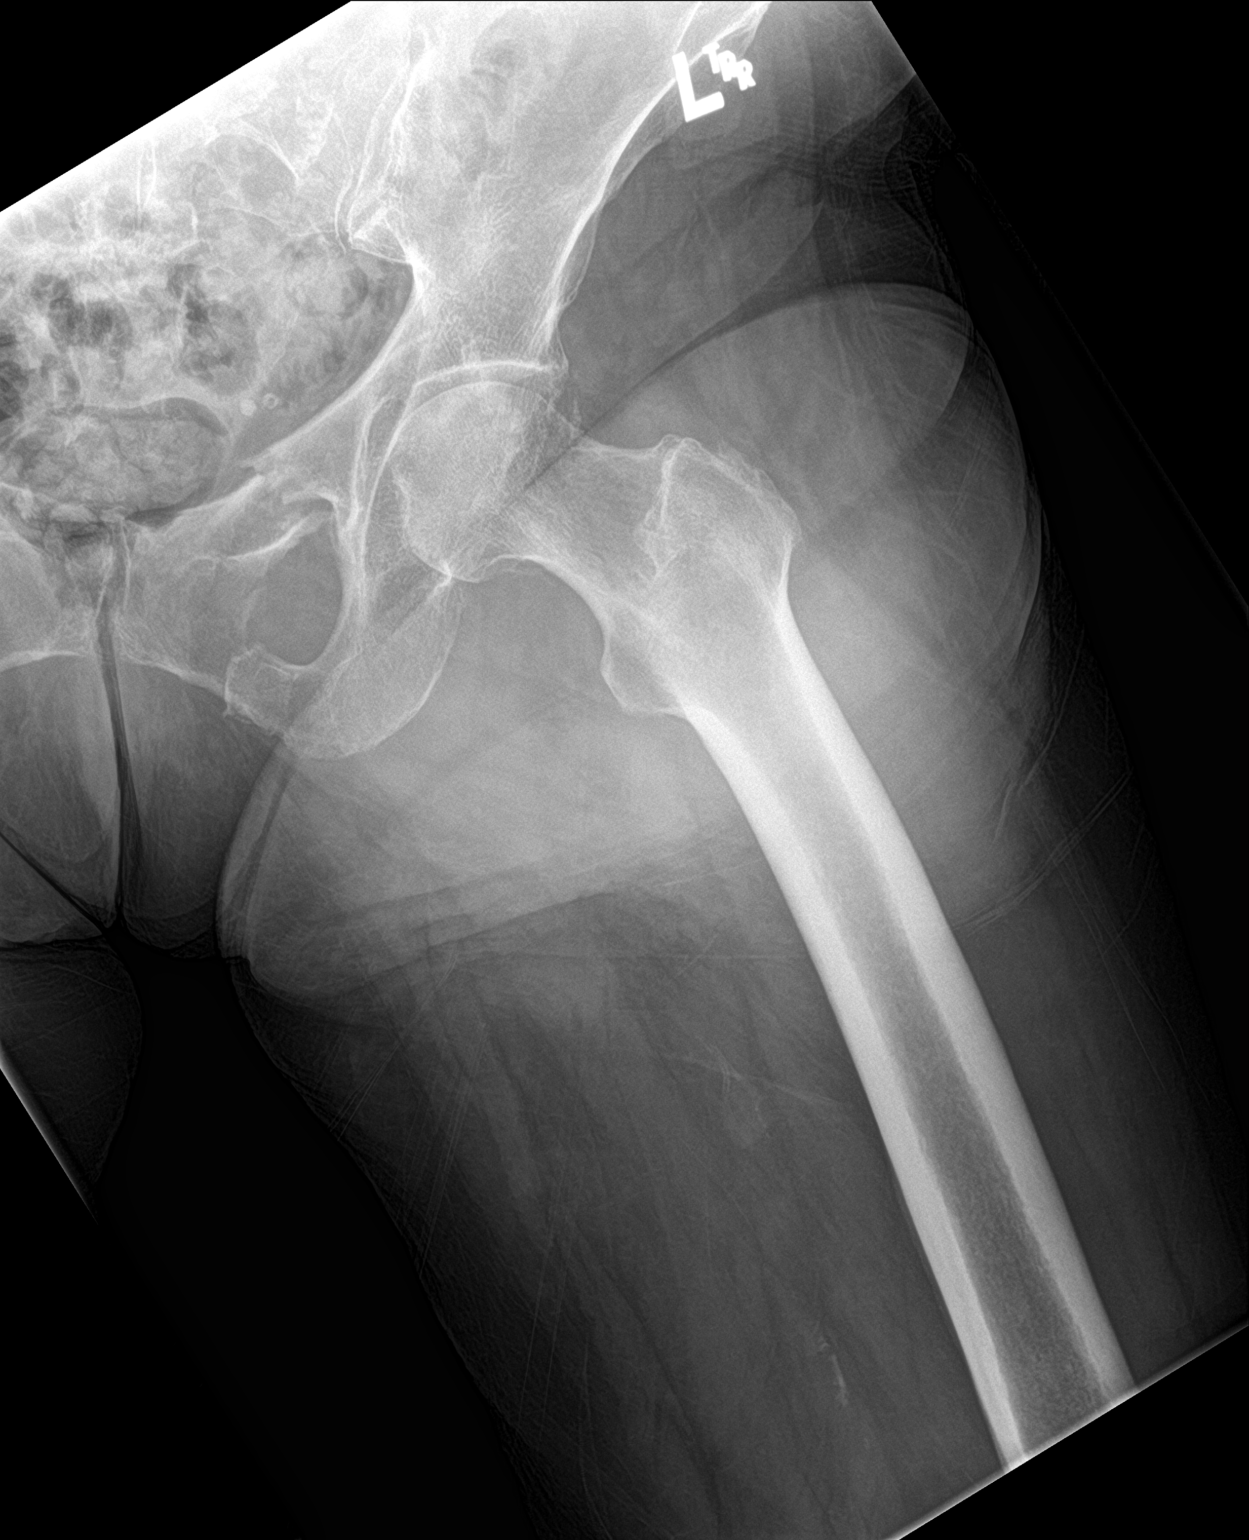

[3 of 3 positions shown; findings below may reference images not displayed]

FINDINGS: The patient has acute left superior and inferior pubic ramus
fractures. No other fracture is identified. In particular, no hip
fracture is seen. Both hips are located. There is some
chondrocalcinosis about the hips. Convex left lumbar scoliosis and
degenerative change noted.
IMPRESSION: Acute left superior and inferior pubic ramus fractures. Negative for
hip fracture.

## 2020-10-07 DIAGNOSIS — M5136 Other intervertebral disc degeneration, lumbar region: Secondary | ICD-10-CM | POA: Diagnosis not present

## 2020-10-07 DIAGNOSIS — M5416 Radiculopathy, lumbar region: Secondary | ICD-10-CM | POA: Diagnosis not present

## 2020-10-07 DIAGNOSIS — M48062 Spinal stenosis, lumbar region with neurogenic claudication: Secondary | ICD-10-CM | POA: Diagnosis not present

## 2020-10-27 ENCOUNTER — Other Ambulatory Visit: Payer: Self-pay | Admitting: Family Medicine

## 2020-10-27 DIAGNOSIS — F4329 Adjustment disorder with other symptoms: Secondary | ICD-10-CM

## 2020-10-27 NOTE — Telephone Encounter (Signed)
Requested medication (s) are due for refill today: yes  Requested medication (s) are on the active medication list: yes  Last refill:  06/24/20  Future visit scheduled: yes  Notes to clinic:  overdue lab work (AST and ALT)   Requested Prescriptions  Pending Prescriptions Disp Refills   mirtazapine (REMERON) 15 MG tablet [Pharmacy Med Name: MIRTAZAPINE 15 MG TABLET] 90 tablet     Sig: TAKE 1 TABLET BY MOUTH EVERYDAY AT BEDTIME      Psychiatry: Antidepressants - mirtazapine Failed - 10/27/2020 12:05 PM      Failed - AST in normal range and within 360 days    AST  Date Value Ref Range Status  10/03/2019 24 15 - 41 U/L Final          Failed - ALT in normal range and within 360 days    ALT  Date Value Ref Range Status  10/03/2019 18 0 - 44 U/L Final          Passed - Triglycerides in normal range and within 360 days    Triglycerides  Date Value Ref Range Status  09/10/2020 95 0 - 149 mg/dL Final          Passed - Total Cholesterol in normal range and within 360 days    Cholesterol, Total  Date Value Ref Range Status  09/10/2020 150 100 - 199 mg/dL Final          Passed - WBC in normal range and within 360 days    WBC  Date Value Ref Range Status  09/10/2020 7.1 3.4 - 10.8 x10E3/uL Final  10/06/2019 7.4 4.0 - 10.5 K/uL Final          Passed - Valid encounter within last 6 months    Recent Outpatient Visits           1 month ago Encounter for annual physical exam   [facility], [name], MD   8 months ago Primary hypertension   [facility] [place], [name], MD   10 months ago Closed bilateral fracture of pubic rami with routine healing, subsequent encounter   [facility], [name], MD   1 year ago Essential hypertension   [place], [name], MD   1 year ago Night sweats   [facility] [place], [name], MD       Future Appointments              In 1 month Bacig[place], [name], MD [facility]              Signed Prescriptions Disp Refills   citalopram (CELEXA) 10 MG tablet 90 tablet 0    Sig: TAKE 1 TABLET BY MOUTH EVERY DAY      Psychiatry:  Antidepressants - SSRI Passed - 10/27/2020 12:05 PM      Passed - Valid encounter within last 6 months    Recent Outpatient Visits           1 month ago Encounter for annual physical exam   [facility] [place], [name], MD   8 months ago Primary hypertension   [facility] [place], [name], MD   10 months ago Closed bilateral fracture of pubic rami with routine healing, subsequent encounter   [facility], [name], MD   1 year ago Essential hypertension   [place], [name], MD   1 year ago  Night sweats   [facility], [name], MD       Future Appointments             In 1 month Bacigalupo, [name], MD [facility]               spironolactone (ALDACTONE) 25 MG tablet 90 tablet 0    Sig: TAKE 1 TABLET BY MOUTH EVERY DAY      Cardiovascular: Diuretics - Aldosterone Antagonist Failed - 10/27/2020 12:05 PM      Failed - Cr in normal range and within 360 days    Creat  Date Value Ref Range Status  03/11/2017 1.42 (H) 0.60 - 0.88 mg/dL Final    Comment:    For patients >63 years of age, the reference limit for Creatinine is approximately 13% higher for people identified as African-American. .    Creatinine, Ser  Date Value Ref Range Status  03/14/2020 1.46 (H) 0.57 - 1.00 mg/dL Final          Passed - K in normal range and within 360 days    Potassium  Date Value Ref Range Status  03/14/2020 4.6 3.5 - 5.2 mmol/L Final          Passed - Na in normal range and within 360 days    Sodium  Date Value Ref Range Status  03/14/2020 139 134 - 144 mmol/L Final          Passed - Last BP in normal range     BP Readings from Last 1 Encounters:  09/10/20 (!) 119/58          Passed - Valid encounter within last 6 months    Recent Outpatient Visits           1 month ago Encounter for annual physical exam   [facility], [name], MD   8 months ago Primary hypertension   [facility] [place], [name], MD   10 months ago Closed bilateral fracture of pubic rami with routine healing, subsequent encounter   [facility], [name], MD   1 year ago Essential hypertension   [place], [name], MD   1 year ago Night sweats   [facility] [place], [name], MD       Future Appointments             In 1 month Bacigalupo, [name], MD [facility] [place]

## 2020-10-27 NOTE — Telephone Encounter (Signed)
Requested Prescriptions  Pending Prescriptions Disp Refills  . citalopram (CELEXA) 10 MG tablet [Pharmacy Med Name: CITALOPRAM HBR 10 MG TABLET] 90 tablet 0    Sig: TAKE 1 TABLET BY MOUTH EVERY DAY     Psychiatry:  Antidepressants - SSRI Passed - 10/27/2020 12:05 PM      Passed - Valid encounter within last 6 months    Recent Outpatient Visits          1 month ago Encounter for annual physical exam   [facility] [place], [name], MD   8 months ago Primary hypertension   [facility], [name], MD   10 months ago Closed bilateral fracture of pubic rami with routine healing, subsequent encounter   [facility], [name], MD   1 year ago Essential hypertension   [place], [name], MD   1 year ago Night sweats   [place], [name], MD      Future Appointments            In 1 month Bacigalupo, [name], MD [facility]           . mirtazapine (REMERON) 15 MG tablet [Pharmacy Med Name: MIRTAZAPINE 15 MG TABLET] 90 tablet     Sig: TAKE 1 TABLET BY MOUTH EVERYDAY AT BEDTIME     Psychiatry: Antidepressants - mirtazapine Failed - 10/27/2020 12:05 PM      Failed - AST in normal range and within 360 days    AST  Date Value Ref Range Status  10/03/2019 24 15 - 41 U/L Final         Failed - ALT in normal range and within 360 days    ALT  Date Value Ref Range Status  10/03/2019 18 0 - 44 U/L Final         Passed - Triglycerides in normal range and within 360 days    Triglycerides  Date Value Ref Range Status  09/10/2020 95 0 - 149 mg/dL Final         Passed - Total Cholesterol in normal range and within 360 days    Cholesterol, Total  Date Value Ref Range Status  09/10/2020 150 100 - 199 mg/dL Final         Passed - WBC in normal range and within 360 days    WBC  Date Value Ref Range Status  09/10/2020 7.1 3.4 - 10.8 x10E3/uL  Final  10/06/2019 7.4 4.0 - 10.5 K/uL Final         Passed - Valid encounter within last 6 months    Recent Outpatient Visits          1 month ago Encounter for annual physical exam   [facility] [place], [name], MD   8 months ago Primary hypertension   [facility] [place], [name], MD   10 months ago Closed bilateral fracture of pubic rami with routine healing, subsequent encounter   [facility] [place], [name], MD   1 year ago Essential hypertension   [place], [name], MD   1 year ago Night sweats   [facility] [place], [name], MD      Future Appointments            In 1 month Bacigalupo, [name], MD [facility]           . spironolactone (ALDACTONE) 25  MG tablet [Pharmacy Med Name: SPIRONOLACTONE 25 MG TABLET] 90 tablet 0    Sig: TAKE 1 TABLET BY MOUTH EVERY DAY     Cardiovascular: Diuretics - Aldosterone Antagonist Failed - 10/27/2020 12:05 PM      Failed - Cr in normal range and within 360 days    Creat  Date Value Ref Range Status  03/11/2017 1.42 (H) 0.60 - 0.88 mg/dL Final    Comment:    For patients >26 years of age, the reference limit for Creatinine is approximately 13% higher for people identified as African-American. .    Creatinine, Ser  Date Value Ref Range Status  03/14/2020 1.46 (H) 0.57 - 1.00 mg/dL Final         Passed - K in normal range and within 360 days    Potassium  Date Value Ref Range Status  03/14/2020 4.6 3.5 - 5.2 mmol/L Final         Passed - Na in normal range and within 360 days    Sodium  Date Value Ref Range Status  03/14/2020 139 134 - 144 mmol/L Final         Passed - Last BP in normal range    BP Readings from Last 1 Encounters:  09/10/20 (!) 119/58         Passed - Valid encounter within last 6 months    Recent Outpatient Visits          1 month ago Encounter for annual physical exam    [facility], [name], MD   8 months ago Primary hypertension   [facility] [place], [name], MD   10 months ago Closed bilateral fracture of pubic rami with routine healing, subsequent encounter   [facility], [name], MD   1 year ago Essential hypertension   [place], [name], MD   1 year ago Night sweats   [facility] [place], [name], MD      Future Appointments            In 1 month Bacigalupo, [name], MD [facility] [place]

## 2020-10-29 NOTE — Telephone Encounter (Signed)
Please review for Dr. B ° ° °Thanks,  ° °-Garison [place]  °

## 2020-11-05 DIAGNOSIS — M5136 Other intervertebral disc degeneration, lumbar region: Secondary | ICD-10-CM | POA: Diagnosis not present

## 2020-11-05 DIAGNOSIS — M6530 Trigger finger, unspecified finger: Secondary | ICD-10-CM | POA: Diagnosis not present

## 2020-11-05 DIAGNOSIS — H903 Sensorineural hearing loss, bilateral: Secondary | ICD-10-CM | POA: Diagnosis not present

## 2020-11-05 DIAGNOSIS — M5416 Radiculopathy, lumbar region: Secondary | ICD-10-CM | POA: Diagnosis not present

## 2020-11-06 ENCOUNTER — Other Ambulatory Visit: Payer: Self-pay | Admitting: Family Medicine

## 2020-11-06 NOTE — Telephone Encounter (Signed)
   Notes to clinic:  The original prescription was discontinued on 09/10/2020 by Virginia Crews, MD. Renewing this prescription may not be appropriate   Requested Prescriptions  Pending Prescriptions Disp Refills   amLODipine (NORVASC) 5 MG tablet [Pharmacy Med Name: AMLODIPINE BESYLATE 5 MG TAB] 90 tablet 1    Sig: TAKE 1 TABLET BY MOUTH EVERY DAY      Cardiovascular:  Calcium Channel Blockers Passed - 11/06/2020  1:22 AM      Passed - Last BP in normal range    BP Readings from Last 1 Encounters:  09/10/20 (!) 119/58          Passed - Valid encounter within last 6 months    Recent Outpatient Visits           1 month ago Encounter for annual physical exam   [facility] [place], [name], MD   8 months ago Primary hypertension   [facility] [place], [name], MD   11 months ago Closed bilateral fracture of pubic rami with routine healing, subsequent encounter   [facility], [name], MD   1 year ago Essential hypertension   [place], [name], MD   1 year ago Night sweats   [facility], [name], MD       Future Appointments             In 1 month Bacigalupo, [name], MD [facility] [place]

## 2020-12-13 ENCOUNTER — Ambulatory Visit: Payer: Self-pay | Admitting: Family Medicine

## 2020-12-25 ENCOUNTER — Ambulatory Visit: Payer: Self-pay | Admitting: *Deleted

## 2020-12-25 ENCOUNTER — Telehealth: Payer: Self-pay | Admitting: Family Medicine

## 2020-12-25 NOTE — Telephone Encounter (Signed)
Patient's daughter called inquiring if patient s/b taking one or two Remeron at hs? TC to the patient. Becky Prince identified mirtazapine and stated she only takes one tab at bedtime which is how it is ordered.  No further questions.  Reason for Disposition . Caller has medicine question only, adult not sick, AND triager answers question  Answer Assessment - Initial Assessment Questions 1. NAME of MEDICATION: "What medicine are you calling about?"     Mirtazapine  2. QUESTION: "What is your question?" (e.g., double dose of medicine, side effect)     How many should she be taking daily 3. PRESCRIBING HCP: "Who prescribed it?" Reason: if prescribed by specialist, call should be referred to that group.     pcp 4. SYMPTOMS: "Do you have any symptoms?"     no 5. SEVERITY: If symptoms are present, ask "Are they mild, moderate or severe?"     na 6. PREGNANCY:  "Is there any chance that you are pregnant?" "When was your last menstrual period?"     na  Protocols used: Medication Question Call-A-AH

## 2020-12-25 NOTE — Telephone Encounter (Signed)
See triage encounter on 12/25/20

## 2020-12-25 NOTE — Telephone Encounter (Signed)
Patients daughter, called states is wondering what to do. She is saying patient is taking too much medication,  of , metazopran. Shoudl be 1 a day and not 2 she is taking. She is asking what to do to cut back or should she.  [place] wants to know can she be emailed at  [email or web address] or can call patient back at home number.

## 2021-01-13 DIAGNOSIS — M48062 Spinal stenosis, lumbar region with neurogenic claudication: Secondary | ICD-10-CM | POA: Diagnosis not present

## 2021-01-13 DIAGNOSIS — M5416 Radiculopathy, lumbar region: Secondary | ICD-10-CM | POA: Diagnosis not present

## 2021-01-14 ENCOUNTER — Other Ambulatory Visit: Payer: Self-pay | Admitting: Family Medicine

## 2021-01-20 DIAGNOSIS — M2041 Other hammer toe(s) (acquired), right foot: Secondary | ICD-10-CM | POA: Diagnosis not present

## 2021-01-20 DIAGNOSIS — B351 Tinea unguium: Secondary | ICD-10-CM | POA: Diagnosis not present

## 2021-01-20 DIAGNOSIS — M2012 Hallux valgus (acquired), left foot: Secondary | ICD-10-CM | POA: Diagnosis not present

## 2021-01-20 DIAGNOSIS — L600 Ingrowing nail: Secondary | ICD-10-CM | POA: Diagnosis not present

## 2021-01-20 DIAGNOSIS — M79675 Pain in left toe(s): Secondary | ICD-10-CM | POA: Diagnosis not present

## 2021-01-20 DIAGNOSIS — M79674 Pain in right toe(s): Secondary | ICD-10-CM | POA: Diagnosis not present

## 2021-01-20 DIAGNOSIS — M2042 Other hammer toe(s) (acquired), left foot: Secondary | ICD-10-CM | POA: Diagnosis not present

## 2021-01-20 DIAGNOSIS — M2011 Hallux valgus (acquired), right foot: Secondary | ICD-10-CM | POA: Diagnosis not present

## 2021-01-22 ENCOUNTER — Other Ambulatory Visit: Payer: Self-pay | Admitting: Family Medicine

## 2021-01-26 ENCOUNTER — Other Ambulatory Visit: Payer: Self-pay | Admitting: Family Medicine

## 2021-02-05 ENCOUNTER — Other Ambulatory Visit: Payer: Self-pay | Admitting: Family Medicine

## 2021-02-05 DIAGNOSIS — F4329 Adjustment disorder with other symptoms: Secondary | ICD-10-CM

## 2021-02-05 NOTE — Telephone Encounter (Signed)
Requested Prescriptions  Pending Prescriptions Disp Refills  . citalopram (CELEXA) 10 MG tablet [Pharmacy Med Name: CITALOPRAM HBR 10 MG TABLET] 90 tablet 0    Sig: TAKE 1 TABLET BY MOUTH EVERY DAY     Psychiatry:  Antidepressants - SSRI Passed - 02/05/2021  1:30 AM      Passed - Valid encounter within last 6 months    Recent Outpatient Visits          4 months ago Encounter for annual physical exam   [facility] [place], [name], MD   11 months ago Primary hypertension   [facility] [place], [name], MD   1 year ago Closed bilateral fracture of pubic rami with routine healing, subsequent encounter   [facility], [name], MD   1 year ago Essential hypertension   [place], [name], MD   1 year ago Night sweats   [facility] [place], [name], MD      Future Appointments            In 2 months Bacigalupo, [name], MD [facility], [place]

## 2021-02-11 DIAGNOSIS — M48062 Spinal stenosis, lumbar region with neurogenic claudication: Secondary | ICD-10-CM | POA: Diagnosis not present

## 2021-02-11 DIAGNOSIS — M5416 Radiculopathy, lumbar region: Secondary | ICD-10-CM | POA: Diagnosis not present

## 2021-02-11 DIAGNOSIS — M5136 Other intervertebral disc degeneration, lumbar region: Secondary | ICD-10-CM | POA: Diagnosis not present

## 2021-02-14 ENCOUNTER — Telehealth: Payer: PPO | Admitting: Family Medicine

## 2021-02-14 NOTE — Progress Notes (Deleted)
MyChart Video Visit    Virtual Visit via Video Note   This visit type was conducted due to national recommendations for restrictions regarding the COVID-19 Pandemic (e.g. social distancing) in an effort to limit this patient's exposure and mitigate transmission in our community. This patient is at least at moderate risk for complications without adequate follow up. This format is felt to be most appropriate for this patient at this time. Physical exam was limited by quality of the video and audio technology used for the visit.   Patient location: *** Provider location: ***  I discussed the limitations of evaluation and management by telemedicine and the availability of in person appointments. The patient expressed understanding and agreed to proceed.  Patient: Becky Prince   DOB: 05/19/1925   87 y.o. Female  MRN: [id number] Visit Date: 02/14/2021  Today's healthcare provider: [name], MD   No chief complaint on file.  Subjective    HPI  Hypertension, follow-up  BP Readings from Last 3 Encounters:  09/10/20 (!) 119/58  10/06/19 136/65  08/24/19 122/66   Wt Readings from Last 3 Encounters:  09/10/20 129 lb 11.2 oz (58.8 kg)  10/03/19 132 lb (59.9 kg)  08/24/19 130 lb (59 kg)     She was last seen for hypertension 5 months ago.  BP at that visit was 119/58. Management since that visit includes stop amlodipine.  She reports excellent compliance with treatment. She is not having side effects.  She is following a Regular diet. She is not exercising. She does not smoke.  Use of agents associated with hypertension: none.   Outside blood pressures are {***enter patient reported home BP readings, or 'not being checked':1}. Symptoms: {Yes/No:20286} chest pain {Yes/No:20286} chest pressure  {Yes/No:20286} palpitations {Yes/No:20286} syncope  {Yes/No:20286} dyspnea {Yes/No:20286} orthopnea  {Yes/No:20286} paroxysmal nocturnal dyspnea {Yes/No:20286} lower extremity  edema   Pertinent labs: Lab Results  Component Value Date   CHOL 150 09/10/2020   HDL 59 09/10/2020   LDLCALC 73 09/10/2020   TRIG 95 09/10/2020   CHOLHDL 2.5 09/10/2020   Lab Results  Component Value Date   NA 139 03/14/2020   K 4.6 03/14/2020   CREATININE 1.46 (H) 03/14/2020   GFRNONAA 31 (L) 03/14/2020   GLUCOSE 103 (H) 03/14/2020     The ASCVD Risk score (Arnett DK, et al., 2019) failed to calculate for the following reasons:   The 2019 ASCVD risk score is only valid for ages 66 to 55   ---------------------------------------------------------------------------------------------------    Medications: Outpatient Medications Prior to Visit  Medication Sig   acetaminophen (TYLENOL) 500 MG tablet Take 2 tablets (1,000 mg total) by mouth 3 (three) times daily as needed for mild pain (or Fever >/= 101).   amLODipine (NORVASC) 5 MG tablet TAKE 1 TABLET BY MOUTH EVERY DAY   ASPIRIN 81 PO Take 81 mg by mouth daily at 6 (six) AM.   atorvastatin (LIPITOR) 40 MG tablet Take 1 tablet (40 mg total) by mouth daily.   citalopram (CELEXA) 10 MG tablet TAKE 1 TABLET BY MOUTH EVERY DAY   donepezil (ARICEPT) 10 MG tablet TAKE 1 TABLET BY MOUTH EVERYDAY AT BEDTIME   fluticasone (FLONASE) 50 MCG/ACT nasal spray PLACE 1 SPRAY INTO BOTH NOSTRILS DAILY.   HYDROcodone-acetaminophen (NORCO/VICODIN) 5-325 MG tablet Take 0.5 tablets by mouth 2 (two) times daily as needed.   mirtazapine (REMERON) 15 MG tablet TAKE 1 TABLET BY MOUTH EVERYDAY AT BEDTIME   spironolactone (ALDACTONE) 25 MG tablet TAKE 1 TABLET  BY MOUTH EVERY DAY   traMADol (ULTRAM) 50 MG tablet Take 50 mg by mouth every 6 (six) hours as needed.   No facility-administered medications prior to visit.    Review of Systems  {Labs  Heme  Chem  Endocrine  Serology  Results Review (optional):23779}  Objective    There were no vitals taken for this visit. {Show previous vital signs (optional):23777}  Physical Exam      Assessment & Plan     ***  No follow-ups on file.     I discussed the assessment and treatment plan with the patient. The patient was provided an opportunity to ask questions and all were answered. The patient agreed with the plan and demonstrated an understanding of the instructions.   The patient was advised to call back or seek an in-person evaluation if the symptoms worsen or if the condition fails to improve as anticipated.  I provided *** minutes of non-face-to-face time during this encounter.  {provider attestation***:1}  [name], MD [facility] [phone] (phone) [phone] (fax)  [place]

## 2021-02-25 ENCOUNTER — Other Ambulatory Visit: Payer: Self-pay

## 2021-02-25 ENCOUNTER — Ambulatory Visit (INDEPENDENT_AMBULATORY_CARE_PROVIDER_SITE_OTHER): Payer: PPO | Admitting: Family Medicine

## 2021-02-25 ENCOUNTER — Encounter: Payer: Self-pay | Admitting: Family Medicine

## 2021-02-25 VITALS — BP 120/60 | HR 83 | Temp 97.8°F | Resp 16 | Wt 126.6 lb

## 2021-02-25 DIAGNOSIS — F0280 Dementia in other diseases classified elsewhere without behavioral disturbance: Secondary | ICD-10-CM

## 2021-02-25 DIAGNOSIS — F0150 Vascular dementia without behavioral disturbance: Secondary | ICD-10-CM | POA: Diagnosis not present

## 2021-02-25 DIAGNOSIS — I10 Essential (primary) hypertension: Secondary | ICD-10-CM | POA: Diagnosis not present

## 2021-02-25 DIAGNOSIS — G309 Alzheimer's disease, unspecified: Secondary | ICD-10-CM

## 2021-02-25 DIAGNOSIS — Z23 Encounter for immunization: Secondary | ICD-10-CM

## 2021-02-25 DIAGNOSIS — N1832 Chronic kidney disease, stage 3b: Secondary | ICD-10-CM

## 2021-02-25 MED ORDER — SPIRONOLACTONE 25 MG PO TABS: 12.5000 mg | ORAL_TABLET | Freq: Every day | ORAL | 0 refills | Status: DC

## 2021-02-25 MED ORDER — DONEPEZIL HCL 10 MG PO TABS: 10.0000 mg | ORAL_TABLET | Freq: Every day | ORAL | 3 refills | Status: DC

## 2021-02-25 NOTE — Assessment & Plan Note (Signed)
BP 120/60 in office Occasional orthostatic hypotension Decrease spironolactone to 12.5mg  daily

## 2021-02-25 NOTE — Patient Instructions (Addendum)
Resume Donepezil (Aricept) 10mg  at bedtime Cut spironolactone to 12.5 mg daily (helf pill)  Let us know if you're still getting dizzy Check with the pharmacy about the COVID booster  You had your flu shot today

## 2021-02-25 NOTE — Assessment & Plan Note (Signed)
Monitored by nephrology Recheck CMP (to be done at nephrology appointment in two weeks)

## 2021-02-25 NOTE — Progress Notes (Signed)
Established patient visit   Patient: Becky Prince   DOB: [birth year]   87 y.o. Female  MRN: [id number] Visit Date: 02/25/2021  Today's healthcare provider: [name], MD   Chief Complaint  Patient presents with   Hypertension   Subjective    Has not taken donezepil for the past three months Memory issues have worsened Orthostatic hypotension once every two weeks, no falls Tolerating all medications well    Medications: Outpatient Medications Prior to Visit  Medication Sig   acetaminophen (TYLENOL) 500 MG tablet Take 2 tablets (1,000 mg total) by mouth 3 (three) times daily as needed for mild pain (or Fever >/= 101).   atorvastatin (LIPITOR) 40 MG tablet Take 1 tablet (40 mg total) by mouth daily.   citalopram (CELEXA) 10 MG tablet TAKE 1 TABLET BY MOUTH EVERY DAY   HYDROcodone-acetaminophen (NORCO/VICODIN) 5-325 MG tablet Take 0.5 tablets by mouth every 6 (six) hours as needed.   mirtazapine (REMERON) 15 MG tablet TAKE 1 TABLET BY MOUTH EVERYDAY AT BEDTIME   traMADol (ULTRAM) 50 MG tablet Take 50 mg by mouth every 6 (six) hours as needed.   [DISCONTINUED] spironolactone (ALDACTONE) 25 MG tablet TAKE 1 TABLET BY MOUTH EVERY DAY   [DISCONTINUED] amLODipine (NORVASC) 5 MG tablet TAKE 1 TABLET BY MOUTH EVERY DAY (Patient not taking: No sig reported)   [DISCONTINUED] ASPIRIN 81 PO Take 81 mg by mouth daily at 6 (six) AM. (Patient not taking: No sig reported)   [DISCONTINUED] donepezil (ARICEPT) 10 MG tablet TAKE 1 TABLET BY MOUTH EVERYDAY AT BEDTIME (Patient not taking: Reported on 02/25/2021)   [DISCONTINUED] fluticasone (FLONASE) 50 MCG/ACT nasal spray PLACE 1 SPRAY INTO BOTH NOSTRILS DAILY. (Patient not taking: No sig reported)   No facility-administered medications prior to visit.    Review of Systems  Constitutional: Negative.   HENT: Negative.    Eyes: Negative.   Respiratory: Negative.    Cardiovascular: Negative.  Negative for leg swelling.   Gastrointestinal:  Positive for diarrhea.  Endocrine: Negative.   Genitourinary: Negative.   Musculoskeletal: Negative.   Skin: Negative.   Neurological:  Positive for light-headedness.  Hematological: Negative.   Psychiatric/Behavioral: Negative.    All other systems reviewed and are negative.     Objective    BP 120/60 (BP Location: Left Arm, Patient Position: Sitting, Cuff Size: Normal)   Pulse 83   Temp 97.8 F (36.6 C) (Temporal)   Resp 16   Wt 126 lb 9.6 oz (57.4 kg)   BMI 19.25 kg/m  {Show previous vital signs (optional):23777}  Physical Exam Vitals reviewed.  Constitutional:      General: She is not in acute distress.    Appearance: Normal appearance. She is not ill-appearing or toxic-appearing.  HENT:     Head: Normocephalic and atraumatic.     Right Ear: External ear normal.     Left Ear: External ear normal.     Nose: Nose normal. No congestion or rhinorrhea.     Mouth/Throat:     Mouth: Mucous membranes are moist.     Pharynx: Oropharynx is clear. No oropharyngeal exudate or posterior oropharyngeal erythema.  Eyes:     General: No scleral icterus.    Extraocular Movements: Extraocular movements intact.     Conjunctiva/sclera: Conjunctivae normal.     Pupils: Pupils are equal, round, and reactive to light.  Cardiovascular:     Rate and Rhythm: Normal rate and regular rhythm.     Pulses: Normal pulses.  Heart sounds: Normal heart sounds. No murmur heard.   No friction rub. No gallop.  Pulmonary:     Effort: Pulmonary effort is normal. No respiratory distress.     Breath sounds: Normal breath sounds. No wheezing or rhonchi.  Chest:     Chest wall: No tenderness.  Abdominal:     General: Abdomen is flat. There is no distension.     Palpations: Abdomen is soft.     Tenderness: There is no abdominal tenderness.  Musculoskeletal:        General: Normal range of motion.     Cervical back: Normal range of motion and neck supple.     Right lower leg:  No edema.     Left lower leg: No edema.  Skin:    General: Skin is warm and dry.     Findings: No lesion or rash.  Neurological:     General: No focal deficit present.     Mental Status: She is alert and oriented to person, place, and time. Mental status is at baseline.  Psychiatric:        Mood and Affect: Mood normal.     No results found for any visits on 02/25/21.  Assessment & Plan     Problem List Items Addressed This Visit       Cardiovascular and Mediastinum   BP (high blood pressure)    BP 120/60 in office Occasional orthostatic hypotension Decrease spironolactone to 12.5mg  daily      Relevant Medications   spironolactone (ALDACTONE) 25 MG tablet   Mixed dementia (HCC) - Primary    Chronic, worsening Has not taken donepezil for the past three months Continue donepezil 10mg       Relevant Medications   donepezil (ARICEPT) 10 MG tablet   spironolactone (ALDACTONE) 25 MG tablet     Genitourinary   Stage 3b chronic kidney disease ([place])    Monitored by nephrology Recheck CMP (to be done at nephrology appointment in two weeks)      Other Visit Diagnoses     Need for influenza vaccination       Relevant Orders   Flu Vaccine QUAD High Dose(Fluad) (Completed)        Return in about 6 months (around 08/25/2021) for CPE, AWV.      Nelva Nay, Medical Student 02/25/2021, 12:21 PM   Patient seen along with MS3 student Nelva Nay. I personally evaluated this patient along with the student, and verified all aspects of the history, physical exam, and medical decision making as documented by the student. I agree with the student's documentation and have made all necessary edits.  Rainy Rothman, [name], MD, MPH [facility]

## 2021-02-25 NOTE — Assessment & Plan Note (Signed)
Chronic, worsening Has not taken donepezil for the past three months Continue donepezil 10mg 

## 2021-03-10 DIAGNOSIS — N1832 Chronic kidney disease, stage 3b: Secondary | ICD-10-CM | POA: Diagnosis not present

## 2021-03-10 DIAGNOSIS — I10 Essential (primary) hypertension: Secondary | ICD-10-CM | POA: Diagnosis not present

## 2021-03-10 LAB — CBC: RBC: 4.8 (ref 3.87–5.11)

## 2021-03-10 LAB — COMPREHENSIVE METABOLIC PANEL
Albumin: 4.3 (ref 3.5–5.0)
Calcium: 9.9 (ref 8.7–10.7)
GFR calc non Af Amer: 34

## 2021-03-10 LAB — CBC AND DIFFERENTIAL
HCT: 45 (ref 36–46)
Hemoglobin: 14.7 (ref 12.0–16.0)
Platelets: 285 (ref 150–399)
WBC: 5.8

## 2021-03-10 LAB — BASIC METABOLIC PANEL
BUN: 20 (ref 4–21)
CO2: 30 — AB (ref 13–22)
Chloride: 99 (ref 99–108)
Creatinine: 1.5 — AB (ref 0.5–1.1)
Glucose: 79
Potassium: 4.8 (ref 3.4–5.3)
Sodium: 137 (ref 137–147)

## 2021-04-07 ENCOUNTER — Ambulatory Visit: Payer: PPO | Admitting: Family Medicine

## 2021-04-07 DIAGNOSIS — M1611 Unilateral primary osteoarthritis, right hip: Secondary | ICD-10-CM | POA: Diagnosis not present

## 2021-04-07 DIAGNOSIS — F0390 Unspecified dementia without behavioral disturbance: Secondary | ICD-10-CM | POA: Diagnosis not present

## 2021-04-07 DIAGNOSIS — M479 Spondylosis, unspecified: Secondary | ICD-10-CM | POA: Diagnosis not present

## 2021-04-07 DIAGNOSIS — I10 Essential (primary) hypertension: Secondary | ICD-10-CM | POA: Diagnosis not present

## 2021-04-15 DIAGNOSIS — M5416 Radiculopathy, lumbar region: Secondary | ICD-10-CM | POA: Diagnosis not present

## 2021-04-15 DIAGNOSIS — M48062 Spinal stenosis, lumbar region with neurogenic claudication: Secondary | ICD-10-CM | POA: Diagnosis not present

## 2021-04-25 ENCOUNTER — Other Ambulatory Visit: Payer: Self-pay | Admitting: Family Medicine

## 2021-04-25 DIAGNOSIS — H903 Sensorineural hearing loss, bilateral: Secondary | ICD-10-CM | POA: Diagnosis not present

## 2021-04-25 DIAGNOSIS — F4329 Adjustment disorder with other symptoms: Secondary | ICD-10-CM

## 2021-04-25 NOTE — Telephone Encounter (Signed)
Requested medication (s) are due for refill today: yes  Requested medication (s) are on the active medication list: yes Last refill:  01/26/21  Future visit scheduled: 09/16/21  Notes to clinic:  Failed protocol of labs within 360 days, labs are from 09/2019, has upcoming appt, please assess.    Requested Prescriptions  Pending Prescriptions Disp Refills   mirtazapine (REMERON) 15 MG tablet [Pharmacy Med Name: MIRTAZAPINE 15 MG TABLET] 90 tablet 0    Sig: TAKE 1 TABLET BY MOUTH EVERYDAY AT BEDTIME     Psychiatry: Antidepressants - mirtazapine Failed - 04/25/2021  6:47 PM      Failed - AST in normal range and within 360 days    AST  Date Value Ref Range Status  10/03/2019 24 15 - 41 U/L Final          Failed - ALT in normal range and within 360 days    ALT  Date Value Ref Range Status  10/03/2019 18 0 - 44 U/L Final          Passed - Triglycerides in normal range and within 360 days    Triglycerides  Date Value Ref Range Status  09/10/2020 95 0 - 149 mg/dL Final          Passed - Total Cholesterol in normal range and within 360 days    Cholesterol, Total  Date Value Ref Range Status  09/10/2020 150 100 - 199 mg/dL Final          Passed - WBC in normal range and within 360 days    WBC  Date Value Ref Range Status  09/10/2020 7.1 3.4 - 10.8 x10E3/uL Final  10/06/2019 7.4 4.0 - 10.5 K/uL Final          Passed - Valid encounter within last 6 months    Recent Outpatient Visits           1 month ago Mixed dementia [facility])   [place], [name], MD   7 months ago Encounter for annual physical exam   [facility], [name], MD   1 year ago Primary hypertension   [place], [name], MD   1 year ago Closed bilateral fracture of pubic rami with routine healing, subsequent encounter   [facility], [name], MD   1 year ago Essential hypertension   [place], [name], MD              Signed Prescriptions Disp Refills   citalopram (CELEXA) 10 MG tablet 90 tablet 1    Sig: TAKE 1 TABLET BY MOUTH EVERY DAY     Psychiatry:  Antidepressants - SSRI Passed - 04/25/2021  6:47 PM      Passed - Valid encounter within last 6 months    Recent Outpatient Visits           1 month ago Mixed dementia [facility])   [facility] [place], [name], MD   7 months ago Encounter for annual physical exam   [facility] [place], [name], MD   1 year ago Primary hypertension   [facility] [place], [name], MD   1 year ago Closed bilateral fracture of pubic rami with routine healing, subsequent encounter   [facility], [name], MD   1 year ago Essential hypertension   [facility] [place], [name], MD

## 2021-04-30 ENCOUNTER — Other Ambulatory Visit: Payer: Self-pay | Admitting: Family Medicine

## 2021-05-01 NOTE — Telephone Encounter (Signed)
Requested medications are due for refill today.  yes  Requested medications are on the active medications list.  yes  Last refill. 02/25/2021  Future visit scheduled.   yes  Notes to clinic.  Failed protocol d/t expired labs.    Requested Prescriptions  Pending Prescriptions Disp Refills   spironolactone (ALDACTONE) 25 MG tablet [Pharmacy Med Name: SPIRONOLACTONE 25 MG TABLET] 90 tablet     Sig: TAKE 1 TABLET BY MOUTH EVERY DAY     Cardiovascular: Diuretics - Aldosterone Antagonist Failed - 04/30/2021 10:52 AM      Failed - Cr in normal range and within 360 days    Creat  Date Value Ref Range Status  03/11/2017 1.42 (H) 0.60 - 0.88 mg/dL Final    Comment:    For patients >39 years of age, the reference limit for Creatinine is approximately 13% higher for people identified as African-American. .    Creatinine, Ser  Date Value Ref Range Status  03/14/2020 1.46 (H) 0.57 - 1.00 mg/dL Final          Failed - K in normal range and within 360 days    Potassium  Date Value Ref Range Status  03/14/2020 4.6 3.5 - 5.2 mmol/L Final          Failed - Na in normal range and within 360 days    Sodium  Date Value Ref Range Status  03/14/2020 139 134 - 144 mmol/L Final          Passed - Last BP in normal range    BP Readings from Last 1 Encounters:  02/25/21 120/60          Passed - Valid encounter within last 6 months    Recent Outpatient Visits           2 months ago Mixed dementia [facility])   [facility] [place], [name], MD   7 months ago Encounter for annual physical exam   [facility], [name], MD   1 year ago Primary hypertension   [facility] [place], [name], MD   1 year ago Closed bilateral fracture of pubic rami with routine healing, subsequent encounter   [facility], [name], MD   1 year ago Essential hypertension   [facility] [place], [name], MD

## 2021-05-13 DIAGNOSIS — M5136 Other intervertebral disc degeneration, lumbar region: Secondary | ICD-10-CM | POA: Diagnosis not present

## 2021-05-13 DIAGNOSIS — Z79899 Other long term (current) drug therapy: Secondary | ICD-10-CM | POA: Diagnosis not present

## 2021-05-13 DIAGNOSIS — M5416 Radiculopathy, lumbar region: Secondary | ICD-10-CM | POA: Diagnosis not present

## 2021-05-13 DIAGNOSIS — M48062 Spinal stenosis, lumbar region with neurogenic claudication: Secondary | ICD-10-CM | POA: Diagnosis not present

## 2021-06-02 DIAGNOSIS — F0390 Unspecified dementia without behavioral disturbance: Secondary | ICD-10-CM | POA: Diagnosis not present

## 2021-06-02 DIAGNOSIS — N184 Chronic kidney disease, stage 4 (severe): Secondary | ICD-10-CM | POA: Diagnosis not present

## 2021-06-02 DIAGNOSIS — F339 Major depressive disorder, recurrent, unspecified: Secondary | ICD-10-CM | POA: Diagnosis not present

## 2021-06-02 DIAGNOSIS — I509 Heart failure, unspecified: Secondary | ICD-10-CM | POA: Diagnosis not present

## 2021-07-01 ENCOUNTER — Other Ambulatory Visit: Payer: Self-pay | Admitting: Family Medicine

## 2021-07-01 NOTE — Telephone Encounter (Signed)
Requested Prescriptions  ?Pending Prescriptions Disp Refills  ?? atorvastatin (LIPITOR) 40 MG tablet [Pharmacy Med Name: ATORVASTATIN 40 MG TABLET] 90 tablet 0  ?  Sig: TAKE 1 TABLET BY MOUTH EVERY DAY  ?  ? Cardiovascular:  Antilipid - Statins Failed - 07/01/2021  7:40 AM  ?  ?  Failed - Lipid Panel in normal range within the last 12 months  ?  Cholesterol, Total  ?Date Value Ref Range Status  ?09/10/2020 150 100 - 199 mg/dL Final  ? ?LDL Chol Calc (NIH)  ?Date Value Ref Range Status  ?09/10/2020 73 0 - 99 mg/dL Final  ? ?HDL  ?Date Value Ref Range Status  ?09/10/2020 59 >39 mg/dL Final  ? ?Triglycerides  ?Date Value Ref Range Status  ?09/10/2020 95 0 - 149 mg/dL Final  ? ?  ?  ?  Passed - Patient is not pregnant  ?  ?  Passed - Valid encounter within last 12 months  ?  Recent Outpatient Visits   ?      ? 4 months ago Mixed dementia ([place])  ? [facility], [name], MD  ? 9 months ago Encounter for annual physical exam  ? [facility], [name], MD  ? 1 year ago Primary hypertension  ? [facility], [name], MD  ? 1 year ago Closed bilateral fracture of pubic rami with routine healing, subsequent encounter  ? [facility], [name], MD  ? 1 year ago Essential hypertension  ? [facility], [name], MD  ?  ?  ? ?  ?  ?  ? ?

## 2021-07-16 ENCOUNTER — Telehealth: Payer: Self-pay

## 2021-07-16 NOTE — Telephone Encounter (Signed)
Patient's daughter reports she had one of her other doctors fill the form out.  ?

## 2021-07-16 NOTE — Telephone Encounter (Signed)
Copied from [place] [phone]. Topic: General - Inquiry ?>> Jul 16, 2021  8:23 AM McGill, Nelva Bush wrote: ?Reason for CRM: Pt daughter asked if she could stop by and drop off the Application for Disability Parking Placard for it to be filled out by PCP.? ?Pt daughter is unsure if pt needs to schedule an appointment or if the PCP can just fill out the application. ? ?Requesting a call back. ?

## 2021-07-24 ENCOUNTER — Other Ambulatory Visit: Payer: Self-pay | Admitting: Family Medicine

## 2021-07-29 DIAGNOSIS — M48062 Spinal stenosis, lumbar region with neurogenic claudication: Secondary | ICD-10-CM | POA: Diagnosis not present

## 2021-07-29 DIAGNOSIS — M5416 Radiculopathy, lumbar region: Secondary | ICD-10-CM | POA: Diagnosis not present

## 2021-08-11 DIAGNOSIS — M5416 Radiculopathy, lumbar region: Secondary | ICD-10-CM | POA: Diagnosis not present

## 2021-08-11 DIAGNOSIS — M48062 Spinal stenosis, lumbar region with neurogenic claudication: Secondary | ICD-10-CM | POA: Diagnosis not present

## 2021-08-11 DIAGNOSIS — M5136 Other intervertebral disc degeneration, lumbar region: Secondary | ICD-10-CM | POA: Diagnosis not present

## 2021-09-01 DIAGNOSIS — F0390 Unspecified dementia without behavioral disturbance: Secondary | ICD-10-CM | POA: Diagnosis not present

## 2021-09-01 DIAGNOSIS — M1611 Unilateral primary osteoarthritis, right hip: Secondary | ICD-10-CM | POA: Diagnosis not present

## 2021-09-01 DIAGNOSIS — I10 Essential (primary) hypertension: Secondary | ICD-10-CM | POA: Diagnosis not present

## 2021-09-01 DIAGNOSIS — M479 Spondylosis, unspecified: Secondary | ICD-10-CM | POA: Diagnosis not present

## 2021-09-10 DIAGNOSIS — N1832 Chronic kidney disease, stage 3b: Secondary | ICD-10-CM | POA: Diagnosis not present

## 2021-09-10 DIAGNOSIS — I10 Essential (primary) hypertension: Secondary | ICD-10-CM | POA: Diagnosis not present

## 2021-09-11 LAB — COMPREHENSIVE METABOLIC PANEL
Albumin: 4.1 (ref 3.5–5.0)
Calcium: 9.2 (ref 8.7–10.7)
eGFR: 34

## 2021-09-11 LAB — BASIC METABOLIC PANEL
BUN: 25 — AB (ref 4–21)
CO2: 24 — AB (ref 13–22)
Chloride: 105 (ref 99–108)
Creatinine: 1.4 — AB (ref <=1.1)
Glucose: 76
Potassium: 4.5 mEq/L (ref 3.5–5.1)
Sodium: 140 (ref 137–147)

## 2021-09-11 LAB — CBC AND DIFFERENTIAL
HCT: 42 (ref 36–46)
Hemoglobin: 14 (ref 12.0–16.0)
Platelets: 224 10*3/uL (ref 150–400)
WBC: 6.8

## 2021-09-11 LAB — CBC: RBC: 4.31 (ref 3.87–5.11)

## 2021-09-15 ENCOUNTER — Telehealth: Payer: Self-pay | Admitting: Family Medicine

## 2021-09-15 NOTE — Progress Notes (Unsigned)
I,Elliemae Braman S Laqueshia Cihlar,acting as a Education administrator for [name], MD.,have documented all relevant documentation on the behalf of [name], MD,as directed by  [name], MD while in the presence of [name], MD.   Annual Wellness Visit     Patient: Becky Prince, Female    DOB: 1925-06-12, 88 y.o.   MRN: [id number] Visit Date: 09/16/2021  Today's Provider: [name], MD   No chief complaint on file.  Subjective    Becky Prince is a 88 y.o. female who presents today for her Annual Wellness Visit. She reports consuming a {diet types:17450} diet. {Exercise:19826} She generally feels {well/fairly well/poorly:18703}. She reports sleeping {well/fairly well/poorly:18703}. She {does/does not:200015} have additional problems to discuss today.   HPI   Medications: Outpatient Medications Prior to Visit  Medication Sig   acetaminophen (TYLENOL) 500 MG tablet Take 2 tablets (1,000 mg total) by mouth 3 (three) times daily as needed for mild pain (or Fever >/= 101).   atorvastatin (LIPITOR) 40 MG tablet TAKE 1 TABLET BY MOUTH EVERY DAY   citalopram (CELEXA) 10 MG tablet TAKE 1 TABLET BY MOUTH EVERY DAY   donepezil (ARICEPT) 10 MG tablet Take 1 tablet (10 mg total) by mouth at bedtime.   HYDROcodone-acetaminophen (NORCO/VICODIN) 5-325 MG tablet Take 0.5 tablets by mouth every 6 (six) hours as needed.   mirtazapine (REMERON) 15 MG tablet TAKE 1 TABLET BY MOUTH EVERYDAY AT BEDTIME   spironolactone (ALDACTONE) 25 MG tablet TAKE 1 TABLET BY MOUTH EVERY DAY   traMADol (ULTRAM) 50 MG tablet Take 50 mg by mouth every 6 (six) hours as needed.   No facility-administered medications prior to visit.    Allergies  Allergen Reactions   Naproxen Swelling    Had lip swelling 20 yrs ago - not sure if caused by oxycodone or naproxen   Oxycodone-Aspirin Swelling    Had lip swelling 20 yrs ago. Not sure if caused by oxycodone or naproxen.   Prednisone Diarrhea    Patient  Care Team: Virginia Crews, MD as PCP - General (Family Medicine) [name], MD as Referring Physician (Physical Medicine and Rehabilitation) Murlean Iba, MD (Nephrology) Leandrew Koyanagi, MD as Referring Physician (Ophthalmology) Meeler, Sherren Kerns, FNP (Family Medicine)  Review of Systems  All other systems reviewed and are negative.  Last CBC Lab Results  Component Value Date   WBC 5.8 03/10/2021   HGB 14.7 03/10/2021   HCT 45 03/10/2021   MCV 94 09/10/2020   MCH 32.0 09/10/2020   RDW 11.2 (L) 09/10/2020   PLT 285 72/53/6644   Last metabolic panel Lab Results  Component Value Date   GLUCOSE 103 (H) 03/14/2020   NA 137 03/10/2021   K 4.8 03/10/2021   CL 99 03/10/2021   CO2 30 (A) 03/10/2021   BUN 20 03/10/2021   CREATININE 1.5 (A) 03/10/2021   GFRNONAA 34 03/10/2021   CALCIUM 9.9 03/10/2021   PHOS 2.4 (L) 10/04/2019   PROT 7.3 10/03/2019   ALBUMIN 4.3 03/10/2021   LABGLOB 2.5 08/24/2019   AGRATIO 1.8 08/24/2019   BILITOT 0.8 10/03/2019   ALKPHOS 119 10/03/2019   AST 24 10/03/2019   ALT 18 10/03/2019   ANIONGAP 7 10/06/2019   Last lipids Lab Results  Component Value Date   CHOL 150 09/10/2020   HDL 59 09/10/2020   LDLCALC 73 09/10/2020   TRIG 95 09/10/2020   CHOLHDL 2.5 09/10/2020   Last hemoglobin A1c Lab Results  Component Value Date   HGBA1C  5.1 09/28/2016   Last thyroid functions Lab Results  Component Value Date   TSH 0.789 09/10/2020   T4TOTAL 7.6 03/31/2019   Last vitamin D No results found for: 25OHVITD2, 25OHVITD3, VD25OH Last vitamin B12 and Folate No results found for: VITAMINB12, FOLATE      Objective    Vitals: There were no vitals taken for this visit. BP Readings from Last 3 Encounters:  02/25/21 120/60  09/10/20 (!) 119/58  10/06/19 136/65   Wt Readings from Last 3 Encounters:  02/25/21 126 lb 9.6 oz (57.4 kg)  09/10/20 129 lb 11.2 oz (58.8 kg)  10/03/19 132 lb (59.9 kg)       Physical  Exam ***  Most recent functional status assessment:    02/25/2021   11:02 AM  In your present state of health, do you have any difficulty performing the following activities:  Hearing? 1  Vision? 0  Difficulty concentrating or making decisions? 1  Walking or climbing stairs? 1  Dressing or bathing? 1  Doing errands, shopping? 1   Most recent fall risk assessment:    02/25/2021   11:02 AM  Fall Risk   Falls in the past year? 0  Number falls in past yr: 0  Injury with Fall? 0  Risk for fall due to : No Fall Risks;Impaired balance/gait  Follow up Falls evaluation completed    Most recent depression screenings:    02/25/2021   11:02 AM 09/10/2020    2:46 PM  PHQ 2/9 Scores  PHQ - 2 Score 2   PHQ- 9 Score 5   Exception Documentation  Patient refusal   Most recent cognitive screening:     View : No data to display.         Most recent Audit-C alcohol use screening    02/25/2021   11:02 AM  Alcohol Use Disorder Test (AUDIT)  1. How often do you have a drink containing alcohol? 0  2. How many drinks containing alcohol do you have on a typical day when you are drinking? 0  3. How often do you have six or more drinks on one occasion? 0  AUDIT-C Score 0   A score of 3 or more in women, and 4 or more in men indicates increased risk for alcohol abuse, EXCEPT if all of the points are from question 1   No results found for any visits on 09/16/21.  Assessment & Plan     Annual wellness visit done today including the all of the following: Reviewed patient's Family Medical History Reviewed and updated list of patient's medical providers Assessment of cognitive impairment was done Assessed patient's functional ability Established a written schedule for health screening services Health Risk Assessent Completed and Reviewed  Exercise Activities and Dietary recommendations  Goals      Increase water intake     Recommend increasing water intake to 6-8 8 oz glasses a day.          Immunization History  Administered Date(s) Administered   Fluad Quad(high Dose 65+) 02/25/2021   Influenza, High Dose Seasonal PF 02/09/2015, 03/06/2016, 01/18/2017, 03/17/2018, 12/27/2018, 02/19/2020   PFIZER Comirnaty(Gray Top)Covid-19 Tri-Sucrose Vaccine 05/16/2019, 06/07/2019, 09/26/2020   PFIZER(Purple Top)SARS-COV-2 Vaccination 05/16/2019, 06/07/2019, 01/26/2020   Pneumococcal Conjugate-13 08/29/2014   Pneumococcal Polysaccharide-23 03/06/2016   Td 03/26/2003, 01/10/2011   Tdap 01/10/2011   Zoster, Live 10/14/2006    Health Maintenance  Topic Date Due   Zoster Vaccines- Shingrix (1 of 2) Never done   DEXA  SCAN  10/07/2017   TETANUS/TDAP  01/09/2021   INFLUENZA VACCINE  11/25/2021   Pneumonia Vaccine 60+ Years old  Completed   COVID-19 Vaccine  Completed   HPV VACCINES  Aged Out     Discussed health benefits of physical activity, and encouraged her to engage in regular exercise appropriate for her age and condition.    ***  No follow-ups on file.     {provider attestation***:1}   [name], MD  [facility] [phone] (phone) [phone] (fax)  [place]

## 2021-09-15 NOTE — Telephone Encounter (Signed)
Pts Wilmer Floor daughter is calling to see if AWV can be changed.   Pt has had labs with Dr. Ward Chatters and Pts daughter is requesting if labs can be viewed from Dr. Ward Chatters.   [phone]

## 2021-09-16 ENCOUNTER — Encounter: Payer: PPO | Admitting: Family Medicine

## 2021-09-16 DIAGNOSIS — E0590 Thyrotoxicosis, unspecified without thyrotoxic crisis or storm: Secondary | ICD-10-CM

## 2021-09-16 DIAGNOSIS — E7800 Pure hypercholesterolemia, unspecified: Secondary | ICD-10-CM

## 2021-09-16 DIAGNOSIS — I10 Essential (primary) hypertension: Secondary | ICD-10-CM

## 2021-09-16 DIAGNOSIS — Z0000 Encounter for general adult medical examination without abnormal findings: Secondary | ICD-10-CM

## 2021-09-26 NOTE — Progress Notes (Signed)
Annual Wellness Visit  I,Becky Prince,acting as a scribe for Becky Paganini, MD.,have documented all relevant documentation on the behalf of Becky Paganini, MD,as directed by  Becky Paganini, MD while in the presence of Becky Paganini, MD.     Patient: Becky Prince, Female    DOB: 02-07-1926, 88 y.o.   MRN: [id number] Visit Date: 09/29/2021  Today's Provider: [name], MD   Chief Complaint  Patient presents with   Medicare Wellness   Subjective    Becky Prince is a 88 y.o. female who presents today for her Annual Wellness Visit. She reports consuming a general diet. The patient does not participate in regular exercise at present. Does some walking.She generally feels well. She reports sleeping well. She does have additional problems to discuss today.   HPI  Sess Dr [name] for hip and back pain. Is having some increased pain. She is having injections and on med management    Medications: Outpatient Medications Prior to Visit  Medication Sig   acetaminophen (TYLENOL) 500 MG tablet Take 2 tablets (1,000 mg total) by mouth 3 (three) times daily as needed for mild pain (or Fever >/= 101).   atorvastatin (LIPITOR) 40 MG tablet TAKE 1 TABLET BY MOUTH EVERY DAY   donepezil (ARICEPT) 10 MG tablet Take 1 tablet (10 mg total) by mouth at bedtime.   HYDROcodone-acetaminophen (NORCO/VICODIN) 5-325 MG tablet Take 0.5 tablets by mouth every 6 (six) hours as needed.   mirtazapine (REMERON) 15 MG tablet TAKE 1 TABLET BY MOUTH EVERYDAY AT BEDTIME   spironolactone (ALDACTONE) 25 MG tablet TAKE 1 TABLET BY MOUTH EVERY DAY   traMADol (ULTRAM) 50 MG tablet Take 50 mg by mouth every 6 (six) hours as needed.   [DISCONTINUED] citalopram (CELEXA) 10 MG tablet TAKE 1 TABLET BY MOUTH EVERY DAY   No facility-administered medications prior to visit.    Allergies  Allergen Reactions   Naproxen Swelling    Had lip swelling 20 yrs ago - not sure if caused by oxycodone or  naproxen   Oxycodone-Aspirin Swelling    Had lip swelling 20 yrs ago. Not sure if caused by oxycodone or naproxen.   Prednisone Diarrhea    Patient Care Team: Virginia Crews, MD as PCP - General (Family Medicine) [name], MD as Referring Physician (Physical Medicine and Rehabilitation) Murlean Iba, MD (Nephrology) Leandrew Koyanagi, MD as Referring Physician (Ophthalmology) Meeler, Sherren Kerns, FNP (Family Medicine)  Review of Systems  Respiratory:  Positive for cough.   Musculoskeletal:  Positive for gait problem.       Hip Pain   Psychiatric/Behavioral:         Depression        Objective    Vitals: BP (!) 150/69 (BP Location: Left Arm, Patient Position: Sitting, Cuff Size: Normal)   Pulse 77   Temp 98.2 F (36.8 C) (Oral)   Resp 16   Ht _0  (1.727 m)   Wt 124 lb 3.2 oz (56.3 kg)   BMI 18.88 kg/m     Physical Exam Vitals reviewed.  Constitutional:      General: She is not in acute distress.    Appearance: Normal appearance. She is well-developed. She is not diaphoretic.  HENT:     Head: Normocephalic and atraumatic.  Eyes:     General: No scleral icterus.    Conjunctiva/sclera: Conjunctivae normal.  Neck:     Thyroid: No thyromegaly.  Cardiovascular:     Rate and Rhythm:  Normal rate and regular rhythm.     Heart sounds: Murmur heard.  Pulmonary:     Effort: Pulmonary effort is normal. No respiratory distress.     Breath sounds: Normal breath sounds. No wheezing, rhonchi or rales.  Musculoskeletal:     Cervical back: Neck supple.     Right lower leg: No edema.     Left lower leg: No edema.  Lymphadenopathy:     Cervical: No cervical adenopathy.  Skin:    General: Skin is warm and dry.  Neurological:     Mental Status: She is alert and oriented to person, place, and time. Mental status is at baseline.  Psychiatric:        Mood and Affect: Mood normal.        Behavior: Behavior normal.     Most recent functional status  assessment:    02/25/2021   11:02 AM  In your present state of health, do you have any difficulty performing the following activities:  Hearing? 1  Vision? 0  Difficulty concentrating or making decisions? 1  Walking or climbing stairs? 1  Dressing or bathing? 1  Doing errands, shopping? 1   Most recent fall risk assessment:    09/29/2021    9:40 AM  [place] in the past year? 1  Number falls in past yr: 0  Injury with Fall? 0    Most recent depression screenings:    09/29/2021    9:40 AM 02/25/2021   11:02 AM  PHQ 2/9 Scores  PHQ - 2 Score 1 2  PHQ- 9 Score 3 5   Most recent cognitive screening:     View : No data to display.         Most recent Audit-C alcohol use screening    09/29/2021    9:41 AM  Alcohol Use Disorder Test (AUDIT)  1. How often do you have a drink containing alcohol? 0  2. How many drinks containing alcohol do you have on a typical day when you are drinking? 0  3. How often do you have six or more drinks on one occasion? 0  AUDIT-C Score 0   A score of 3 or more in women, and 4 or more in men indicates increased risk for alcohol abuse, EXCEPT if all of the points are from question 1   Results for orders placed or performed in visit on 09/29/21  CBC and differential  Result Value Ref Range   Hemoglobin 14.0 12.0 - 16.0   HCT 42 36 - 46   Platelets 224 150 - 400 K/uL   WBC 6.8   CBC  Result Value Ref Range   RBC 4.31 3.87 - 6.46  Basic metabolic panel  Result Value Ref Range   Glucose 76    BUN 25 (A) 4 - 21   CO2 24 (A) 13 - 22   Creatinine 1.4 (A) 0.5 - 1.1   Potassium 4.5 3.5 - 5.1 mEq/L   Sodium 140 137 - 147   Chloride 105 99 - 108  Comprehensive metabolic panel  Result Value Ref Range   eGFR 34    Calcium 9.2 8.7 - 10.7   Albumin 4.1 3.5 - 5.0    Assessment & Plan     Annual wellness visit done today including the all of the following: Reviewed patient's Family Medical History Reviewed and updated list of  patient's medical providers Assessment of cognitive impairment was done Assessed patient's functional ability Established  a written schedule for health screening services Health Risk Assessent Completed and Reviewed  Exercise Activities and Dietary recommendations  Goals      Increase water intake     Recommend increasing water intake to 6-8 8 oz glasses a day.         Immunization History  Administered Date(s) Administered   Fluad Quad(high Dose 65+) 02/25/2021   Influenza, High Dose Seasonal PF 02/09/2015, 03/06/2016, 01/18/2017, 03/17/2018, 12/27/2018, 02/19/2020   PFIZER Comirnaty(Gray Top)Covid-19 Tri-Sucrose Vaccine 05/16/2019, 06/07/2019, 09/26/2020   PFIZER(Purple Top)SARS-COV-2 Vaccination 05/16/2019, 06/07/2019, 01/26/2020   Pneumococcal Conjugate-13 08/29/2014   Pneumococcal Polysaccharide-23 03/06/2016   Td 03/26/2003, 01/10/2011   Tdap 01/10/2011   Zoster, Live 10/14/2006    Health Maintenance  Topic Date Due   Zoster Vaccines- Shingrix (1 of 2) Never done   TETANUS/TDAP  01/09/2021   INFLUENZA VACCINE  11/25/2021   Pneumonia Vaccine 70+ Years old  Completed   DEXA SCAN  Completed   COVID-19 Vaccine  Completed   HPV VACCINES  Aged Out     Discussed health benefits of physical activity, and encouraged her to engage in regular exercise appropriate for her age and condition.    Problem List Items Addressed This Visit       Cardiovascular and Mediastinum   BP (high blood pressure)    Chronic and fairly well controlled Higher goal blood pressure given advanced age and comorbidities and fall risk She was having orthostatic hypotension at higher doses of her medication We will continue spironolactone at current dose       Mixed dementia (HCC)    Chronic and worsening Continue donepezil 10 mg daily Add Namenda 5 mg twice daily Discussed possible GI side effects       Relevant Medications   memantine (NAMENDA) 5 MG tablet   citalopram (CELEXA) 20  MG tablet     Endocrine   Subclinical hyperthyroidism    Not on medication and asymptomatic Recheck TSH and free T4       Relevant Orders   TSH + free T4     Other   Hypercholesteremia    Previously well controlled Continue statin Recheck FLP Reviewed CMP Goal LDL less than 100       Relevant Orders   Lipid panel   MDD (major depressive disorder)    Chronic and uncontrolled She reports some worsening of her depression symptoms that occur at least weekly She is doing well on her current medications and her appetite and weight loss have improved with the Remeron which we will continue at current dose We will increase her Celexa to 20 mg daily At follow-up, we will check in on how this is doing and consider any further dose titration as needed       Relevant Medications   citalopram (CELEXA) 20 MG tablet   RESOLVED: Adjustment disorder   Other Visit Diagnoses     Encounter for annual wellness visit (AWV) in Medicare patient    -  Primary   Encounter for annual physical exam            Return in about 6 months (around 03/31/2022) for chronic disease f/u.     I, Becky Paganini, MD, have reviewed all documentation for this visit. The documentation on 09/29/21 for the exam, diagnosis, procedures, and orders are all accurate and complete.   Charliee Krenz, [name], MD, MPH [facility]

## 2021-09-27 ENCOUNTER — Other Ambulatory Visit: Payer: Self-pay | Admitting: Family Medicine

## 2021-09-29 ENCOUNTER — Encounter: Payer: Self-pay | Admitting: Family Medicine

## 2021-09-29 ENCOUNTER — Ambulatory Visit (INDEPENDENT_AMBULATORY_CARE_PROVIDER_SITE_OTHER): Payer: PPO | Admitting: Family Medicine

## 2021-09-29 VITALS — BP 150/69 | HR 77 | Temp 98.2°F | Resp 16 | Ht 68.0 in | Wt 124.2 lb

## 2021-09-29 DIAGNOSIS — I10 Essential (primary) hypertension: Secondary | ICD-10-CM

## 2021-09-29 DIAGNOSIS — F331 Major depressive disorder, recurrent, moderate: Secondary | ICD-10-CM

## 2021-09-29 DIAGNOSIS — E7800 Pure hypercholesterolemia, unspecified: Secondary | ICD-10-CM

## 2021-09-29 DIAGNOSIS — G309 Alzheimer's disease, unspecified: Secondary | ICD-10-CM

## 2021-09-29 DIAGNOSIS — Z0000 Encounter for general adult medical examination without abnormal findings: Secondary | ICD-10-CM

## 2021-09-29 DIAGNOSIS — F0280 Dementia in other diseases classified elsewhere without behavioral disturbance: Secondary | ICD-10-CM | POA: Diagnosis not present

## 2021-09-29 DIAGNOSIS — F4329 Adjustment disorder with other symptoms: Secondary | ICD-10-CM | POA: Diagnosis not present

## 2021-09-29 DIAGNOSIS — F0150 Vascular dementia without behavioral disturbance: Secondary | ICD-10-CM

## 2021-09-29 DIAGNOSIS — E0590 Thyrotoxicosis, unspecified without thyrotoxic crisis or storm: Secondary | ICD-10-CM

## 2021-09-29 DIAGNOSIS — F329 Major depressive disorder, single episode, unspecified: Secondary | ICD-10-CM | POA: Insufficient documentation

## 2021-09-29 MED ORDER — CITALOPRAM HYDROBROMIDE 20 MG PO TABS: 20.0000 mg | ORAL_TABLET | Freq: Every day | ORAL | 1 refills | Status: DC

## 2021-09-29 MED ORDER — MEMANTINE HCL 5 MG PO TABS: 5.0000 mg | ORAL_TABLET | Freq: Two times a day (BID) | ORAL | 3 refills | Status: DC

## 2021-09-29 NOTE — Assessment & Plan Note (Signed)
Chronic and worsening Continue donepezil 10 mg daily Add Namenda 5 mg twice daily Discussed possible GI side effects

## 2021-09-29 NOTE — Assessment & Plan Note (Signed)
Previously well controlled Continue statin Recheck FLP Reviewed CMP Goal LDL less than 100

## 2021-09-29 NOTE — Assessment & Plan Note (Signed)
Not on medication and asymptomatic Recheck TSH and free T4

## 2021-09-29 NOTE — Assessment & Plan Note (Signed)
Chronic and uncontrolled She reports some worsening of her depression symptoms that occur at least weekly She is doing well on her current medications and her appetite and weight loss have improved with the Remeron which we will continue at current dose We will increase her Celexa to 20 mg daily At follow-up, we will check in on how this is doing and consider any further dose titration as needed

## 2021-09-29 NOTE — Patient Instructions (Signed)
Consider asking at the pharmacy about the tetanus shot (TDAP) and the shingles shot (Shingrix)

## 2021-09-29 NOTE — Assessment & Plan Note (Signed)
Chronic and fairly well controlled Higher goal blood pressure given advanced age and comorbidities and fall risk She was having orthostatic hypotension at higher doses of her medication We will continue spironolactone at current dose

## 2021-09-30 ENCOUNTER — Telehealth: Payer: Self-pay

## 2021-09-30 LAB — TSH+FREE T4
Free T4: 1.28 ng/dL (ref 0.82–1.77)
TSH: 0.915 u[IU]/mL (ref 0.450–4.500)

## 2021-09-30 LAB — LIPID PANEL
Chol/HDL Ratio: 2.4 ratio (ref 0.0–4.4)
Cholesterol, Total: 146 mg/dL (ref 100–199)
HDL: 60 mg/dL (ref >39)
LDL Chol Calc (NIH): 66 mg/dL (ref 0–99)
Triglycerides: 113 mg/dL (ref 0–149)
VLDL Cholesterol Cal: 20 mg/dL (ref 5–40)

## 2021-09-30 NOTE — Telephone Encounter (Signed)
  Correct. She is on both now.

## 2021-09-30 NOTE — Telephone Encounter (Signed)
Copied from [place] [id number]. Topic: General - Other >> Sep 30, 2021  9:18 AM Tessa Lerner A wrote: Reason for CRM: The patient's daughter would like confirmation from the patient's PCP that the patient is supposed to continue taking donepezil (ARICEPT) 10 MG tablet [[id number]]  and their newly prescribed memantine (NAMENDA) 5 MG tablet [[id number]]   Please contact the patient's daughter further when available

## 2021-09-30 NOTE — Telephone Encounter (Signed)
Advised 

## 2021-10-18 ENCOUNTER — Other Ambulatory Visit: Payer: Self-pay | Admitting: Family Medicine

## 2021-10-18 DIAGNOSIS — F4329 Adjustment disorder with other symptoms: Secondary | ICD-10-CM

## 2021-10-19 ENCOUNTER — Other Ambulatory Visit: Payer: Self-pay | Admitting: Family Medicine

## 2021-10-20 ENCOUNTER — Ambulatory Visit: Payer: Self-pay

## 2021-10-20 NOTE — Telephone Encounter (Signed)
Spoke with pt's daughter Becky Prince. She said that pt is longer taking the Namenda and wanting to just let Dr. B know so no more refills get sent in. She is doing well on the Citalopram and Donepezil. No further assistance needed.   Summary: daughte, Becky Prince states mother is stopping med no longer needs/welcome to touch base   memantine (NAMENDA) 5 MG tablet 60 tablet 3 09/29/2021  Sig - Route: Take 1 tablet (5 mg total) by mouth 2 (two) times daily. - Oral  Sent to pharmacy as: memantine (NAMENDA) 5 MG tablet  E-Prescribing Status: Receipt confirmed by pharmacy (09/29/2021 Pt daughter, Becky Prince has called mother is not going to take this med anymore due to side effect, wants document as such.Dr B had increase another med and she is doing fine without this one. Daughter Contact #  [phone]      Reason for Disposition  Caller has medicine question only, adult not sick, AND triager answers question  Answer Assessment - Initial Assessment Questions 1. NAME of MEDICATION: "What medicine are you calling about?"     Namenda 2. QUESTION: "What is your question?" (e.g., double dose of medicine, side effect)     Pt no longer taking 3. PRESCRIBING HCP: "Who prescribed it?" Reason: if prescribed by specialist, call should be referred to that group.     Dr Beryle Flock  Protocols used: Medication Question Call-A-AH

## 2021-10-21 ENCOUNTER — Other Ambulatory Visit: Payer: Self-pay

## 2021-10-21 NOTE — Telephone Encounter (Signed)
Namenda removed from med list.

## 2021-11-04 DIAGNOSIS — M5416 Radiculopathy, lumbar region: Secondary | ICD-10-CM | POA: Diagnosis not present

## 2021-11-04 DIAGNOSIS — M48062 Spinal stenosis, lumbar region with neurogenic claudication: Secondary | ICD-10-CM | POA: Diagnosis not present

## 2021-11-24 ENCOUNTER — Other Ambulatory Visit: Payer: Self-pay | Admitting: Family Medicine

## 2021-11-24 DIAGNOSIS — M48062 Spinal stenosis, lumbar region with neurogenic claudication: Secondary | ICD-10-CM | POA: Diagnosis not present

## 2021-11-24 DIAGNOSIS — M5416 Radiculopathy, lumbar region: Secondary | ICD-10-CM | POA: Diagnosis not present

## 2021-11-24 DIAGNOSIS — F4329 Adjustment disorder with other symptoms: Secondary | ICD-10-CM

## 2021-11-24 DIAGNOSIS — Z79899 Other long term (current) drug therapy: Secondary | ICD-10-CM | POA: Diagnosis not present

## 2021-11-24 DIAGNOSIS — M5136 Other intervertebral disc degeneration, lumbar region: Secondary | ICD-10-CM | POA: Diagnosis not present

## 2021-11-25 NOTE — Telephone Encounter (Signed)
Medication refilled 09/29/2021 #90  1 refill - confirmed by same pharmacy. Requested Prescriptions  Pending Prescriptions Disp Refills  . citalopram (CELEXA) 10 MG tablet [Pharmacy Med Name: CITALOPRAM HBR 10 MG TABLET] 90 tablet 1    Sig: TAKE 1 TABLET BY MOUTH EVERY DAY     Psychiatry:  Antidepressants - SSRI Passed - 11/24/2021  2:49 PM      Passed - Completed PHQ-2 or PHQ-9 in the last 360 days      Passed - Valid encounter within last 6 months    Recent Outpatient Visits          1 month ago Encounter for annual wellness visit (AWV) in Medicare patient   [facility], [name], MD   9 months ago Mixed dementia [facility])   [facility] [place], [name], MD   1 year ago Encounter for annual physical exam   [facility] [place], [name], MD   1 year ago Primary hypertension   [facility], [name], MD   1 year ago Closed bilateral fracture of pubic rami with routine healing, subsequent encounter   [facility] Bacigal[facility], [name], MD      Future Appointments            In 4 months Bacigalupo, [name], MD [facility] [place]

## 2021-12-17 ENCOUNTER — Emergency Department
Admission: EM | Admit: 2021-12-17 | Discharge: 2021-12-17 | Disposition: A | Discharge status: Home / Self Care | Payer: PPO | Attending: Emergency Medicine | Admitting: Emergency Medicine

## 2021-12-17 ENCOUNTER — Encounter: Payer: Self-pay | Admitting: Medical Oncology

## 2021-12-17 ENCOUNTER — Emergency Department: Payer: PPO

## 2021-12-17 DIAGNOSIS — W19XXXA Unspecified fall, initial encounter: Secondary | ICD-10-CM

## 2021-12-17 DIAGNOSIS — M47816 Spondylosis without myelopathy or radiculopathy, lumbar region: Secondary | ICD-10-CM | POA: Insufficient documentation

## 2021-12-17 DIAGNOSIS — I700 Atherosclerosis of aorta: Secondary | ICD-10-CM | POA: Diagnosis not present

## 2021-12-17 DIAGNOSIS — I10 Essential (primary) hypertension: Secondary | ICD-10-CM | POA: Insufficient documentation

## 2021-12-17 DIAGNOSIS — M48061 Spinal stenosis, lumbar region without neurogenic claudication: Secondary | ICD-10-CM | POA: Insufficient documentation

## 2021-12-17 DIAGNOSIS — K5900 Constipation, unspecified: Secondary | ICD-10-CM | POA: Diagnosis not present

## 2021-12-17 DIAGNOSIS — S32010A Wedge compression fracture of first lumbar vertebra, initial encounter for closed fracture: Secondary | ICD-10-CM | POA: Insufficient documentation

## 2021-12-17 DIAGNOSIS — S29002A Unspecified injury of muscle and tendon of back wall of thorax, initial encounter: Secondary | ICD-10-CM | POA: Diagnosis present

## 2021-12-17 DIAGNOSIS — W1839XA Other fall on same level, initial encounter: Secondary | ICD-10-CM | POA: Diagnosis not present

## 2021-12-17 DIAGNOSIS — R1032 Left lower quadrant pain: Secondary | ICD-10-CM | POA: Diagnosis not present

## 2021-12-17 DIAGNOSIS — Y92009 Unspecified place in unspecified non-institutional (private) residence as the place of occurrence of the external cause: Secondary | ICD-10-CM | POA: Insufficient documentation

## 2021-12-17 DIAGNOSIS — M4312 Spondylolisthesis, cervical region: Secondary | ICD-10-CM | POA: Diagnosis not present

## 2021-12-17 DIAGNOSIS — S22060A Wedge compression fracture of T7-T8 vertebra, initial encounter for closed fracture: Secondary | ICD-10-CM | POA: Diagnosis not present

## 2021-12-17 DIAGNOSIS — Z043 Encounter for examination and observation following other accident: Secondary | ICD-10-CM | POA: Diagnosis not present

## 2021-12-17 LAB — CBC WITH DIFFERENTIAL/PLATELET
Abs Immature Granulocytes: 0.03 10*3/uL (ref 0.00–0.07)
Basophils Absolute: 0.1 10*3/uL (ref 0.0–0.1)
Basophils Relative: 1 %
Eosinophils Absolute: 0.1 10*3/uL (ref 0.0–0.5)
Eosinophils Relative: 2 %
HCT: 42.5 % (ref 36.0–46.0)
Hemoglobin: 13.6 g/dL (ref 12.0–15.0)
Immature Granulocytes: 0 %
Lymphocytes Relative: 27 %
Lymphs Abs: 1.9 10*3/uL (ref 0.7–4.0)
MCH: 30.6 pg (ref 26.0–34.0)
MCHC: 32 g/dL (ref 30.0–36.0)
MCV: 95.5 fL (ref 80.0–100.0)
Monocytes Absolute: 0.7 10*3/uL (ref 0.1–1.0)
Monocytes Relative: 9 %
Neutro Abs: 4.3 10*3/uL (ref 1.7–7.7)
Neutrophils Relative %: 61 %
Platelets: 217 10*3/uL (ref 150–400)
RBC: 4.45 MIL/uL (ref 3.87–5.11)
RDW: 12.4 % (ref 11.5–15.5)
WBC: 7 10*3/uL (ref 4.0–10.5)
nRBC: 0 % (ref 0.0–0.2)

## 2021-12-17 LAB — COMPREHENSIVE METABOLIC PANEL
ALT: 9 U/L (ref 0–44)
AST: 17 U/L (ref 15–41)
Albumin: 3.7 g/dL (ref 3.5–5.0)
Alkaline Phosphatase: 95 U/L (ref 38–126)
Anion gap: 7 (ref 5–15)
BUN: 20 mg/dL (ref 8–23)
CO2: 28 mmol/L (ref 22–32)
Calcium: 9.3 mg/dL (ref 8.9–10.3)
Chloride: 103 mmol/L (ref 98–111)
Creatinine, Ser: 1.22 mg/dL — ABNORMAL HIGH (ref 0.44–1.00)
GFR, Estimated: 41 mL/min — ABNORMAL LOW (ref >60)
Glucose, Bld: 110 mg/dL — ABNORMAL HIGH (ref 70–99)
Potassium: 4.3 mmol/L (ref 3.5–5.1)
Sodium: 138 mmol/L (ref 135–145)
Total Bilirubin: 0.6 mg/dL (ref 0.3–1.2)
Total Protein: 7 g/dL (ref 6.5–8.1)

## 2021-12-17 MED ORDER — IOHEXOL 300 MG/ML  SOLN
75.0000 mL | Freq: Once | INTRAMUSCULAR | Status: AC | PRN
  Administered 2021-12-17: 75 mL via INTRAVENOUS

## 2021-12-17 MED ORDER — LIDOCAINE 5 % EX PTCH
1.0000 | MEDICATED_PATCH | CUTANEOUS | Status: DC
Start: 2021-12-17 — End: 2021-12-17
  Administered 2021-12-17: 1 via TRANSDERMAL
  Filled 2021-12-17: qty 1

## 2021-12-17 MED ORDER — LIDOCAINE 5 % EX PTCH: 1.0000 | MEDICATED_PATCH | Freq: Two times a day (BID) | CUTANEOUS | 0 refills | Status: AC

## 2021-12-17 NOTE — ED Notes (Signed)
Patient transported to CT 

## 2021-12-17 NOTE — ED Triage Notes (Signed)
Pt had a mechanical fall last Thursday, denies head injury. She has continued to c/o back pain along her spine. Pt was able to independently get out of vehicle.

## 2021-12-17 NOTE — ED Notes (Addendum)
See triage note. Pt brought to room via wheelchair and 1 person stand by assist to bed. PA currently in room for assessment. Pt accompanied by family. Pt sustained a fall a few days ago and has ongoing back pain and generalized abdominal pain. Pt also reports a few episodes of diarrhea and constipation

## 2021-12-17 NOTE — TOC Initial Note (Signed)
Transition of Care [facility]) - Initial/Assessment Note    Patient Details  Name: Becky Prince MRN: [id number] Date of Birth: [birth year]  Transition of Care [facility]) CM/SW Contact:    Shelbie Hutching, RN Phone Number: 12/17/2021, 11:23 AM  Clinical Narrative:                 Patient seen in the emergency room for continued back pain after a fall last Thursday.  Patient was found to have some compression fractures in her spine.  TLSO brace has been ordered and will be delivered to the hospital today.  RNCM met with patient and her family, daughter and son, at the bedside in the emergency room.  Patient lives alone and family would like some help for her at home.  They agree that PT and OT may be beneficial as well as some personal home care services which they understand are private pay.  Patient has [employer], RNCM checking with [facility] to see if they have a contract for some personal care services through [employer].  Meg with [facility] has accepted the referral for PT and OT. RNCM left a message for [employer] in [place].    Expected Discharge Plan: [place] Barriers to Discharge: Equipment Delay   Patient Goals and CMS Choice Patient states their goals for this hospitalization and ongoing recovery are:: to get back home CMS Medicare.gov Compare Post Acute Care list provided to:: Patient Choice offered to / list presented to : Patient  Expected Discharge Plan and Services Expected Discharge Plan: [place]   Discharge Planning Services: CM Consult Post Acute Care Choice: [place] arrangements for the past 2 months: Single Family Home                 DME Arranged: N/A         HH Arranged: PT, OT HH Agency: [place] Date HH Agency Contacted: 12/17/21 Time HH Agency Contacted: 1110 Representative spoke with at [place] Arrangements/Services Living arrangements for the past 2  months: [place] with:: Self Patient language and need for interpreter reviewed:: Yes Do you feel safe going back to the place where you live?: Yes      Need for Family Participation in Patient Care: Yes (Comment) Care giver support system in place?: Yes (comment) Current home services: DME ([facility]) Criminal Activity/Legal Involvement Pertinent to Current Situation/Hospitalization: No - Comment as needed  Activities of Daily Living      Permission Sought/Granted Permission sought to share information with : Case Manager, Other (comment), Family Supports Permission granted to share information with : Yes, Verbal Permission Granted  Share Information with NAME: Irineo Axon  Permission granted to share info w AGENCY: home health  Permission granted to share info w Relationship: daughter  Permission granted to share info w Contact Information: [phone]  Emotional Assessment Appearance:: Appears stated age Attitude/Demeanor/Rapport: Engaged Affect (typically observed): Accepting Orientation: : Oriented to Self, Oriented to Place, Oriented to  Time, Oriented to Situation Alcohol / Substance Use: Not Applicable Psych Involvement: No (comment)  Admission diagnosis:  fall back pain Patient Active Problem List   Diagnosis Date Noted   MDD (major depressive disorder) 09/29/2021   Bilateral hip pain 09/10/2020   Stage 3b chronic kidney disease ([place]) 09/10/2020   Bilateral pubic rami fractures ([place]) 10/03/2019   Night sweats 02/13/2019   Mixed dementia ([place]) 03/17/2018   Dupuytren's contracture  of both hands 03/17/2018   Cerebrovascular disease 10/06/2016   Multinodular goiter 10/06/2016   TIA (transient ischemic attack) 09/28/2016   Osteoporosis 10/14/2015   Constipation 04/12/2015   Contracture of finger joint 04/12/2015   Hypercholesteremia 04/12/2015   BP (high blood pressure) 04/12/2015   Allergic rhinitis, seasonal 04/12/2015   Subclinical  hyperthyroidism 11/16/2014   PCP:  Virginia Crews, MD Pharmacy:   [id number] [place] [id number]: [phone]: [phone]    Social Determinants of Health (SDOH) Interventions    Readmission Risk Interventions     No data to display

## 2021-12-17 NOTE — ED Notes (Signed)
Pt verbalized understanding of discharge instructions. Opportunity for questions provided.  

## 2021-12-17 NOTE — ED Notes (Signed)
TLSO representative called and states may take a couple hours for brace to be delivered.

## 2021-12-17 NOTE — ED Provider Notes (Signed)
[facility] Provider Note    Event Date/Time   First MD Initiated Contact with Patient 12/17/21 0732     (approximate)   History   Fall   HPI  Becky Prince is a 88 y.o. female who presents today with her daughter for evaluation after a fall that occurred 6 days ago.  Patient reports that she was walking when she heard the phone ring so she turned around quickly and lost her balance.  She reports that she fell onto a carpeted ground.  She denies head strike or LOC.  She was able to get herself up and back into bed.  She reports that since that time she has been having back pain.  She is still able to ambulate.  Her daughter also notes that she has been complaining of abdominal pain over the last couple of days and had diarrhea and then was constipated.  No abdominal pain currently.  She has not had any nausea or vomiting.  She denies any headaches, visual changes, or neck pain.  She is been complaining primarily of upper and lower back pain.  No urinary or fecal incontinence or retention.  No saddle anesthesia.  No dysuria.  Patient Active Problem List   Diagnosis Date Noted   MDD (major depressive disorder) 09/29/2021   Bilateral hip pain 09/10/2020   Stage 3b chronic kidney disease ([place]) 09/10/2020   Bilateral pubic rami fractures ([place]) 10/03/2019   Night sweats 02/13/2019   Mixed dementia ([place]) 03/17/2018   Dupuytren's contracture of both hands 03/17/2018   Cerebrovascular disease 10/06/2016   Multinodular goiter 10/06/2016   TIA (transient ischemic attack) 09/28/2016   Osteoporosis 10/14/2015   Constipation 04/12/2015   Contracture of finger joint 04/12/2015   Hypercholesteremia 04/12/2015   BP (high blood pressure) 04/12/2015   Allergic rhinitis, seasonal 04/12/2015   Subclinical hyperthyroidism 11/16/2014          Physical Exam   Triage Vital Signs: ED Triage Vitals  Enc Vitals Group     BP 12/17/21 0729 (!) 177/90     Pulse Rate  12/17/21 0729 89     Resp 12/17/21 0729 16     Temp 12/17/21 0729 98.4 F (36.9 C)     Temp Source 12/17/21 0729 Oral     SpO2 12/17/21 0729 96 %     Weight 12/17/21 0730 123 lb 7.3 oz (56 kg)     Height 12/17/21 0730 5\' 8"  (1.727 m)     Head Circumference --      Peak Flow --      Pain Score 12/17/21 0730 8     Pain Loc --      Pain Edu? --      Excl. in [place]? --     Most recent vital signs: Vitals:   12/17/21 0729 12/17/21 1230  BP: (!) 177/90 (!) 155/75  Pulse: 89 66  Resp: 16 16  Temp: 98.4 F (36.9 C) 98 F (36.7 C)  SpO2: 96% 97%    Physical Exam Vitals and nursing note reviewed.  Constitutional:      General: Awake and alert. No acute distress.    Appearance: Normal appearance. The patient is normal weight.  HENT:     Head: Normocephalic and atraumatic.  No periorbital tenderness or ecchymosis.  No midface tenderness.  No mandibular tenderness.  No battle sign or raccoon eyes    Mouth: Mucous membranes are moist.  Eyes:     General: PERRL. Normal  EOMs        Right eye: No discharge.        Left eye: No discharge.     Conjunctiva/sclera: Conjunctivae normal.  Cardiovascular:     Rate and Rhythm: Normal rate and regular rhythm.     Pulses: Normal pulses.     Heart sounds: Normal heart sounds Pulmonary:     Effort: Pulmonary effort is normal. No respiratory distress.     Breath sounds: Normal breath sounds.  No chest wall tenderness or ecchymosis. Abdominal:     Abdomen is soft. There is mild left lower quadrant abdominal tenderness. No rebound or guarding. No distention. Musculoskeletal:        General: No swelling. Normal range of motion.     Cervical back: Normal range of motion and neck supple.  No midline cervical spine tenderness.  Full range of motion of neck.  Negative Spurling test.  Negative Lhermitte sign.  Normal strength and sensation in bilateral upper extremities. Normal grip strength bilaterally.  Normal intrinsic muscle function of the hand  bilaterally.  Normal radial pulses bilaterally. There is mild midline thoracic tenderness and right-sided paraspinal tenderness without ecchymosis or wounds. Strength and sensation 5/5 to bilateral lower extremities. Normal great toe extension against resistance. Normal sensation throughout feet. Normal patellar reflexes. Negative SLR and opposite SLR bilaterally.  Skin:    General: Skin is warm and dry.     Capillary Refill: Capillary refill takes less than 2 seconds.     Findings: No rash.  Neurological:     Mental Status: The patient is awake and alert.  Neurological: GCS 15 alert and oriented x3 Normal speech, no expressive or receptive aphasia or dysarthria Cranial nerves II through XII intact Normal visual fields 5 out of 5 strength in all 4 extremities with intact sensation throughout No extremity drift Normal finger-to-nose testing, no limb or truncal ataxia     ED Results / Procedures / Treatments   Labs (all labs ordered are listed, but only abnormal results are displayed) Labs Reviewed  COMPREHENSIVE METABOLIC PANEL - Abnormal; Notable for the following components:      Result Value   Glucose, Bld 110 (*)    Creatinine, Ser 1.22 (*)    GFR, Estimated 41 (*)    All other components within normal limits  CBC WITH DIFFERENTIAL/PLATELET  URINALYSIS, ROUTINE W REFLEX MICROSCOPIC     EKG     RADIOLOGY I independently reviewed and interpreted imaging and agree with radiologists findings.     PROCEDURES:  Critical Care performed:   Procedures   MEDICATIONS ORDERED IN ED: Medications  lidocaine (LIDODERM) 5 % 1 patch (1 patch Transdermal Patch Applied 12/17/21 0805)  iohexol (OMNIPAQUE) 300 MG/ML solution 75 mL (75 mLs Intravenous Contrast Given 12/17/21 0900)     IMPRESSION / MDM / ASSESSMENT AND PLAN / ED COURSE  I reviewed the triage vital signs and the nursing notes.   Differential diagnosis includes, but is not limited to, fracture, contusion,  compression fracture, gastroenteritis, diverticulitis, intracranial hemorrhage.  Patient is awake and alert, hemodynamically stable and afebrile.  She has full normal range of motion of all 4 extremities.  She is able to ambulate unassisted.  She does have mild tenderness along her thoracic spine and paraspinal muscle area.  CT head and neck obtained per [place] criteria, as well as CT abdomen and pelvis with T-spine and L-spine.  CT scans reveal acute compression fracture of the T7 vertebral body with approximately 50% loss of  vertebral body height and 5 mm of bony retropulsion without cord compression, as well as acute compression fracture of the L1 vertebral body with approximately 20% loss of vertebral body height anteriorly and minimal bony retropulsion without significant spinal canal stenosis, and a possible acute fracture along the anterior cortex of the S3 vertebral body.  I discussed these findings with Dr. Cari Caraway with neurosurgery who feels that TLSO bracing is her best option.  He does not feel that she is a good surgical candidate.  He also reports that kyphoplasty/vertebroplasty is contraindicated when there is retropulsion so this is not an option for her.  We discussed the possibility of safety concern, which I also discussed with the patient and her daughter.  Patient is adamant that she does not want to be admitted to the hospital.  Daughter feels comfortable with her being discharged home.  However, daughter does report that she is going on a trip, and patient lives alone.  Therefore I was able to consult case management, and Gina with case management was able to arrange home PT/OT and home health.  Patient and daughter are very happy with this.  Patient reports that she feels much more comfortable with a TLSO brace.  She also requested a prescription for the Lidoderm patches which was prescribed.  Patient and daughter are happy with overall plan and care.  I recommended close outpatient  follow-up with Dr. Cari Caraway and the appropriate information was provided.  We also discussed tricked return precautions.  Patient and daughter understand and agree with plan.  Discharged in stable condition.   Patient's presentation is most consistent with acute complicated illness / injury requiring diagnostic workup.  Clinical Course as of 12/17/21 1326  Wed Dec 17, 2021  0956 IMPRESSION: 1. Acute compression fracture of the L1 vertebral body with approximately 20% loss of vertebral body height anteriorly and minimal bony retropulsion but no significant spinal canal stenosis. 2. Possible acute fracture along the anterior cortex of the S3 vertebral body.   [JP]  [id number] IMPRESSION: Acute compression fracture of the T7 vertebral body with approximately 50% loss of vertebral body height and 5 mm of bony retropulsion but without likely cord compression.   [JP]  1761 Discussed with Dr. Cari Caraway who reviewed the images.  He recommends TLSO brace, however neither of these fractures are unstable so she does not have to wear it all the time.  He feels that this is best managed with bracing, surgery, or nothing, and he feels that she is a poor surgical candidate.  Not a candidate for kyphosis/vertebroplasty given the retropulsed fragment.  Might be a safety concern at home, therefore admit versus home nursing care.  Case management has been consulted [JP]  1019 Discussed with CM who will come to see patient and family [JP]  1156 CM Barnett Applebaum reports that PT/OT and home health is all set up for the patient [JP]  1202 Patient has her TLSO brace and feels significant improvement of her symptoms [JP]    Clinical Course User Index [JP] Nyanna Heideman, Clarnce Flock, PA-C     FINAL CLINICAL IMPRESSION(S) / ED DIAGNOSES   Final diagnoses:  Closed wedge compression fracture of T7 vertebra, initial encounter ([place])  Closed compression fracture of body of L1 vertebra ([place])  Fall in home, initial encounter     Rx /  DC Orders   ED Discharge Orders          Ordered    lidocaine (LIDODERM) 5 %  Every 12 hours  12/17/21 1202             Note:  This document was prepared using Dragon voice recognition software and may include unintentional dictation errors.   Emeline Gins 12/17/21 1326    Arta Silence, MD 12/17/21 [phone]

## 2021-12-17 NOTE — Discharge Instructions (Signed)
You were found to have a compression fracture of your T7 vertebrae in your L1 vertebrae.  You are given a TLSO brace for this.  You may also use the patches as prescribed.  The case manager was able to arrange PT/OT, and home health for you.  I discussed your findings with the neurosurgeon on-call who was recommended outpatient follow-up with his office.  Please call him to make an appointment soon as possible.  Please return for any new, worsening, or change in symptoms or other concerns.  It was a pleasure caring for you today.

## 2021-12-17 NOTE — Progress Notes (Signed)
Orthopedic Tech Progress Note Patient Details:  Becky Prince December 10, 1925 [id number] TLSO has been ordered from [facility]  Patient ID: Becky Prince, female   DOB: [birth year], 88 y.o.   MRN: [id number]  Jearld Lesch 12/17/2021, 10:41 AM

## 2021-12-18 ENCOUNTER — Telehealth: Payer: Self-pay

## 2021-12-18 DIAGNOSIS — S22060D Wedge compression fracture of T7-T8 vertebra, subsequent encounter for fracture with routine healing: Secondary | ICD-10-CM | POA: Diagnosis not present

## 2021-12-18 DIAGNOSIS — M24541 Contracture, right hand: Secondary | ICD-10-CM | POA: Diagnosis not present

## 2021-12-18 DIAGNOSIS — W19XXXD Unspecified fall, subsequent encounter: Secondary | ICD-10-CM | POA: Diagnosis not present

## 2021-12-18 DIAGNOSIS — M810 Age-related osteoporosis without current pathological fracture: Secondary | ICD-10-CM | POA: Diagnosis not present

## 2021-12-18 DIAGNOSIS — N1832 Chronic kidney disease, stage 3b: Secondary | ICD-10-CM | POA: Diagnosis not present

## 2021-12-18 DIAGNOSIS — M24542 Contracture, left hand: Secondary | ICD-10-CM | POA: Diagnosis not present

## 2021-12-18 DIAGNOSIS — S32010D Wedge compression fracture of first lumbar vertebra, subsequent encounter for fracture with routine healing: Secondary | ICD-10-CM | POA: Diagnosis not present

## 2021-12-18 DIAGNOSIS — E7800 Pure hypercholesterolemia, unspecified: Secondary | ICD-10-CM | POA: Diagnosis not present

## 2021-12-18 DIAGNOSIS — F0390 Unspecified dementia without behavioral disturbance: Secondary | ICD-10-CM | POA: Diagnosis not present

## 2021-12-18 DIAGNOSIS — I129 Hypertensive chronic kidney disease with stage 1 through stage 4 chronic kidney disease, or unspecified chronic kidney disease: Secondary | ICD-10-CM | POA: Diagnosis not present

## 2021-12-18 NOTE — Telephone Encounter (Signed)
Just an FYI   Copied from [place] [id number]. Topic: General - Inquiry >> Dec 18, 2021 10:47 AM Erskine Squibb wrote: Reason for CRM: The daughter of the patient, Becky Prince wanted the provider to know she was seen at [facility] yesterday for a fall she sustained last week. She has 2 fractured vertebrae and has follow up with neurologist Dr Cari Caraway in a few weeks.

## 2021-12-19 ENCOUNTER — Telehealth: Payer: Self-pay | Admitting: Family Medicine

## 2021-12-19 ENCOUNTER — Telehealth: Payer: Self-pay

## 2021-12-19 NOTE — Telephone Encounter (Signed)
Spoke with insurance again. They explained the same thing that I already told the daughter.

## 2021-12-19 NOTE — Telephone Encounter (Signed)
Pts daughter is calling to report that the ED sent over script for lidocaine (LIDODERM) 5 % [[id number]] . The medication is needing a PA. Requesting if office can submit a PA for this script. Please advise  CB- [phone] [facility] [place] [place] HTA- [phone] select option 2   For the Pts spine needing 3x5 - needing more than 10, needing 30 or 60

## 2021-12-19 NOTE — Telephone Encounter (Signed)
-----   Message from Peggyann Shoals sent at 12/19/2021  7:59 AM EDT ----- Regarding: verbal order Contact: [phone] Raquel Sarna from [place] Verbal order for PT 1 x 1 2 x 3 1 x 1

## 2021-12-19 NOTE — Telephone Encounter (Signed)
Your thoughts on this? Dr. Izora Ribas got a call about her when she was in the hospital but she was not seen as a consult.

## 2021-12-19 NOTE — Telephone Encounter (Signed)
Spoke with insurance and patient's daughter. Explained to daughter that a PA will likely not be approved as it is available OTC at almost the same strength.

## 2021-12-19 NOTE — Telephone Encounter (Signed)
C-1800 [place] calling from [place] team is calling back to speak to [place].

## 2021-12-19 NOTE — Telephone Encounter (Signed)
-----   Message from Peggyann Shoals sent at 12/19/2021 10:22 AM EDT ----- Regarding: remove bar from brace Contact: [phone] [facility] FU- T7 and L1 compression fractures Becky Prince pt's daughter calling There is a sternal bar that comes up the front to her brace up to her neck. It is very irritating to patient. Scott at [facility] said that he can remove the bar but he needs verbal order from the office. Would you consider having the bar removed?

## 2021-12-19 NOTE — Telephone Encounter (Signed)
Patient daughter has been notified and verbalized understanding

## 2021-12-19 NOTE — Telephone Encounter (Signed)
I spoke with Becky Prince and gave her a verbal order.

## 2021-12-22 DIAGNOSIS — I10 Essential (primary) hypertension: Secondary | ICD-10-CM | POA: Diagnosis not present

## 2021-12-22 DIAGNOSIS — F0390 Unspecified dementia without behavioral disturbance: Secondary | ICD-10-CM | POA: Diagnosis not present

## 2021-12-22 DIAGNOSIS — M1990 Unspecified osteoarthritis, unspecified site: Secondary | ICD-10-CM | POA: Diagnosis not present

## 2021-12-26 ENCOUNTER — Other Ambulatory Visit: Payer: Self-pay | Admitting: Family Medicine

## 2021-12-26 DIAGNOSIS — S22060D Wedge compression fracture of T7-T8 vertebra, subsequent encounter for fracture with routine healing: Secondary | ICD-10-CM | POA: Diagnosis not present

## 2021-12-26 DIAGNOSIS — S32010D Wedge compression fracture of first lumbar vertebra, subsequent encounter for fracture with routine healing: Secondary | ICD-10-CM | POA: Diagnosis not present

## 2021-12-26 DIAGNOSIS — M24541 Contracture, right hand: Secondary | ICD-10-CM | POA: Diagnosis not present

## 2021-12-26 DIAGNOSIS — I129 Hypertensive chronic kidney disease with stage 1 through stage 4 chronic kidney disease, or unspecified chronic kidney disease: Secondary | ICD-10-CM | POA: Diagnosis not present

## 2021-12-26 DIAGNOSIS — W19XXXD Unspecified fall, subsequent encounter: Secondary | ICD-10-CM | POA: Diagnosis not present

## 2021-12-26 DIAGNOSIS — F0390 Unspecified dementia without behavioral disturbance: Secondary | ICD-10-CM | POA: Diagnosis not present

## 2021-12-26 DIAGNOSIS — M24542 Contracture, left hand: Secondary | ICD-10-CM | POA: Diagnosis not present

## 2021-12-26 DIAGNOSIS — N1832 Chronic kidney disease, stage 3b: Secondary | ICD-10-CM | POA: Diagnosis not present

## 2021-12-26 DIAGNOSIS — M810 Age-related osteoporosis without current pathological fracture: Secondary | ICD-10-CM | POA: Diagnosis not present

## 2021-12-26 DIAGNOSIS — E7800 Pure hypercholesterolemia, unspecified: Secondary | ICD-10-CM | POA: Diagnosis not present

## 2021-12-26 NOTE — Telephone Encounter (Signed)
Requested Prescriptions  Pending Prescriptions Disp Refills  . atorvastatin (LIPITOR) 40 MG tablet [Pharmacy Med Name: ATORVASTATIN 40 MG TABLET] 90 tablet 0    Sig: TAKE 1 TABLET BY MOUTH EVERY DAY     Cardiovascular:  Antilipid - Statins Failed - 12/26/2021  2:06 AM      Failed - Lipid Panel in normal range within the last 12 months    Cholesterol, Total  Date Value Ref Range Status  09/29/2021 146 100 - 199 mg/dL Final   LDL Chol Calc (NIH)  Date Value Ref Range Status  09/29/2021 66 0 - 99 mg/dL Final   HDL  Date Value Ref Range Status  09/29/2021 60 >39 mg/dL Final   Triglycerides  Date Value Ref Range Status  09/29/2021 113 0 - 149 mg/dL Final         Passed - Patient is not pregnant      Passed - Valid encounter within last 12 months    Recent Outpatient Visits          2 months ago Encounter for annual wellness visit (AWV) in Medicare patient   [facility], [name], MD   10 months ago Mixed dementia [facility])   [place], [name], MD   1 year ago Encounter for annual physical exam   [facility] [place], [name], MD   1 year ago Primary hypertension   [facility] [place], [name], MD   2 years ago Closed bilateral fracture of pubic rami with routine healing, subsequent encounter   [facility] [place], [name], MD      Future Appointments            In 3 months [place], [name], MD [facility], [place]

## 2021-12-30 DIAGNOSIS — S22060A Wedge compression fracture of T7-T8 vertebra, initial encounter for closed fracture: Secondary | ICD-10-CM | POA: Diagnosis not present

## 2022-01-01 DIAGNOSIS — S22060A Wedge compression fracture of T7-T8 vertebra, initial encounter for closed fracture: Secondary | ICD-10-CM | POA: Diagnosis not present

## 2022-01-08 ENCOUNTER — Ambulatory Visit: Payer: PPO | Admitting: Orthopedic Surgery

## 2022-01-08 DIAGNOSIS — S22060A Wedge compression fracture of T7-T8 vertebra, initial encounter for closed fracture: Secondary | ICD-10-CM | POA: Diagnosis not present

## 2022-01-08 NOTE — Progress Notes (Unsigned)
Referring Physician:  Arta Silence, MD [place],  [place] [address]  Primary Physician:  Virginia Crews, MD  History of Present Illness:  Seen in ED on 12/17/21 for evaluation after a fall that occurred 12/11/21. Found to have T7 and L1 compression fractures. ED discussed with Dr. Izora Ribas and he recommended a TLSO brace. HHPT/OT set up from ED.   She is here for follow up.   She has history of dementia.   She is not having any pain in her back. She has a TLSO brace and is not wearing it because "she doesn't like it." No radiation of pain into her arms or legs. No weakness noted.   Review of Systems:  A 10 point review of systems is negative, except for the pertinent positives and negatives detailed in the HPI.  Past Medical History: Past Medical History:  Diagnosis Date   Depression    Environmental allergies    Hypercholesteremia    Hypertension     Past Surgical History: Past Surgical History:  Procedure Laterality Date   ABDOMINAL HYSTERECTOMY     CATARACT EXTRACTION W/PHACO Right 06/12/2015   Procedure: CATARACT EXTRACTION PHACO AND INTRAOCULAR LENS PLACEMENT (IOC);  Surgeon: Leandrew Koyanagi, MD;  Location: [facility];  Service: Ophthalmology;  Laterality: Right;    Allergies: Allergies as of 01/09/2022 - Review Complete 01/09/2022  Allergen Reaction Noted   Naproxen Swelling 11/26/2014   Oxycodone-aspirin Swelling 11/26/2014   Prednisone Diarrhea 06/04/2016    Medications: Outpatient Encounter Medications as of 01/09/2022  Medication Sig   acetaminophen (TYLENOL) 500 MG tablet Take 2 tablets (1,000 mg total) by mouth 3 (three) times daily as needed for mild pain (or Fever >/= 101).   atorvastatin (LIPITOR) 40 MG tablet TAKE 1 TABLET BY MOUTH EVERY DAY   citalopram (CELEXA) 20 MG tablet Take 1 tablet (20 mg total) by mouth daily.   donepezil (ARICEPT) 10 MG tablet Take 1 tablet (10 mg total) by mouth at bedtime.    HYDROcodone-acetaminophen (NORCO/VICODIN) 5-325 MG tablet Take 0.5 tablets by mouth every 6 (six) hours as needed.   mirtazapine (REMERON) 15 MG tablet TAKE 1 TABLET BY MOUTH EVERYDAY AT BEDTIME   spironolactone (ALDACTONE) 25 MG tablet TAKE 1 TABLET BY MOUTH EVERY DAY   traMADol (ULTRAM) 50 MG tablet Take 50 mg by mouth every 6 (six) hours as needed.   No facility-administered encounter medications on file as of 01/09/2022.    Social History: Social History   Tobacco Use   Smoking status: Never   Smokeless tobacco: Never  Vaping Use   Vaping Use: Never used  Substance Use Topics   Alcohol use: No    Alcohol/week: 0.0 standard drinks of alcohol   Drug use: No    Family Medical History: Family History  Problem Relation Age of Onset   Heart disease Mother        CHF   Hypertension Mother    Cancer Father        prostate   Cancer Sister        breast   Kidney disease Brother    Breast cancer Neg Hx     Physical Examination: Vitals:   01/09/22 1321  BP: (!) 165/85  Pulse: 74    General: Patient is well developed, well nourished, calm, collected, and in no apparent distress. Attention to examination is appropriate.  Respiratory: Patient is breathing without any difficulty.   NEUROLOGICAL:     She is pleasantly confused.  Able to answer questions and follow commands.   Cranial Nerves: Pupils equal round and reactive to light.  Facial tone is symmetric.  Facial sensation is symmetric.  No abnormal lesions on exposed skin.   Strength: Side Biceps Triceps Deltoid Interossei Grip Wrist Ext. Wrist Flex.  R 5 5 5  N/A 5 5 5   L 5 5 5  N/A 5 5 5    Side Iliopsoas Quads Hamstring PF DF EHL  R 5 5 5 5 5 5   L 5 5 5 5 5 5    Reflexes are 2+ and symmetric at the biceps, triceps, brachioradialis, patella and achilles.   Hoffman's is absent.  Clonus is not present.   Bilateral upper and lower extremity sensation is intact to light touch.     Right hand shows flexion  contracture of middle/ring/small finger and partial flexion contracture of index finger.   Left hand shows flexion contracture of small and ring finger.   Gait is normal. She ambulates with a cane.   Medical Decision Making  Imaging: Xrays of thoracic and lumbar spine dated 01/09/22:  Stable compression fractures at T7 and L1.   CT of thoracic spine 12/17/21:  FINDINGS: Alignment: Normal.   Vertebrae: There is an acute compression fracture of the T7 vertebral body with up to approximately 50% loss of vertebral body height anteriorly. There is bony retropulsion measuring approximately 5 mm resulting in mild spinal canal stenosis without likely cord compression. The other vertebral body heights are preserved. There is no suspicious osseous lesion.   Paraspinal and other soft tissues: Paraspinal soft tissues are unremarkable there is calcified atherosclerotic plaque of the thoracic aorta. Coronary artery calcifications are noted.   Disc levels: There is overall minimal background degenerative change of the thoracic spine. There is no other evidence of significant spinal canal or neural foraminal stenosis.   IMPRESSION: Acute compression fracture of the T7 vertebral body with approximately 50% loss of vertebral body height and 5 mm of bony retropulsion but without likely cord compression.     Electronically Signed   By: Valetta Mole M.D.   On: 12/17/2021 09:44  CT of lumbar spine 12/17/21:  FINDINGS: Segmentation: Standard; the lowest formed disc space is designated L5-S1.   Alignment: There is significant levocurvature centered at L2-L3 with mild left lateral listhesis of L3 on L4. There is trace anterolisthesis of L3 on L4 through L5 on S1, similar to 2021. There is no evidence of traumatic malalignment.   Vertebrae: There is acute compression fracture of the L1 vertebral body with approximately 20% loss of vertebral body height anteriorly and minimal bony retropulsion  but no significant spinal canal stenosis. There is no extension to the posterior elements.   The other vertebral body heights are preserved.   There is a possible acute fracture along the anterior cortex of the S3 vertebral body.   There is no other evidence of acute fracture.   Paraspinal and other soft tissues: The paraspinal soft tissues are unremarkable. The abdominal and pelvic viscera are assessed on the separately dictated CT abdomen/pelvis.   Disc levels: There is advanced multilevel disc space narrowing with vacuum disc phenomenon throughout the lumbar spine. Facet arthropathy is most advanced at L3-L4 through L5-S1. There are mild disc bulges throughout the lumbar spine. Findings result in up to moderate spinal canal stenosis and severe right neural foraminal stenosis at L3-L4. No other evidence of high-grade spinal canal or neural foraminal stenosis.   IMPRESSION: 1. Acute compression fracture of the L1  vertebral body with approximately 20% loss of vertebral body height anteriorly and minimal bony retropulsion but no significant spinal canal stenosis. 2. Possible acute fracture along the anterior cortex of the S3 vertebral body. 3. Multilevel degenerative changes most advanced at L3-L4 where there is moderate spinal canal stenosis and severe right neural foraminal stenosis.     Electronically Signed   By: Valetta Mole M.D.   On: 12/17/2021 09:50   I have personally reviewed the images and agree with the above interpretation.  Assessment and Plan: Ms. Cuervo is a pleasant 88 y.o. female with T7 and L1 compression fractures after fall around 12/11/21. She has mild back pain that is improving. No arm or leg pain. No numbness, tingling, or weakness.   Xrays from today show fractures at T7 and L1 are stable.   Above treatment options discussed with patient and following plan made:   - Recommend she continue with TLSO brace. Do not wear when sleeping. Discussed  importance of wearing brace even though it is uncomfortable.  - No bending, twisting, or lifting.  - Follow up in 4-6 weeks for repeat thoracic and lumbar xrays.   I spent a total of 30 minutes in face-to-face and non-face-to-face activities related to this patient's care today.  Thank you for involving me in the care of this patient.   Geronimo Boot PA-C Dept. of Neurosurgery

## 2022-01-09 ENCOUNTER — Encounter: Payer: Self-pay | Admitting: Orthopedic Surgery

## 2022-01-09 ENCOUNTER — Ambulatory Visit (INDEPENDENT_AMBULATORY_CARE_PROVIDER_SITE_OTHER): Payer: PPO | Admitting: Orthopedic Surgery

## 2022-01-09 ENCOUNTER — Ambulatory Visit
Admission: RE | Admit: 2022-01-09 | Discharge: 2022-01-09 | Disposition: A | Discharge status: Home / Self Care | Payer: PPO | Source: Ambulatory Visit | Attending: Orthopedic Surgery | Admitting: Orthopedic Surgery

## 2022-01-09 ENCOUNTER — Other Ambulatory Visit: Payer: Self-pay

## 2022-01-09 ENCOUNTER — Ambulatory Visit
Admission: RE | Admit: 2022-01-09 | Discharge: 2022-01-09 | Disposition: A | Discharge status: Home / Self Care | Payer: PPO | Attending: Orthopedic Surgery | Admitting: Orthopedic Surgery

## 2022-01-09 VITALS — BP 165/85 | HR 74 | Ht 68.0 in | Wt 119.8 lb

## 2022-01-09 DIAGNOSIS — S22060A Wedge compression fracture of T7-T8 vertebra, initial encounter for closed fracture: Secondary | ICD-10-CM

## 2022-01-09 DIAGNOSIS — M419 Scoliosis, unspecified: Secondary | ICD-10-CM | POA: Diagnosis not present

## 2022-01-09 DIAGNOSIS — S32010A Wedge compression fracture of first lumbar vertebra, initial encounter for closed fracture: Secondary | ICD-10-CM | POA: Insufficient documentation

## 2022-01-09 DIAGNOSIS — Z8739 Personal history of other diseases of the musculoskeletal system and connective tissue: Secondary | ICD-10-CM | POA: Diagnosis not present

## 2022-01-09 NOTE — Patient Instructions (Signed)
It was so nice to see you today, I am glad you are feeling better.   You broke two bones in your back- one at T7 and one at L1. Xrays from today show fractures are healing. They will take up to 3 months to heal.   I recommend you continue with the TLSO brace (even though you don't like it). This will help protect your back to give you time to heal. No bending, twisting, or lifting.   Follow up with repeat xrays in 4-6 weeks.   Call with any questions or concerns.   Geronimo Boot PA-C [phone]

## 2022-01-13 DIAGNOSIS — S22060A Wedge compression fracture of T7-T8 vertebra, initial encounter for closed fracture: Secondary | ICD-10-CM | POA: Diagnosis not present

## 2022-01-15 ENCOUNTER — Other Ambulatory Visit: Payer: Self-pay | Admitting: Family Medicine

## 2022-01-15 DIAGNOSIS — S22060A Wedge compression fracture of T7-T8 vertebra, initial encounter for closed fracture: Secondary | ICD-10-CM | POA: Diagnosis not present

## 2022-01-16 ENCOUNTER — Telehealth: Payer: Self-pay | Admitting: Family Medicine

## 2022-01-16 NOTE — Telephone Encounter (Signed)
[place] for verbals   Eulis Foster, MD

## 2022-01-16 NOTE — Telephone Encounter (Signed)
Left detailed message for Becky Prince with approved orders.

## 2022-01-16 NOTE — Telephone Encounter (Signed)
Home Health Verbal Orders - Caller/Agency: Cherly Anderson /[facility]  Callback Number: [phone] /vm can be left  Requesting PT Frequency: extend her 1 x a week for 4 weeks

## 2022-02-04 DIAGNOSIS — M48062 Spinal stenosis, lumbar region with neurogenic claudication: Secondary | ICD-10-CM | POA: Diagnosis not present

## 2022-02-04 DIAGNOSIS — M5416 Radiculopathy, lumbar region: Secondary | ICD-10-CM | POA: Diagnosis not present

## 2022-02-06 DIAGNOSIS — S22060D Wedge compression fracture of T7-T8 vertebra, subsequent encounter for fracture with routine healing: Secondary | ICD-10-CM | POA: Diagnosis not present

## 2022-02-06 DIAGNOSIS — W19XXXD Unspecified fall, subsequent encounter: Secondary | ICD-10-CM | POA: Diagnosis not present

## 2022-02-06 DIAGNOSIS — M24541 Contracture, right hand: Secondary | ICD-10-CM | POA: Diagnosis not present

## 2022-02-06 DIAGNOSIS — I129 Hypertensive chronic kidney disease with stage 1 through stage 4 chronic kidney disease, or unspecified chronic kidney disease: Secondary | ICD-10-CM | POA: Diagnosis not present

## 2022-02-06 DIAGNOSIS — M24542 Contracture, left hand: Secondary | ICD-10-CM | POA: Diagnosis not present

## 2022-02-06 DIAGNOSIS — S32010D Wedge compression fracture of first lumbar vertebra, subsequent encounter for fracture with routine healing: Secondary | ICD-10-CM | POA: Diagnosis not present

## 2022-02-06 DIAGNOSIS — E7800 Pure hypercholesterolemia, unspecified: Secondary | ICD-10-CM | POA: Diagnosis not present

## 2022-02-06 DIAGNOSIS — M810 Age-related osteoporosis without current pathological fracture: Secondary | ICD-10-CM | POA: Diagnosis not present

## 2022-02-06 DIAGNOSIS — N1832 Chronic kidney disease, stage 3b: Secondary | ICD-10-CM | POA: Diagnosis not present

## 2022-02-06 DIAGNOSIS — F0390 Unspecified dementia without behavioral disturbance: Secondary | ICD-10-CM | POA: Diagnosis not present

## 2022-02-11 ENCOUNTER — Other Ambulatory Visit: Payer: Self-pay | Admitting: Family Medicine

## 2022-02-11 NOTE — Progress Notes (Signed)
Referring Physician:  Virginia Crews, [address] [place],  [place] [id number]  Primary Physician:  Virginia Crews, MD  History of Present Illness:  She has history of dementia.   Last seen by me on 01/09/22 for T7 and L1 compression fractures after fall around 12/11/21. Pain was improving at last visit and her xrays were stable.   She is here for follow up.   She has history of dementia.   She is not having any pain in her back. She has a TLSO brace and is not wearing it. No radiation of pain into her arms or legs. No weakness noted.   Daughter states she had pain for a week or so after her initial injury and has done well since.   Review of Systems:  A 10 point review of systems is negative, except for the pertinent positives and negatives detailed in the HPI.  Past Medical History: Past Medical History:  Diagnosis Date   Depression    Environmental allergies    Hypercholesteremia    Hypertension     Past Surgical History: Past Surgical History:  Procedure Laterality Date   ABDOMINAL HYSTERECTOMY     CATARACT EXTRACTION W/PHACO Right 06/12/2015   Procedure: CATARACT EXTRACTION PHACO AND INTRAOCULAR LENS PLACEMENT (IOC);  Surgeon: Leandrew Koyanagi, MD;  Location: [facility];  Service: Ophthalmology;  Laterality: Right;    Allergies: Allergies as of 02/13/2022 - Review Complete 01/09/2022  Allergen Reaction Noted   Naproxen Swelling 11/26/2014   Oxycodone-aspirin Swelling 11/26/2014   Prednisone Diarrhea 06/04/2016    Medications: Outpatient Encounter Medications as of 02/13/2022  Medication Sig   acetaminophen (TYLENOL) 500 MG tablet Take 2 tablets (1,000 mg total) by mouth 3 (three) times daily as needed for mild pain (or Fever >/= 101).   atorvastatin (LIPITOR) 40 MG tablet TAKE 1 TABLET BY MOUTH EVERY DAY   citalopram (CELEXA) 20 MG tablet Take 1 tablet (20 mg total) by mouth daily.   donepezil (ARICEPT) 10 MG tablet  Take 1 tablet (10 mg total) by mouth at bedtime.   HYDROcodone-acetaminophen (NORCO/VICODIN) 5-325 MG tablet Take 0.5 tablets by mouth every 6 (six) hours as needed.   mirtazapine (REMERON) 15 MG tablet TAKE 1 TABLET BY MOUTH EVERYDAY AT BEDTIME   spironolactone (ALDACTONE) 25 MG tablet TAKE 1 TABLET BY MOUTH EVERY DAY   traMADol (ULTRAM) 50 MG tablet Take 50 mg by mouth every 6 (six) hours as needed.   No facility-administered encounter medications on file as of 02/13/2022.    Social History: Social History   Tobacco Use   Smoking status: Never   Smokeless tobacco: Never  Vaping Use   Vaping Use: Never used  Substance Use Topics   Alcohol use: No    Alcohol/week: 0.0 standard drinks of alcohol   Drug use: No    Family Medical History: Family History  Problem Relation Age of Onset   Heart disease Mother        CHF   Hypertension Mother    Cancer Father        prostate   Cancer Sister        breast   Kidney disease Brother    Breast cancer Neg Hx     Physical Examination: There were no vitals filed for this visit.   General: Patient is well developed, well nourished, calm, collected, and in no apparent distress. Attention to examination is appropriate.  Respiratory: Patient is breathing without any difficulty.  NEUROLOGICAL:     She is pleasantly confused. Able to answer questions and follow commands.   Cranial Nerves: Pupils equal round and reactive to light.  Facial tone is symmetric.  Facial sensation is symmetric.  No abnormal lesions on exposed skin.   Strength: Side Biceps Triceps Deltoid Interossei Grip Wrist Ext. Wrist Flex.  R 5 5 5  N/A 5 5 5   L 5 5 5  N/A 5 5 5    Side Iliopsoas Quads Hamstring PF DF EHL  R 5 5 5 5 5 5   L 5 5 5 5 5 5    Clonus is not present.   Bilateral upper and lower extremity sensation is intact to light touch.     Right hand shows flexion contracture of middle/ring/small finger and partial flexion contracture of index  finger.   Left hand shows flexion contracture of small and ring finger.   Medical Decision Making  Imaging: Xrays of thoracic and lumbar spine dated 02/13/22:  Stable compression fractures at T7 and L1.   Xray report not available for my review.   Assessment and Plan: Becky Prince is a pleasant 88 y.o. female with T7 and L1 compression fractures after fall around 12/11/21. She has no back pain. No pain in her arms or legs. No numbness, tingling, or weakness.   Xrays from today show fractures at T7 and L1 are stable.   Treatment options discussed with patient and her daughter. Following plan made:   - Recommend she continue with TLSO brace. Do not wear when sleeping. Discussed importance of wearing brace even though it is uncomfortable.  - No bending, twisting, or lifting.  - Follow up in 4-6 weeks for repeat thoracic and lumbar xrays with flexion/extension.   Geronimo Boot PA-C Dept. of Neurosurgery

## 2022-02-12 ENCOUNTER — Other Ambulatory Visit: Payer: Self-pay

## 2022-02-12 DIAGNOSIS — S32010S Wedge compression fracture of first lumbar vertebra, sequela: Secondary | ICD-10-CM

## 2022-02-12 DIAGNOSIS — S22060D Wedge compression fracture of T7-T8 vertebra, subsequent encounter for fracture with routine healing: Secondary | ICD-10-CM

## 2022-02-13 ENCOUNTER — Ambulatory Visit
Admission: RE | Admit: 2022-02-13 | Discharge: 2022-02-13 | Disposition: A | Discharge status: Home / Self Care | Payer: PPO | Source: Ambulatory Visit | Attending: Orthopedic Surgery | Admitting: Orthopedic Surgery

## 2022-02-13 ENCOUNTER — Ambulatory Visit (INDEPENDENT_AMBULATORY_CARE_PROVIDER_SITE_OTHER): Payer: PPO | Admitting: Orthopedic Surgery

## 2022-02-13 ENCOUNTER — Encounter: Payer: Self-pay | Admitting: Orthopedic Surgery

## 2022-02-13 VITALS — BP 147/76 | HR 77 | Ht 68.0 in | Wt 117.4 lb

## 2022-02-13 DIAGNOSIS — S32010S Wedge compression fracture of first lumbar vertebra, sequela: Secondary | ICD-10-CM | POA: Diagnosis not present

## 2022-02-13 DIAGNOSIS — S22060D Wedge compression fracture of T7-T8 vertebra, subsequent encounter for fracture with routine healing: Secondary | ICD-10-CM

## 2022-02-13 DIAGNOSIS — S32010D Wedge compression fracture of first lumbar vertebra, subsequent encounter for fracture with routine healing: Secondary | ICD-10-CM

## 2022-02-13 DIAGNOSIS — M8588 Other specified disorders of bone density and structure, other site: Secondary | ICD-10-CM | POA: Diagnosis not present

## 2022-02-13 DIAGNOSIS — M546 Pain in thoracic spine: Secondary | ICD-10-CM | POA: Diagnosis not present

## 2022-02-13 DIAGNOSIS — S32010A Wedge compression fracture of first lumbar vertebra, initial encounter for closed fracture: Secondary | ICD-10-CM

## 2022-02-13 DIAGNOSIS — M545 Low back pain, unspecified: Secondary | ICD-10-CM | POA: Diagnosis not present

## 2022-02-20 DIAGNOSIS — M5416 Radiculopathy, lumbar region: Secondary | ICD-10-CM | POA: Diagnosis not present

## 2022-02-20 DIAGNOSIS — M5136 Other intervertebral disc degeneration, lumbar region: Secondary | ICD-10-CM | POA: Diagnosis not present

## 2022-02-20 DIAGNOSIS — M48062 Spinal stenosis, lumbar region with neurogenic claudication: Secondary | ICD-10-CM | POA: Diagnosis not present

## 2022-03-13 ENCOUNTER — Other Ambulatory Visit: Payer: Self-pay | Admitting: Family Medicine

## 2022-03-16 DIAGNOSIS — N1832 Chronic kidney disease, stage 3b: Secondary | ICD-10-CM | POA: Diagnosis not present

## 2022-03-16 DIAGNOSIS — I129 Hypertensive chronic kidney disease with stage 1 through stage 4 chronic kidney disease, or unspecified chronic kidney disease: Secondary | ICD-10-CM | POA: Diagnosis not present

## 2022-03-24 ENCOUNTER — Other Ambulatory Visit: Payer: Self-pay | Admitting: Family Medicine

## 2022-03-31 ENCOUNTER — Ambulatory Visit: Payer: PPO | Admitting: Orthopedic Surgery

## 2022-03-31 ENCOUNTER — Encounter: Payer: Self-pay | Admitting: Family Medicine

## 2022-03-31 ENCOUNTER — Ambulatory Visit (INDEPENDENT_AMBULATORY_CARE_PROVIDER_SITE_OTHER): Payer: PPO | Admitting: Family Medicine

## 2022-03-31 VITALS — BP 135/70 | HR 69 | Temp 98.0°F | Resp 16 | Wt 120.1 lb

## 2022-03-31 DIAGNOSIS — I10 Essential (primary) hypertension: Secondary | ICD-10-CM | POA: Diagnosis not present

## 2022-03-31 DIAGNOSIS — E0590 Thyrotoxicosis, unspecified without thyrotoxic crisis or storm: Secondary | ICD-10-CM | POA: Diagnosis not present

## 2022-03-31 DIAGNOSIS — R636 Underweight: Secondary | ICD-10-CM

## 2022-03-31 DIAGNOSIS — R2689 Other abnormalities of gait and mobility: Secondary | ICD-10-CM

## 2022-03-31 DIAGNOSIS — F331 Major depressive disorder, recurrent, moderate: Secondary | ICD-10-CM

## 2022-03-31 DIAGNOSIS — E7800 Pure hypercholesterolemia, unspecified: Secondary | ICD-10-CM | POA: Diagnosis not present

## 2022-03-31 NOTE — Assessment & Plan Note (Signed)
Reviewed last TSH and free T4 which were within normal limits Will continue to monitor annually

## 2022-03-31 NOTE — Assessment & Plan Note (Signed)
Chronic and stable Continue Remeron to help with appetite

## 2022-03-31 NOTE — Assessment & Plan Note (Signed)
Chronic and well-controlled History of CVA, which is why she is still on secondary prevention Continue statin Recheck FLP annually Goal LDL less than 100

## 2022-03-31 NOTE — Assessment & Plan Note (Signed)
Chronic and well-controlled Continue Celexa and Remeron at current dose

## 2022-03-31 NOTE — Assessment & Plan Note (Addendum)
Well controlled Continue current medications Reviewed metabolic panel F/u in 6 months  

## 2022-03-31 NOTE — Assessment & Plan Note (Signed)
Patient has done home health physical therapy after her last fall and is doing better We did discuss about using her cane or walker in the house and not furniture surfing Will get her an order for a 3 pronged base cane rather than a single-point cane

## 2022-03-31 NOTE — Progress Notes (Signed)
I,Sulibeya S Dimas,acting as a Education administrator for [name], MD.,have documented all relevant documentation on the behalf of [name], MD,as directed by  [name], MD while in the presence of [name], MD.     Established patient visit   Patient: Becky Prince   DOB: [birth year]   88 y.o. Female  MRN: [id number] Visit Date: 03/31/2022  Today's healthcare provider: [name], MD   Chief Complaint  Patient presents with   Hypertension   Hyperlipidemia   Hypothyroidism   Depression   Subjective    HPI  Hypertension, follow-up  BP Readings from Last 3 Encounters:  03/31/22 135/70  02/13/22 (!) 147/76  01/09/22 (!) 165/85   Wt Readings from Last 3 Encounters:  03/31/22 120 lb 1.6 oz (54.5 kg)  02/13/22 117 lb 6.4 oz (53.3 kg)  01/09/22 119 lb 12.8 oz (54.3 kg)     She was last seen for hypertension 6 months ago.  BP at that visit was 155/75. Management since that visit includes no changes. She reports excellent compliance with treatment. She is not having side effects.      Outside blood pressures are not being checked.  Use of agents associated with hypertension: none.   --------------------------------------------------------------------------------------------------- Lipid/Cholesterol, follow-up  Last Lipid Panel: Lab Results  Component Value Date   CHOL 146 09/29/2021   LDLCALC 66 09/29/2021   HDL 60 09/29/2021   TRIG 113 09/29/2021    She was last seen for this 6 months ago.  Management since that visit includes no changes.  She reports excellent compliance with treatment. She is not having side effects.   Last metabolic panel Lab Results  Component Value Date   GLUCOSE 110 (H) 12/17/2021   NA 138 12/17/2021   K 4.3 12/17/2021   BUN 20 12/17/2021   CREATININE 1.22 (H) 12/17/2021   EGFR 34 09/11/2021   GFRNONAA 41 (L) 12/17/2021   CALCIUM 9.3 12/17/2021   AST 17 12/17/2021   ALT 9 12/17/2021   The ASCVD Risk  score (Arnett DK, et al., 2019) failed to calculate for the following reasons:   The 2019 ASCVD risk score is only valid for ages 55 to 68  ---------------------------------------------------------------------------------------------------   Medications: Outpatient Medications Prior to Visit  Medication Sig   atorvastatin (LIPITOR) 40 MG tablet TAKE 1 TABLET BY MOUTH EVERY DAY   citalopram (CELEXA) 20 MG tablet TAKE 1 TABLET BY MOUTH EVERY DAY   donepezil (ARICEPT) 10 MG tablet TAKE 1 TABLET BY MOUTH EVERYDAY AT BEDTIME   HYDROcodone-acetaminophen (NORCO/VICODIN) 5-325 MG tablet Take 0.5 tablets by mouth every 6 (six) hours as needed.   mirtazapine (REMERON) 15 MG tablet TAKE 1 TABLET BY MOUTH EVERYDAY AT BEDTIME   spironolactone (ALDACTONE) 25 MG tablet TAKE 1 TABLET BY MOUTH EVERY DAY   traMADol (ULTRAM) 50 MG tablet Take 50 mg by mouth every 6 (six) hours as needed.   [DISCONTINUED] acetaminophen (TYLENOL) 500 MG tablet Take 2 tablets (1,000 mg total) by mouth 3 (three) times daily as needed for mild pain (or Fever >/= 101). (Patient not taking: Reported on 03/31/2022)   No facility-administered medications prior to visit.    Review of Systems  Constitutional:  Negative for appetite change, chills, fatigue, fever and unexpected weight change.  Eyes:  Negative for visual disturbance.  Respiratory:  Negative for chest tightness and shortness of breath.   Cardiovascular:  Negative for chest pain and leg swelling.  Gastrointestinal:  Negative for abdominal pain, diarrhea, nausea and vomiting.  Endocrine: Positive for cold intolerance and heat intolerance.  Neurological:  Negative for dizziness, light-headedness and headaches.       Objective    BP 135/70 (BP Location: Left Arm, Patient Position: Sitting, Cuff Size: Normal)   Pulse 69   Temp 98 F (36.7 C) (Oral)   Resp 16   Wt 120 lb 1.6 oz (54.5 kg)   BMI 18.26 kg/m  BP Readings from Last 3 Encounters:  03/31/22 135/70   02/13/22 (!) 147/76  01/09/22 (!) 165/85   Wt Readings from Last 3 Encounters:  03/31/22 120 lb 1.6 oz (54.5 kg)  02/13/22 117 lb 6.4 oz (53.3 kg)  01/09/22 119 lb 12.8 oz (54.3 kg)      Physical Exam Vitals reviewed.  Constitutional:      General: She is not in acute distress.    Appearance: Normal appearance. She is well-developed. She is not diaphoretic.  HENT:     Head: Normocephalic and atraumatic.  Eyes:     General: No scleral icterus.    Conjunctiva/sclera: Conjunctivae normal.  Neck:     Thyroid: No thyromegaly.  Cardiovascular:     Rate and Rhythm: Normal rate and regular rhythm.     Pulses: Normal pulses.     Heart sounds: Normal heart sounds. No murmur heard. Pulmonary:     Effort: Pulmonary effort is normal. No respiratory distress.     Breath sounds: Normal breath sounds. No wheezing, rhonchi or rales.  Musculoskeletal:     Cervical back: Neck supple.     Right lower leg: No edema.     Left lower leg: No edema.  Lymphadenopathy:     Cervical: No cervical adenopathy.  Skin:    General: Skin is warm and dry.     Findings: No rash.  Neurological:     Mental Status: She is alert and oriented to person, place, and time. Mental status is at baseline.  Psychiatric:        Mood and Affect: Mood normal.        Behavior: Behavior normal.       No results found for any visits on 03/31/22.  Assessment & Plan     Problem List Items Addressed This Visit       Cardiovascular and Mediastinum   BP (high blood pressure)    Well controlled Continue current medications Reviewed metabolic panel F/u in 6 months         Endocrine   Subclinical hyperthyroidism    Reviewed last TSH and free T4 which were within normal limits Will continue to monitor annually        Other   Hypercholesteremia - Primary    Chronic and well-controlled History of CVA, which is why she is still on secondary prevention Continue statin Recheck FLP annually Goal LDL less  than 100      MDD (major depressive disorder)    Chronic and well-controlled Continue Celexa and Remeron at current dose      Underweight    Chronic and stable Continue Remeron to help with appetite      Imbalance    Patient has done home health physical therapy after her last fall and is doing better We did discuss about using her cane or walker in the house and not furniture surfing Will get her an order for a 3 pronged base cane rather than a single-point cane      Relevant Orders   For home use only DME [employer]  Return in about 6 months (around 09/30/2022) for CPE, AWV.      I, [name], MD, have reviewed all documentation for this visit. The documentation on 03/31/22 for the exam, diagnosis, procedures, and orders are all accurate and complete.   Malakie Balis, [name], MD, MPH [facility]

## 2022-04-10 DIAGNOSIS — Z681 Body mass index (BMI) 19 or less, adult: Secondary | ICD-10-CM | POA: Diagnosis not present

## 2022-04-10 DIAGNOSIS — E785 Hyperlipidemia, unspecified: Secondary | ICD-10-CM | POA: Diagnosis not present

## 2022-04-10 DIAGNOSIS — E441 Mild protein-calorie malnutrition: Secondary | ICD-10-CM | POA: Diagnosis not present

## 2022-04-13 ENCOUNTER — Ambulatory Visit: Payer: Self-pay | Admitting: *Deleted

## 2022-04-13 ENCOUNTER — Ambulatory Visit (INDEPENDENT_AMBULATORY_CARE_PROVIDER_SITE_OTHER): Payer: PPO | Admitting: Family Medicine

## 2022-04-13 ENCOUNTER — Encounter: Payer: Self-pay | Admitting: Family Medicine

## 2022-04-13 VITALS — BP 132/68 | HR 76 | Resp 18 | Wt 114.6 lb

## 2022-04-13 DIAGNOSIS — R636 Underweight: Secondary | ICD-10-CM

## 2022-04-13 DIAGNOSIS — R4182 Altered mental status, unspecified: Secondary | ICD-10-CM

## 2022-04-13 DIAGNOSIS — F331 Major depressive disorder, recurrent, moderate: Secondary | ICD-10-CM

## 2022-04-13 DIAGNOSIS — R2689 Other abnormalities of gait and mobility: Secondary | ICD-10-CM | POA: Diagnosis not present

## 2022-04-13 DIAGNOSIS — F0150 Vascular dementia without behavioral disturbance: Secondary | ICD-10-CM

## 2022-04-13 DIAGNOSIS — F0280 Dementia in other diseases classified elsewhere without behavioral disturbance: Secondary | ICD-10-CM | POA: Diagnosis not present

## 2022-04-13 DIAGNOSIS — G459 Transient cerebral ischemic attack, unspecified: Secondary | ICD-10-CM

## 2022-04-13 DIAGNOSIS — I679 Cerebrovascular disease, unspecified: Secondary | ICD-10-CM | POA: Diagnosis not present

## 2022-04-13 DIAGNOSIS — E0590 Thyrotoxicosis, unspecified without thyrotoxic crisis or storm: Secondary | ICD-10-CM

## 2022-04-13 DIAGNOSIS — G309 Alzheimer's disease, unspecified: Secondary | ICD-10-CM

## 2022-04-13 DIAGNOSIS — R296 Repeated falls: Secondary | ICD-10-CM

## 2022-04-13 LAB — POCT URINALYSIS DIPSTICK
Bilirubin, UA: NEGATIVE
Blood, UA: POSITIVE
Glucose, UA: NEGATIVE
Ketones, UA: NEGATIVE
Nitrite, UA: NEGATIVE
Protein, UA: POSITIVE — AB
Spec Grav, UA: 1.015 (ref 1.010–1.025)
Urobilinogen, UA: 0.2 E.U./dL
pH, UA: 6 (ref 5.0–8.0)

## 2022-04-13 MED ORDER — MIRTAZAPINE 15 MG PO TABS: 15.0000 mg | ORAL_TABLET | Freq: Every day | ORAL | 0 refills | Status: DC

## 2022-04-13 MED ORDER — CEPHALEXIN 500 MG PO CAPS: 500.0000 mg | ORAL_CAPSULE | Freq: Four times a day (QID) | ORAL | 0 refills | Status: AC

## 2022-04-13 MED ORDER — MIRTAZAPINE 30 MG PO TABS: 30.0000 mg | ORAL_TABLET | Freq: Every day | ORAL | 0 refills | Status: DC

## 2022-04-13 NOTE — Telephone Encounter (Signed)
  Chief Complaint: Confusion Symptoms: Daughter Helene Kelp calling. States pt with increased confusion, "Brain fog." [place] one week ago, "Didn't hit her head." Fall was not witnessed.  Frequency: 1 week Pertinent Negatives: Patient denies "Has not complained about anything." No new meds.  Disposition: [] ED /[] Urgent Care (no appt availability in office) / [x] Appointment(In office/virtual)/ []  [facility]/ [] Home Care/ [] Refused Recommended Disposition /[]  Mobile Bus/ []  Follow-up with PCP Additional Notes: Daughter has not seen pt in a while, lives out of town. States Pt seems "Different." Pt lives independently.  Appt secured for this AM. Reason for Disposition  [1] Longstanding confusion (e.g., dementia, stroke) AND [2] worsening  Answer Assessment - Initial Assessment Questions 1. LEVEL OF CONSCIOUSNESS: "How is he (she, the patient) acting right now?" (e.g., alert-oriented, confused, lethargic, stuporous, comatose)     Confused at times 2. ONSET: "When did the confusion start?"  (minutes, hours, days)     1 week ago 3. PATTERN "Does this come and go, or has it been constant since it started?"  "Is it present now?"     Comes and goes 4. ALCOHOL or DRUGS: "Has he been drinking alcohol or taking any drugs?"      No 5. NARCOTIC MEDICINES: "Has he been receiving any narcotic medications?" (e.g., morphine, Vicodin)     No 6. CAUSE: "What do you think is causing the confusion?"      "Maybe UTI." 7. OTHER SYMPTOMS: "Are there any other symptoms?" (e.g., difficulty breathing, headache, fever, weakness) Headache, back pain yesterday. [place] one week ago, "Didn't hit her head." Fall was not witnessed.  Protocols used: Confusion - Delirium-A-AH

## 2022-04-13 NOTE — Addendum Note (Signed)
Addended by: Julieta Bellini on: 04/13/2022 01:20 PM   Modules accepted: Orders

## 2022-04-13 NOTE — Progress Notes (Signed)
   SUBJECTIVE:   CHIEF COMPLAINT / HPI:   Fall, confusion - fell at home 1 week ago while in basement. States she "misstepped" on floor, denies any loose cords or rugs or tripping over object. Daughter states she has h/o falls and imbalance with prior TIAs. Fall was unwitnessed. Did not hit head or body part when fell. Denies subsequent pain. - daughter has noticed some change in her mood and increased confusion over the last week. Denies dysuria, urinary frequency, pain, bowel changes. Has been urinating 3-4 times per night. Has been eating normally though is less hungry than usual. Some missed doses of medications though no more than once every couple weeks. Has been feeling more tired and depressed recently.  - has pill box at home, daughter manages. - patient lives alone, daughter lives in [place] but visits and calls often.  - ambulates with cane though doesn't often use at home. Has stairs in home but uses chair lift. - husband passed away 4 years ago.    OBJECTIVE:   BP 132/68 (BP Location: Right Arm, Patient Position: Sitting, Cuff Size: Normal)   Pulse 76   Resp 18   Wt 114 lb 9.6 oz (52 kg)   SpO2 90%   BMI 17.42 kg/m   Gen: elderly, well appearing, in NAD Card: RRR Lungs: CTAB Ext: WWP, no edema     04/13/2022   11:30 AM 03/31/2022    9:46 AM 09/29/2021    9:40 AM  Depression screen PHQ 2/9  Decreased Interest 3 0 0  Down, Depressed, Hopeless 3 0 1  PHQ - 2 Score 6 0 1  Altered sleeping 2 0 0  Tired, decreased energy 2 0 1  Change in appetite 1 1 0  Feeling bad or failure about yourself  1 0 0  Trouble concentrating 1 0 0  Moving slowly or fidgety/restless 1 0 1  Suicidal thoughts 0 0 0  PHQ-9 Score 14 1 3   Difficult doing work/chores Very difficult Not difficult at all Not difficult at all     ASSESSMENT/PLAN:   Fall, confusion Likely multifactorial. H/o imbalance 2/2 prior TIA and cerebrovascular disease and ?worsened dementia, also with increased  confusion and UA c/w possible UTI. Will treat for UTI with keflex and send for culture, adjust regimen as indicated. Also counseled on cane usage in the home, recommend obtaining tripod cane for better balance. HH PT and nursing ordered today given lives alone, difficulty getting out of the house for strength training, home adaptation for safety and medication reconciliation. PHQ elevated today, may also be contributing however given possible UTI, will treat for infection and obtain labs prior to adjusting regimen given previously stable on current regimen.  F/u in 1 month for recheck.    Myles Gip, DO

## 2022-04-15 LAB — URINALYSIS, ROUTINE W REFLEX MICROSCOPIC
Bilirubin, UA: NEGATIVE
Glucose, UA: NEGATIVE
Ketones, UA: NEGATIVE
Nitrite, UA: NEGATIVE
Specific Gravity, UA: 1.017 (ref 1.005–1.030)
Urobilinogen, Ur: 0.2 mg/dL (ref 0.2–1.0)
pH, UA: 5.5 (ref 5.0–7.5)

## 2022-04-15 LAB — URINE CULTURE

## 2022-04-15 LAB — MICROSCOPIC EXAMINATION
Epithelial Cells (non renal): NONE SEEN /hpf (ref 0–10)
RBC, Urine: NONE SEEN /hpf (ref 0–2)
WBC, UA: >30 /hpf — AB (ref 0–5)

## 2022-04-16 ENCOUNTER — Encounter: Payer: Self-pay | Admitting: *Deleted

## 2022-04-16 LAB — THYROID PANEL WITH TSH
Free Thyroxine Index: 2.4 (ref 1.2–4.9)
T3 Uptake Ratio: 29 % (ref 24–39)
T4, Total: 8.2 ug/dL (ref 4.5–12.0)
TSH: 0.444 u[IU]/mL — ABNORMAL LOW (ref 0.450–4.500)

## 2022-04-16 LAB — CBC WITH DIFFERENTIAL/PLATELET
Basophils Absolute: 0.1 10*3/uL (ref 0.0–0.2)
Basos: 1 %
EOS (ABSOLUTE): 0.1 10*3/uL (ref 0.0–0.4)
Eos: 1 %
Hematocrit: 44.8 % (ref 34.0–46.6)
Hemoglobin: 14.5 g/dL (ref 11.1–15.9)
Immature Grans (Abs): 0 10*3/uL (ref 0.0–0.1)
Immature Granulocytes: 0 %
Lymphocytes Absolute: 2.1 10*3/uL (ref 0.7–3.1)
Lymphs: 24 %
MCH: 30.5 pg (ref 26.6–33.0)
MCHC: 32.4 g/dL (ref 31.5–35.7)
MCV: 94 fL (ref 79–97)
Monocytes Absolute: 0.6 10*3/uL (ref 0.1–0.9)
Monocytes: 7 %
Neutrophils Absolute: 6.1 10*3/uL (ref 1.4–7.0)
Neutrophils: 67 %
Platelets: 252 10*3/uL (ref 150–450)
RBC: 4.75 x10E6/uL (ref 3.77–5.28)
RDW: 11.5 % — ABNORMAL LOW (ref 11.7–15.4)
WBC: 9.1 10*3/uL (ref 3.4–10.8)

## 2022-04-16 LAB — COMPREHENSIVE METABOLIC PANEL
ALT: 9 IU/L (ref 0–32)
AST: 17 IU/L (ref 0–40)
Albumin/Globulin Ratio: 1.5 (ref 1.2–2.2)
Albumin: 4.2 g/dL (ref 3.6–4.6)
Alkaline Phosphatase: 125 IU/L — ABNORMAL HIGH (ref 44–121)
BUN/Creatinine Ratio: 18 (ref 12–28)
BUN: 26 mg/dL (ref 10–36)
Bilirubin Total: 0.4 mg/dL (ref 0.0–1.2)
CO2: 25 mmol/L (ref 20–29)
Calcium: 9.4 mg/dL (ref 8.7–10.3)
Chloride: 101 mmol/L (ref 96–106)
Creatinine, Ser: 1.43 mg/dL — ABNORMAL HIGH (ref 0.57–1.00)
Globulin, Total: 2.8 g/dL (ref 1.5–4.5)
Glucose: 69 mg/dL — ABNORMAL LOW (ref 70–99)
Potassium: 5 mmol/L (ref 3.5–5.2)
Sodium: 140 mmol/L (ref 134–144)
Total Protein: 7 g/dL (ref 6.0–8.5)
eGFR: 34 mL/min/{1.73_m2} — ABNORMAL LOW (ref >59)

## 2022-04-19 ENCOUNTER — Other Ambulatory Visit: Payer: Self-pay | Admitting: Family Medicine

## 2022-04-29 ENCOUNTER — Telehealth: Payer: Self-pay | Admitting: Family Medicine

## 2022-04-29 ENCOUNTER — Other Ambulatory Visit: Payer: Self-pay | Admitting: Family Medicine

## 2022-04-29 NOTE — Telephone Encounter (Signed)
  Helene Kelp, patient's daughter called to inquire about other home health care agencies. Patient was recently referred to [facility] for home health services, but Helene Kelp had concerns about a Freight forwarder there and wanted to know if there were any alternatives for home health care. Advised patient's daughter that another referral would have to be done in order to change original order from [facility] home [facility]. Helene Kelp states that she will continue will [facility] at this time, but if she has additional problems with the services she would like to change. Patient will discuss further at future OV, 05/05/21.  Helene Kelp also states that patient would like a urine test to make sure UTI is gone when she comes to next OV 05/05/21.

## 2022-04-30 ENCOUNTER — Other Ambulatory Visit: Payer: Self-pay | Admitting: Family Medicine

## 2022-04-30 MED ORDER — MIRTAZAPINE 15 MG PO TABS: 15.0000 mg | ORAL_TABLET | Freq: Every day | ORAL | 0 refills | Status: DC

## 2022-04-30 NOTE — Telephone Encounter (Signed)
Noted  

## 2022-04-30 NOTE — Telephone Encounter (Signed)
Attempted to contact pharmacy to verify if patient picked up Rx refill. Automated system not currently working, need to try again later per recording

## 2022-04-30 NOTE — Telephone Encounter (Signed)
Called pharmacy again and staff reports patient's daughter has picked up medication.

## 2022-04-30 NOTE — Telephone Encounter (Signed)
Requested by interface surescripts. Duplicate request. Patient already picked up Rx per pharmacy staff. Requested Prescriptions  Refused Prescriptions Disp Refills   mirtazapine (REMERON) 15 MG tablet [Pharmacy Med Name: MIRTAZAPINE 15 MG TABLET] 90 tablet 0    Sig: TAKE 1 TABLET BY MOUTH EVERYDAY AT BEDTIME     Psychiatry: Antidepressants - mirtazapine Passed - 04/29/2022  8:18 PM      Passed - Completed PHQ-2 or PHQ-9 in the last 360 days      Passed - Valid encounter within last 6 months    Recent Outpatient Visits           2 weeks ago Altered mental status, unspecified altered mental status type   [place], DO   1 month ago [place], [name], MD   7 months ago Encounter for annual wellness visit (AWV) in Medicare patient   [facility] [place], [name], MD   1 year ago Mixed dementia [facility])   [place], [name], MD   1 year ago Encounter for annual physical exam   [facility] Bacigalupo, [name], MD       Future Appointments             In 5 days Bacigalupo, [name], MD [facility], [place]

## 2022-04-30 NOTE — Telephone Encounter (Signed)
Daughter teresa calling back to follow up on refill request for mirtazapine (REMERON) 15 MG tablet .  It is not at the pharmacy.  It looks like it was "printed". Can you resend?

## 2022-04-30 NOTE — Telephone Encounter (Signed)
Resending as originally ordered on 04/13/22.  Requested Prescriptions  Pending Prescriptions Disp Refills   mirtazapine (REMERON) 15 MG tablet 90 tablet 0    Sig: Take 1 tablet (15 mg total) by mouth at bedtime.     Psychiatry: Antidepressants - mirtazapine Passed - 04/30/2022  1:59 PM      Passed - Completed PHQ-2 or PHQ-9 in the last 360 days      Passed - Valid encounter within last 6 months    Recent Outpatient Visits           2 weeks ago Altered mental status, unspecified altered mental status type   [facility] Myles Gip, DO   1 month ago [place], [name], MD   7 months ago Encounter for annual wellness visit (AWV) in Medicare patient   [facility], [name], MD   1 year ago Mixed dementia [facility])   [facility], [name], MD   1 year ago Encounter for annual physical exam   [facility] Bacigalupo, [name], MD       Future Appointments             In 5 days Bacigalupo, [name], MD [facility], [place]

## 2022-05-01 DIAGNOSIS — Z9181 History of falling: Secondary | ICD-10-CM | POA: Diagnosis not present

## 2022-05-01 DIAGNOSIS — I69398 Other sequelae of cerebral infarction: Secondary | ICD-10-CM | POA: Diagnosis not present

## 2022-05-01 DIAGNOSIS — E038 Other specified hypothyroidism: Secondary | ICD-10-CM | POA: Diagnosis not present

## 2022-05-01 DIAGNOSIS — E042 Nontoxic multinodular goiter: Secondary | ICD-10-CM | POA: Diagnosis not present

## 2022-05-01 DIAGNOSIS — E7800 Pure hypercholesterolemia, unspecified: Secondary | ICD-10-CM | POA: Diagnosis not present

## 2022-05-01 DIAGNOSIS — M24541 Contracture, right hand: Secondary | ICD-10-CM | POA: Diagnosis not present

## 2022-05-01 DIAGNOSIS — M810 Age-related osteoporosis without current pathological fracture: Secondary | ICD-10-CM | POA: Diagnosis not present

## 2022-05-01 DIAGNOSIS — R2689 Other abnormalities of gait and mobility: Secondary | ICD-10-CM | POA: Diagnosis not present

## 2022-05-01 DIAGNOSIS — F331 Major depressive disorder, recurrent, moderate: Secondary | ICD-10-CM | POA: Diagnosis not present

## 2022-05-01 DIAGNOSIS — I129 Hypertensive chronic kidney disease with stage 1 through stage 4 chronic kidney disease, or unspecified chronic kidney disease: Secondary | ICD-10-CM | POA: Diagnosis not present

## 2022-05-01 DIAGNOSIS — N390 Urinary tract infection, site not specified: Secondary | ICD-10-CM | POA: Diagnosis not present

## 2022-05-01 DIAGNOSIS — J302 Other seasonal allergic rhinitis: Secondary | ICD-10-CM | POA: Diagnosis not present

## 2022-05-01 DIAGNOSIS — F0283 Dementia in other diseases classified elsewhere, unspecified severity, with mood disturbance: Secondary | ICD-10-CM | POA: Diagnosis not present

## 2022-05-01 DIAGNOSIS — N1832 Chronic kidney disease, stage 3b: Secondary | ICD-10-CM | POA: Diagnosis not present

## 2022-05-01 DIAGNOSIS — M24542 Contracture, left hand: Secondary | ICD-10-CM | POA: Diagnosis not present

## 2022-05-01 NOTE — Telephone Encounter (Signed)
Tabitha from [facility] is calling notify that they are moving the patients start of care to today.    [phone] direct phone number)

## 2022-05-04 NOTE — Telephone Encounter (Signed)
Noted  

## 2022-05-05 ENCOUNTER — Ambulatory Visit (INDEPENDENT_AMBULATORY_CARE_PROVIDER_SITE_OTHER): Payer: PPO | Admitting: Family Medicine

## 2022-05-05 ENCOUNTER — Encounter: Payer: Self-pay | Admitting: Family Medicine

## 2022-05-05 VITALS — BP 142/73 | HR 80 | Temp 98.3°F | Resp 16 | Wt 121.5 lb

## 2022-05-05 DIAGNOSIS — W19XXXD Unspecified fall, subsequent encounter: Secondary | ICD-10-CM

## 2022-05-05 DIAGNOSIS — K5900 Constipation, unspecified: Secondary | ICD-10-CM

## 2022-05-05 DIAGNOSIS — Z87440 Personal history of urinary (tract) infections: Secondary | ICD-10-CM | POA: Diagnosis not present

## 2022-05-05 LAB — POCT URINALYSIS DIPSTICK
Bilirubin, UA: NEGATIVE
Blood, UA: NEGATIVE
Glucose, UA: NEGATIVE
Ketones, UA: NEGATIVE
Leukocytes, UA: NEGATIVE
Nitrite, UA: NEGATIVE
Protein, UA: POSITIVE — AB
Spec Grav, UA: 1.01 (ref 1.010–1.025)
Urobilinogen, UA: 0.2 E.U./dL
pH, UA: 7 (ref 5.0–8.0)

## 2022-05-05 NOTE — Assessment & Plan Note (Signed)
Chronic problem Likely worsened by some other medications, including opiates She previously found a stool softener to be helpful and she can continue to use this as needed Encourage fiber intake Trial of MiraLAX Start with 1 cap full daily and titrate to 1 soft bowel movement daily

## 2022-05-05 NOTE — Progress Notes (Signed)
Established Patient Office Visit  Subjective   Patient ID: Becky Prince, female    DOB: 1925/12/12  Age: 89 y.o. MRN: [id number]  Chief Complaint  Patient presents with   Follow-up    HPI Present with her daughter today who provides some history.  She is back to baseline mentally now.  Saw Dr Ky Barban on 04/13/22 for fall, AMS, UTI. Was treated with Keflex. UCx grew pan sensitive E coli.  She has taken all of the abx.  She denies any symptoms, such as dysuria or urinary frequency.  Want to discuss alternative to [facility] - initial interaction was poor, but the nurse and the PT that are coming to the home are wonderful and they exclusively will be working with her  Has issues with hard stools chronically.     ROS Per HPI    Objective:     BP (!) 142/73 (BP Location: Right Arm, Patient Position: Sitting, Cuff Size: Normal)   Pulse 80   Temp 98.3 F (36.8 C) (Temporal)   Resp 16   Wt 121 lb 8 oz (55.1 kg)   SpO2 97%   BMI 18.47 kg/m    Physical Exam Vitals reviewed.  Constitutional:      General: She is not in acute distress.    Appearance: She is well-developed.  HENT:     Head: Normocephalic and atraumatic.  Eyes:     General: No scleral icterus.    Conjunctiva/sclera: Conjunctivae normal.  Cardiovascular:     Rate and Rhythm: Normal rate and regular rhythm.  Pulmonary:     Effort: Pulmonary effort is normal. No respiratory distress.  Skin:    General: Skin is warm and dry.     Findings: No rash.  Neurological:     Mental Status: She is alert and oriented to person, place, and time.  Psychiatric:        Behavior: Behavior normal.      Results for orders placed or performed in visit on 05/05/22  POCT urinalysis dipstick  Result Value Ref Range   Color, UA yellow    Clarity, UA clear    Glucose, UA Negative Negative   Bilirubin, UA Negative    Ketones, UA Negative    Spec Grav, UA 1.010 1.010 - 1.025   Blood, UA Negative    pH, UA 7.0 5.0 -  8.0   Protein, UA Positive (A) Negative   Urobilinogen, UA 0.2 0.2 or 1.0 E.U./dL   Nitrite, UA Negative    Leukocytes, UA Negative Negative      The ASCVD Risk score (Arnett DK, et al., 2019) failed to calculate for the following reasons:   The 2019 ASCVD risk score is only valid for ages 50 to 48    Assessment & Plan:   Problem List Items Addressed This Visit       Other   Constipation    Chronic problem Likely worsened by some other medications, including opiates She previously found a stool softener to be helpful and she can continue to use this as needed Encourage fiber intake Trial of MiraLAX Start with 1 cap full daily and titrate to 1 soft bowel movement daily      Other Visit Diagnoses     History of UTI    -  Primary   Relevant Orders   POCT urinalysis dipstick (Completed)   Fall, subsequent encounter          UTI has resolved She is asymptomatic and UA  is clear today We discussed that we do not do screening UAs at every visit, but they do know what signs to watch for and what would need a UA in the future Continue working with home health PT and RN after fall No injuries Also encouraged them to work with health team advantage care management for any other needs such as transportation to appointments Daughter will reach out to her insurance  Return in about 5 months (around 10/04/2022) for CPE, AWV.    [name], MD

## 2022-05-08 DIAGNOSIS — M48062 Spinal stenosis, lumbar region with neurogenic claudication: Secondary | ICD-10-CM | POA: Diagnosis not present

## 2022-05-08 DIAGNOSIS — M5416 Radiculopathy, lumbar region: Secondary | ICD-10-CM | POA: Diagnosis not present

## 2022-05-21 DIAGNOSIS — Z79899 Other long term (current) drug therapy: Secondary | ICD-10-CM | POA: Diagnosis not present

## 2022-05-21 DIAGNOSIS — M5416 Radiculopathy, lumbar region: Secondary | ICD-10-CM | POA: Diagnosis not present

## 2022-05-22 DIAGNOSIS — I69398 Other sequelae of cerebral infarction: Secondary | ICD-10-CM | POA: Diagnosis not present

## 2022-05-22 DIAGNOSIS — M810 Age-related osteoporosis without current pathological fracture: Secondary | ICD-10-CM | POA: Diagnosis not present

## 2022-05-22 DIAGNOSIS — M24542 Contracture, left hand: Secondary | ICD-10-CM | POA: Diagnosis not present

## 2022-05-22 DIAGNOSIS — F0283 Dementia in other diseases classified elsewhere, unspecified severity, with mood disturbance: Secondary | ICD-10-CM | POA: Diagnosis not present

## 2022-05-22 DIAGNOSIS — E042 Nontoxic multinodular goiter: Secondary | ICD-10-CM | POA: Diagnosis not present

## 2022-05-22 DIAGNOSIS — N1832 Chronic kidney disease, stage 3b: Secondary | ICD-10-CM | POA: Diagnosis not present

## 2022-05-22 DIAGNOSIS — Z9181 History of falling: Secondary | ICD-10-CM | POA: Diagnosis not present

## 2022-05-22 DIAGNOSIS — R2689 Other abnormalities of gait and mobility: Secondary | ICD-10-CM | POA: Diagnosis not present

## 2022-05-22 DIAGNOSIS — E7800 Pure hypercholesterolemia, unspecified: Secondary | ICD-10-CM | POA: Diagnosis not present

## 2022-05-22 DIAGNOSIS — N390 Urinary tract infection, site not specified: Secondary | ICD-10-CM | POA: Diagnosis not present

## 2022-05-22 DIAGNOSIS — F331 Major depressive disorder, recurrent, moderate: Secondary | ICD-10-CM | POA: Diagnosis not present

## 2022-05-22 DIAGNOSIS — E038 Other specified hypothyroidism: Secondary | ICD-10-CM | POA: Diagnosis not present

## 2022-05-22 DIAGNOSIS — I129 Hypertensive chronic kidney disease with stage 1 through stage 4 chronic kidney disease, or unspecified chronic kidney disease: Secondary | ICD-10-CM | POA: Diagnosis not present

## 2022-05-22 DIAGNOSIS — J302 Other seasonal allergic rhinitis: Secondary | ICD-10-CM | POA: Diagnosis not present

## 2022-05-22 DIAGNOSIS — M24541 Contracture, right hand: Secondary | ICD-10-CM | POA: Diagnosis not present

## 2022-05-30 DIAGNOSIS — Z9181 History of falling: Secondary | ICD-10-CM | POA: Diagnosis not present

## 2022-05-30 DIAGNOSIS — M24541 Contracture, right hand: Secondary | ICD-10-CM | POA: Diagnosis not present

## 2022-05-30 DIAGNOSIS — N390 Urinary tract infection, site not specified: Secondary | ICD-10-CM | POA: Diagnosis not present

## 2022-05-30 DIAGNOSIS — N1832 Chronic kidney disease, stage 3b: Secondary | ICD-10-CM | POA: Diagnosis not present

## 2022-05-30 DIAGNOSIS — J302 Other seasonal allergic rhinitis: Secondary | ICD-10-CM | POA: Diagnosis not present

## 2022-05-30 DIAGNOSIS — E7800 Pure hypercholesterolemia, unspecified: Secondary | ICD-10-CM | POA: Diagnosis not present

## 2022-05-30 DIAGNOSIS — F0283 Dementia in other diseases classified elsewhere, unspecified severity, with mood disturbance: Secondary | ICD-10-CM | POA: Diagnosis not present

## 2022-05-30 DIAGNOSIS — I129 Hypertensive chronic kidney disease with stage 1 through stage 4 chronic kidney disease, or unspecified chronic kidney disease: Secondary | ICD-10-CM | POA: Diagnosis not present

## 2022-05-30 DIAGNOSIS — R2689 Other abnormalities of gait and mobility: Secondary | ICD-10-CM | POA: Diagnosis not present

## 2022-05-30 DIAGNOSIS — E038 Other specified hypothyroidism: Secondary | ICD-10-CM | POA: Diagnosis not present

## 2022-05-30 DIAGNOSIS — E042 Nontoxic multinodular goiter: Secondary | ICD-10-CM | POA: Diagnosis not present

## 2022-05-30 DIAGNOSIS — I69398 Other sequelae of cerebral infarction: Secondary | ICD-10-CM | POA: Diagnosis not present

## 2022-05-30 DIAGNOSIS — F331 Major depressive disorder, recurrent, moderate: Secondary | ICD-10-CM | POA: Diagnosis not present

## 2022-05-30 DIAGNOSIS — M24542 Contracture, left hand: Secondary | ICD-10-CM | POA: Diagnosis not present

## 2022-05-30 DIAGNOSIS — M810 Age-related osteoporosis without current pathological fracture: Secondary | ICD-10-CM | POA: Diagnosis not present

## 2022-06-01 NOTE — Telephone Encounter (Signed)
Ok great. I'll look for the paperwork to come through. Can you let the daughter know that it is getting fixed please?

## 2022-06-01 NOTE — Telephone Encounter (Signed)
Can we call [facility] and see if they are willing to do weekly nurse visits instead of every other week

## 2022-06-01 NOTE — Telephone Encounter (Addendum)
Pts care was cut back to every other week from seeing a nurse every Friday and [facility] didn't notify the pt or anyone / the pt waited all day for them on Friday and was upset and confused / pts daughter would like to speak with Dr. Brita Romp about this / please advise asap / they dont think this is sufficient care and would like Dr. B to call [facility] so that the pt can continue for the full 8 weeks 1 x a week / Pt is 89 yrs old and alone but needs an LPN or RN to at least check on her once a week / pts daughter called and she is in [place] the country so it will be hard to get in touch with her / please respond to this TE or leave a CRM so when she calls back later or tomorrow someone can advise her on what Dr. B says

## 2022-06-01 NOTE — Telephone Encounter (Signed)
Patient's daughter advised.  

## 2022-06-12 DIAGNOSIS — M24541 Contracture, right hand: Secondary | ICD-10-CM | POA: Diagnosis not present

## 2022-06-12 DIAGNOSIS — N390 Urinary tract infection, site not specified: Secondary | ICD-10-CM | POA: Diagnosis not present

## 2022-06-12 DIAGNOSIS — E038 Other specified hypothyroidism: Secondary | ICD-10-CM | POA: Diagnosis not present

## 2022-06-12 DIAGNOSIS — F0283 Dementia in other diseases classified elsewhere, unspecified severity, with mood disturbance: Secondary | ICD-10-CM | POA: Diagnosis not present

## 2022-06-12 DIAGNOSIS — J302 Other seasonal allergic rhinitis: Secondary | ICD-10-CM | POA: Diagnosis not present

## 2022-06-12 DIAGNOSIS — R2689 Other abnormalities of gait and mobility: Secondary | ICD-10-CM | POA: Diagnosis not present

## 2022-06-12 DIAGNOSIS — N1832 Chronic kidney disease, stage 3b: Secondary | ICD-10-CM | POA: Diagnosis not present

## 2022-06-12 DIAGNOSIS — E042 Nontoxic multinodular goiter: Secondary | ICD-10-CM | POA: Diagnosis not present

## 2022-06-12 DIAGNOSIS — I69398 Other sequelae of cerebral infarction: Secondary | ICD-10-CM | POA: Diagnosis not present

## 2022-06-12 DIAGNOSIS — I129 Hypertensive chronic kidney disease with stage 1 through stage 4 chronic kidney disease, or unspecified chronic kidney disease: Secondary | ICD-10-CM | POA: Diagnosis not present

## 2022-06-12 DIAGNOSIS — F331 Major depressive disorder, recurrent, moderate: Secondary | ICD-10-CM | POA: Diagnosis not present

## 2022-06-12 DIAGNOSIS — Z9181 History of falling: Secondary | ICD-10-CM | POA: Diagnosis not present

## 2022-06-12 DIAGNOSIS — E7800 Pure hypercholesterolemia, unspecified: Secondary | ICD-10-CM | POA: Diagnosis not present

## 2022-06-12 DIAGNOSIS — M810 Age-related osteoporosis without current pathological fracture: Secondary | ICD-10-CM | POA: Diagnosis not present

## 2022-06-12 DIAGNOSIS — M24542 Contracture, left hand: Secondary | ICD-10-CM | POA: Diagnosis not present

## 2022-06-20 ENCOUNTER — Other Ambulatory Visit: Payer: Self-pay | Admitting: Family Medicine

## 2022-06-26 DIAGNOSIS — R2689 Other abnormalities of gait and mobility: Secondary | ICD-10-CM | POA: Diagnosis not present

## 2022-06-26 DIAGNOSIS — F331 Major depressive disorder, recurrent, moderate: Secondary | ICD-10-CM | POA: Diagnosis not present

## 2022-06-26 DIAGNOSIS — I129 Hypertensive chronic kidney disease with stage 1 through stage 4 chronic kidney disease, or unspecified chronic kidney disease: Secondary | ICD-10-CM | POA: Diagnosis not present

## 2022-06-26 DIAGNOSIS — E042 Nontoxic multinodular goiter: Secondary | ICD-10-CM | POA: Diagnosis not present

## 2022-06-26 DIAGNOSIS — Z9181 History of falling: Secondary | ICD-10-CM | POA: Diagnosis not present

## 2022-06-26 DIAGNOSIS — I69398 Other sequelae of cerebral infarction: Secondary | ICD-10-CM | POA: Diagnosis not present

## 2022-06-26 DIAGNOSIS — J302 Other seasonal allergic rhinitis: Secondary | ICD-10-CM | POA: Diagnosis not present

## 2022-06-26 DIAGNOSIS — F0283 Dementia in other diseases classified elsewhere, unspecified severity, with mood disturbance: Secondary | ICD-10-CM | POA: Diagnosis not present

## 2022-06-26 DIAGNOSIS — N390 Urinary tract infection, site not specified: Secondary | ICD-10-CM | POA: Diagnosis not present

## 2022-06-26 DIAGNOSIS — E7800 Pure hypercholesterolemia, unspecified: Secondary | ICD-10-CM | POA: Diagnosis not present

## 2022-06-26 DIAGNOSIS — M24542 Contracture, left hand: Secondary | ICD-10-CM | POA: Diagnosis not present

## 2022-06-26 DIAGNOSIS — M810 Age-related osteoporosis without current pathological fracture: Secondary | ICD-10-CM | POA: Diagnosis not present

## 2022-06-26 DIAGNOSIS — E038 Other specified hypothyroidism: Secondary | ICD-10-CM | POA: Diagnosis not present

## 2022-06-26 DIAGNOSIS — M24541 Contracture, right hand: Secondary | ICD-10-CM | POA: Diagnosis not present

## 2022-06-26 DIAGNOSIS — N1832 Chronic kidney disease, stage 3b: Secondary | ICD-10-CM | POA: Diagnosis not present

## 2022-06-30 DIAGNOSIS — E7800 Pure hypercholesterolemia, unspecified: Secondary | ICD-10-CM | POA: Diagnosis not present

## 2022-06-30 DIAGNOSIS — M810 Age-related osteoporosis without current pathological fracture: Secondary | ICD-10-CM | POA: Diagnosis not present

## 2022-06-30 DIAGNOSIS — E042 Nontoxic multinodular goiter: Secondary | ICD-10-CM | POA: Diagnosis not present

## 2022-06-30 DIAGNOSIS — M24541 Contracture, right hand: Secondary | ICD-10-CM | POA: Diagnosis not present

## 2022-06-30 DIAGNOSIS — N390 Urinary tract infection, site not specified: Secondary | ICD-10-CM | POA: Diagnosis not present

## 2022-06-30 DIAGNOSIS — I69398 Other sequelae of cerebral infarction: Secondary | ICD-10-CM | POA: Diagnosis not present

## 2022-06-30 DIAGNOSIS — F331 Major depressive disorder, recurrent, moderate: Secondary | ICD-10-CM | POA: Diagnosis not present

## 2022-06-30 DIAGNOSIS — E038 Other specified hypothyroidism: Secondary | ICD-10-CM | POA: Diagnosis not present

## 2022-06-30 DIAGNOSIS — I129 Hypertensive chronic kidney disease with stage 1 through stage 4 chronic kidney disease, or unspecified chronic kidney disease: Secondary | ICD-10-CM | POA: Diagnosis not present

## 2022-06-30 DIAGNOSIS — M24542 Contracture, left hand: Secondary | ICD-10-CM | POA: Diagnosis not present

## 2022-06-30 DIAGNOSIS — N1832 Chronic kidney disease, stage 3b: Secondary | ICD-10-CM | POA: Diagnosis not present

## 2022-06-30 DIAGNOSIS — Z9181 History of falling: Secondary | ICD-10-CM | POA: Diagnosis not present

## 2022-06-30 DIAGNOSIS — J302 Other seasonal allergic rhinitis: Secondary | ICD-10-CM | POA: Diagnosis not present

## 2022-06-30 DIAGNOSIS — R2689 Other abnormalities of gait and mobility: Secondary | ICD-10-CM | POA: Diagnosis not present

## 2022-06-30 DIAGNOSIS — F0283 Dementia in other diseases classified elsewhere, unspecified severity, with mood disturbance: Secondary | ICD-10-CM | POA: Diagnosis not present

## 2022-07-06 DIAGNOSIS — M24541 Contracture, right hand: Secondary | ICD-10-CM | POA: Diagnosis not present

## 2022-07-06 DIAGNOSIS — E042 Nontoxic multinodular goiter: Secondary | ICD-10-CM | POA: Diagnosis not present

## 2022-07-06 DIAGNOSIS — I69398 Other sequelae of cerebral infarction: Secondary | ICD-10-CM | POA: Diagnosis not present

## 2022-07-06 DIAGNOSIS — N1832 Chronic kidney disease, stage 3b: Secondary | ICD-10-CM | POA: Diagnosis not present

## 2022-07-06 DIAGNOSIS — F331 Major depressive disorder, recurrent, moderate: Secondary | ICD-10-CM | POA: Diagnosis not present

## 2022-07-06 DIAGNOSIS — E038 Other specified hypothyroidism: Secondary | ICD-10-CM | POA: Diagnosis not present

## 2022-07-06 DIAGNOSIS — M24542 Contracture, left hand: Secondary | ICD-10-CM | POA: Diagnosis not present

## 2022-07-06 DIAGNOSIS — Z9181 History of falling: Secondary | ICD-10-CM | POA: Diagnosis not present

## 2022-07-06 DIAGNOSIS — F0283 Dementia in other diseases classified elsewhere, unspecified severity, with mood disturbance: Secondary | ICD-10-CM | POA: Diagnosis not present

## 2022-07-06 DIAGNOSIS — J302 Other seasonal allergic rhinitis: Secondary | ICD-10-CM | POA: Diagnosis not present

## 2022-07-06 DIAGNOSIS — R2689 Other abnormalities of gait and mobility: Secondary | ICD-10-CM | POA: Diagnosis not present

## 2022-07-06 DIAGNOSIS — E7800 Pure hypercholesterolemia, unspecified: Secondary | ICD-10-CM | POA: Diagnosis not present

## 2022-07-06 DIAGNOSIS — N390 Urinary tract infection, site not specified: Secondary | ICD-10-CM | POA: Diagnosis not present

## 2022-07-06 DIAGNOSIS — M810 Age-related osteoporosis without current pathological fracture: Secondary | ICD-10-CM | POA: Diagnosis not present

## 2022-07-06 DIAGNOSIS — I129 Hypertensive chronic kidney disease with stage 1 through stage 4 chronic kidney disease, or unspecified chronic kidney disease: Secondary | ICD-10-CM | POA: Diagnosis not present

## 2022-07-13 DIAGNOSIS — R2689 Other abnormalities of gait and mobility: Secondary | ICD-10-CM | POA: Diagnosis not present

## 2022-07-13 DIAGNOSIS — Z9181 History of falling: Secondary | ICD-10-CM | POA: Diagnosis not present

## 2022-07-13 DIAGNOSIS — I69398 Other sequelae of cerebral infarction: Secondary | ICD-10-CM | POA: Diagnosis not present

## 2022-07-13 DIAGNOSIS — F0283 Dementia in other diseases classified elsewhere, unspecified severity, with mood disturbance: Secondary | ICD-10-CM | POA: Diagnosis not present

## 2022-07-13 DIAGNOSIS — M24542 Contracture, left hand: Secondary | ICD-10-CM | POA: Diagnosis not present

## 2022-07-13 DIAGNOSIS — F331 Major depressive disorder, recurrent, moderate: Secondary | ICD-10-CM | POA: Diagnosis not present

## 2022-07-13 DIAGNOSIS — E038 Other specified hypothyroidism: Secondary | ICD-10-CM | POA: Diagnosis not present

## 2022-07-13 DIAGNOSIS — E042 Nontoxic multinodular goiter: Secondary | ICD-10-CM | POA: Diagnosis not present

## 2022-07-13 DIAGNOSIS — E7800 Pure hypercholesterolemia, unspecified: Secondary | ICD-10-CM | POA: Diagnosis not present

## 2022-07-13 DIAGNOSIS — N390 Urinary tract infection, site not specified: Secondary | ICD-10-CM | POA: Diagnosis not present

## 2022-07-13 DIAGNOSIS — I129 Hypertensive chronic kidney disease with stage 1 through stage 4 chronic kidney disease, or unspecified chronic kidney disease: Secondary | ICD-10-CM | POA: Diagnosis not present

## 2022-07-13 DIAGNOSIS — M24541 Contracture, right hand: Secondary | ICD-10-CM | POA: Diagnosis not present

## 2022-07-13 DIAGNOSIS — J302 Other seasonal allergic rhinitis: Secondary | ICD-10-CM | POA: Diagnosis not present

## 2022-07-13 DIAGNOSIS — M810 Age-related osteoporosis without current pathological fracture: Secondary | ICD-10-CM | POA: Diagnosis not present

## 2022-07-13 DIAGNOSIS — N1832 Chronic kidney disease, stage 3b: Secondary | ICD-10-CM | POA: Diagnosis not present

## 2022-07-17 ENCOUNTER — Other Ambulatory Visit: Payer: Self-pay | Admitting: Family Medicine

## 2022-07-17 NOTE — Telephone Encounter (Signed)
Requested Prescriptions  Pending Prescriptions Disp Refills   spironolactone (ALDACTONE) 25 MG tablet [Pharmacy Med Name: SPIRONOLACTONE 25 MG TABLET] 90 tablet 0    Sig: TAKE 1 TABLET BY MOUTH EVERY DAY     Cardiovascular: Diuretics - Aldosterone Antagonist Failed - 07/17/2022  9:04 AM      Failed - Cr in normal range and within 180 days    Creat  Date Value Ref Range Status  03/11/2017 1.42 (H) 0.60 - 0.88 mg/dL Final    Comment:    For patients >32 years of age, the reference limit for Creatinine is approximately 13% higher for people identified as African-American. .    Creatinine, Ser  Date Value Ref Range Status  04/13/2022 1.43 (H) 0.57 - 1.00 mg/dL Final         Failed - Last BP in normal range    BP Readings from Last 1 Encounters:  05/05/22 (!) 142/73         Passed - K in normal range and within 180 days    Potassium  Date Value Ref Range Status  04/13/2022 5.0 3.5 - 5.2 mmol/L Final         Passed - Na in normal range and within 180 days    Sodium  Date Value Ref Range Status  04/13/2022 140 134 - 144 mmol/L Final         Passed - eGFR is 30 or above and within 180 days    GFR, Est African American  Date Value Ref Range Status  03/11/2017 38 (L) > OR = 60 mL/min/1.58m2 Final   GFR calc Af Amer  Date Value Ref Range Status  03/14/2020 36 (L) >59 mL/min/1.73 Final    Comment:    **In accordance with recommendations from the NKF-ASN Task force,**   [facility] is in the process of updating its eGFR calculation to the   2021 CKD-EPI creatinine equation that estimates kidney function   without a race variable.    GFR, Est Non African American  Date Value Ref Range Status  03/11/2017 33 (L) > OR = 60 mL/min/1.46m2 Final   GFR, Estimated  Date Value Ref Range Status  12/17/2021 41 (L) >60 mL/min Final    Comment:    (NOTE) Calculated using the CKD-EPI Creatinine Equation (2021)    eGFR  Date Value Ref Range Status  04/13/2022 34 (L) >59 mL/min/1.73  Final         Passed - Valid encounter within last 6 months    Recent Outpatient Visits           2 months ago History of UTI   [place], [name], MD   3 months ago Altered mental status, unspecified altered mental status type   [place], DO   3 months ago [place], [name], MD   9 months ago Encounter for annual wellness visit (AWV) in Medicare patient   [place], [name], MD   1 year ago Mixed dementia [facility])   [place] Bacigalupo, [name], MD       Future Appointments             In 3 months Bacigal[place], [name], MD [facility]

## 2022-07-17 NOTE — Telephone Encounter (Signed)
Requested Prescriptions  Pending Prescriptions Disp Refills   mirtazapine (REMERON) 15 MG tablet [Pharmacy Med Name: MIRTAZAPINE 15 MG TABLET] 90 tablet 0    Sig: TAKE 1 TABLET BY MOUTH EVERYDAY AT BEDTIME     Psychiatry: Antidepressants - mirtazapine Passed - 07/17/2022  9:04 AM      Passed - Completed PHQ-2 or PHQ-9 in the last 360 days      Passed - Valid encounter within last 6 months    Recent Outpatient Visits           2 months ago History of UTI   [place], [name], MD   3 months ago Altered mental status, unspecified altered mental status type   [place], DO   3 months ago [place], [name], MD   9 months ago Encounter for annual wellness visit (AWV) in Medicare patient   [place], [name], MD   1 year ago Mixed dementia [facility])   [place] Bacigalupo, [name], MD       Future Appointments             In 3 months Bacigalupo, [name], MD [facility]

## 2022-07-20 DIAGNOSIS — Z9181 History of falling: Secondary | ICD-10-CM | POA: Diagnosis not present

## 2022-07-20 DIAGNOSIS — N390 Urinary tract infection, site not specified: Secondary | ICD-10-CM | POA: Diagnosis not present

## 2022-07-20 DIAGNOSIS — E7800 Pure hypercholesterolemia, unspecified: Secondary | ICD-10-CM | POA: Diagnosis not present

## 2022-07-20 DIAGNOSIS — F0283 Dementia in other diseases classified elsewhere, unspecified severity, with mood disturbance: Secondary | ICD-10-CM | POA: Diagnosis not present

## 2022-07-20 DIAGNOSIS — F331 Major depressive disorder, recurrent, moderate: Secondary | ICD-10-CM | POA: Diagnosis not present

## 2022-07-20 DIAGNOSIS — E042 Nontoxic multinodular goiter: Secondary | ICD-10-CM | POA: Diagnosis not present

## 2022-07-20 DIAGNOSIS — J302 Other seasonal allergic rhinitis: Secondary | ICD-10-CM | POA: Diagnosis not present

## 2022-07-20 DIAGNOSIS — E038 Other specified hypothyroidism: Secondary | ICD-10-CM | POA: Diagnosis not present

## 2022-07-20 DIAGNOSIS — M24542 Contracture, left hand: Secondary | ICD-10-CM | POA: Diagnosis not present

## 2022-07-20 DIAGNOSIS — I69398 Other sequelae of cerebral infarction: Secondary | ICD-10-CM | POA: Diagnosis not present

## 2022-07-20 DIAGNOSIS — R2689 Other abnormalities of gait and mobility: Secondary | ICD-10-CM | POA: Diagnosis not present

## 2022-07-20 DIAGNOSIS — I129 Hypertensive chronic kidney disease with stage 1 through stage 4 chronic kidney disease, or unspecified chronic kidney disease: Secondary | ICD-10-CM | POA: Diagnosis not present

## 2022-07-20 DIAGNOSIS — M24541 Contracture, right hand: Secondary | ICD-10-CM | POA: Diagnosis not present

## 2022-07-20 DIAGNOSIS — M810 Age-related osteoporosis without current pathological fracture: Secondary | ICD-10-CM | POA: Diagnosis not present

## 2022-07-20 DIAGNOSIS — N1832 Chronic kidney disease, stage 3b: Secondary | ICD-10-CM | POA: Diagnosis not present

## 2022-07-23 ENCOUNTER — Other Ambulatory Visit: Payer: Self-pay | Admitting: Family Medicine

## 2022-07-23 NOTE — Telephone Encounter (Signed)
Refused the Remeron and Aldactone because they are duplicate requests.

## 2022-07-27 DIAGNOSIS — M24542 Contracture, left hand: Secondary | ICD-10-CM | POA: Diagnosis not present

## 2022-07-27 DIAGNOSIS — J302 Other seasonal allergic rhinitis: Secondary | ICD-10-CM | POA: Diagnosis not present

## 2022-07-27 DIAGNOSIS — M24541 Contracture, right hand: Secondary | ICD-10-CM | POA: Diagnosis not present

## 2022-07-27 DIAGNOSIS — I129 Hypertensive chronic kidney disease with stage 1 through stage 4 chronic kidney disease, or unspecified chronic kidney disease: Secondary | ICD-10-CM | POA: Diagnosis not present

## 2022-07-27 DIAGNOSIS — E042 Nontoxic multinodular goiter: Secondary | ICD-10-CM | POA: Diagnosis not present

## 2022-07-27 DIAGNOSIS — E038 Other specified hypothyroidism: Secondary | ICD-10-CM | POA: Diagnosis not present

## 2022-07-27 DIAGNOSIS — Z9181 History of falling: Secondary | ICD-10-CM | POA: Diagnosis not present

## 2022-07-27 DIAGNOSIS — E7800 Pure hypercholesterolemia, unspecified: Secondary | ICD-10-CM | POA: Diagnosis not present

## 2022-07-27 DIAGNOSIS — N390 Urinary tract infection, site not specified: Secondary | ICD-10-CM | POA: Diagnosis not present

## 2022-07-27 DIAGNOSIS — F331 Major depressive disorder, recurrent, moderate: Secondary | ICD-10-CM | POA: Diagnosis not present

## 2022-07-27 DIAGNOSIS — N1832 Chronic kidney disease, stage 3b: Secondary | ICD-10-CM | POA: Diagnosis not present

## 2022-07-27 DIAGNOSIS — R2689 Other abnormalities of gait and mobility: Secondary | ICD-10-CM | POA: Diagnosis not present

## 2022-07-27 DIAGNOSIS — M810 Age-related osteoporosis without current pathological fracture: Secondary | ICD-10-CM | POA: Diagnosis not present

## 2022-07-27 DIAGNOSIS — I69398 Other sequelae of cerebral infarction: Secondary | ICD-10-CM | POA: Diagnosis not present

## 2022-07-27 DIAGNOSIS — F0283 Dementia in other diseases classified elsewhere, unspecified severity, with mood disturbance: Secondary | ICD-10-CM | POA: Diagnosis not present

## 2022-08-03 DIAGNOSIS — F331 Major depressive disorder, recurrent, moderate: Secondary | ICD-10-CM | POA: Diagnosis not present

## 2022-08-03 DIAGNOSIS — N1832 Chronic kidney disease, stage 3b: Secondary | ICD-10-CM | POA: Diagnosis not present

## 2022-08-03 DIAGNOSIS — M24542 Contracture, left hand: Secondary | ICD-10-CM | POA: Diagnosis not present

## 2022-08-03 DIAGNOSIS — M24541 Contracture, right hand: Secondary | ICD-10-CM | POA: Diagnosis not present

## 2022-08-03 DIAGNOSIS — E038 Other specified hypothyroidism: Secondary | ICD-10-CM | POA: Diagnosis not present

## 2022-08-03 DIAGNOSIS — F0283 Dementia in other diseases classified elsewhere, unspecified severity, with mood disturbance: Secondary | ICD-10-CM | POA: Diagnosis not present

## 2022-08-03 DIAGNOSIS — I129 Hypertensive chronic kidney disease with stage 1 through stage 4 chronic kidney disease, or unspecified chronic kidney disease: Secondary | ICD-10-CM | POA: Diagnosis not present

## 2022-08-03 DIAGNOSIS — J302 Other seasonal allergic rhinitis: Secondary | ICD-10-CM | POA: Diagnosis not present

## 2022-08-03 DIAGNOSIS — E7800 Pure hypercholesterolemia, unspecified: Secondary | ICD-10-CM | POA: Diagnosis not present

## 2022-08-03 DIAGNOSIS — M810 Age-related osteoporosis without current pathological fracture: Secondary | ICD-10-CM | POA: Diagnosis not present

## 2022-08-03 DIAGNOSIS — Z9181 History of falling: Secondary | ICD-10-CM | POA: Diagnosis not present

## 2022-08-03 DIAGNOSIS — I69398 Other sequelae of cerebral infarction: Secondary | ICD-10-CM | POA: Diagnosis not present

## 2022-08-03 DIAGNOSIS — N390 Urinary tract infection, site not specified: Secondary | ICD-10-CM | POA: Diagnosis not present

## 2022-08-03 DIAGNOSIS — E042 Nontoxic multinodular goiter: Secondary | ICD-10-CM | POA: Diagnosis not present

## 2022-08-03 DIAGNOSIS — R2689 Other abnormalities of gait and mobility: Secondary | ICD-10-CM | POA: Diagnosis not present

## 2022-08-10 DIAGNOSIS — E038 Other specified hypothyroidism: Secondary | ICD-10-CM | POA: Diagnosis not present

## 2022-08-10 DIAGNOSIS — M5416 Radiculopathy, lumbar region: Secondary | ICD-10-CM | POA: Diagnosis not present

## 2022-08-10 DIAGNOSIS — R2689 Other abnormalities of gait and mobility: Secondary | ICD-10-CM | POA: Diagnosis not present

## 2022-08-10 DIAGNOSIS — I69398 Other sequelae of cerebral infarction: Secondary | ICD-10-CM | POA: Diagnosis not present

## 2022-08-10 DIAGNOSIS — M810 Age-related osteoporosis without current pathological fracture: Secondary | ICD-10-CM | POA: Diagnosis not present

## 2022-08-10 DIAGNOSIS — Z9181 History of falling: Secondary | ICD-10-CM | POA: Diagnosis not present

## 2022-08-10 DIAGNOSIS — I129 Hypertensive chronic kidney disease with stage 1 through stage 4 chronic kidney disease, or unspecified chronic kidney disease: Secondary | ICD-10-CM | POA: Diagnosis not present

## 2022-08-10 DIAGNOSIS — E042 Nontoxic multinodular goiter: Secondary | ICD-10-CM | POA: Diagnosis not present

## 2022-08-10 DIAGNOSIS — E7800 Pure hypercholesterolemia, unspecified: Secondary | ICD-10-CM | POA: Diagnosis not present

## 2022-08-10 DIAGNOSIS — M24541 Contracture, right hand: Secondary | ICD-10-CM | POA: Diagnosis not present

## 2022-08-10 DIAGNOSIS — N1832 Chronic kidney disease, stage 3b: Secondary | ICD-10-CM | POA: Diagnosis not present

## 2022-08-10 DIAGNOSIS — N390 Urinary tract infection, site not specified: Secondary | ICD-10-CM | POA: Diagnosis not present

## 2022-08-10 DIAGNOSIS — J302 Other seasonal allergic rhinitis: Secondary | ICD-10-CM | POA: Diagnosis not present

## 2022-08-10 DIAGNOSIS — M48062 Spinal stenosis, lumbar region with neurogenic claudication: Secondary | ICD-10-CM | POA: Diagnosis not present

## 2022-08-10 DIAGNOSIS — M24542 Contracture, left hand: Secondary | ICD-10-CM | POA: Diagnosis not present

## 2022-08-10 DIAGNOSIS — F331 Major depressive disorder, recurrent, moderate: Secondary | ICD-10-CM | POA: Diagnosis not present

## 2022-08-10 DIAGNOSIS — F0283 Dementia in other diseases classified elsewhere, unspecified severity, with mood disturbance: Secondary | ICD-10-CM | POA: Diagnosis not present

## 2022-08-17 DIAGNOSIS — N1832 Chronic kidney disease, stage 3b: Secondary | ICD-10-CM | POA: Diagnosis not present

## 2022-08-17 DIAGNOSIS — M24541 Contracture, right hand: Secondary | ICD-10-CM | POA: Diagnosis not present

## 2022-08-17 DIAGNOSIS — Z9181 History of falling: Secondary | ICD-10-CM | POA: Diagnosis not present

## 2022-08-17 DIAGNOSIS — E042 Nontoxic multinodular goiter: Secondary | ICD-10-CM | POA: Diagnosis not present

## 2022-08-17 DIAGNOSIS — E038 Other specified hypothyroidism: Secondary | ICD-10-CM | POA: Diagnosis not present

## 2022-08-17 DIAGNOSIS — R2689 Other abnormalities of gait and mobility: Secondary | ICD-10-CM | POA: Diagnosis not present

## 2022-08-17 DIAGNOSIS — I129 Hypertensive chronic kidney disease with stage 1 through stage 4 chronic kidney disease, or unspecified chronic kidney disease: Secondary | ICD-10-CM | POA: Diagnosis not present

## 2022-08-17 DIAGNOSIS — F0283 Dementia in other diseases classified elsewhere, unspecified severity, with mood disturbance: Secondary | ICD-10-CM | POA: Diagnosis not present

## 2022-08-17 DIAGNOSIS — F331 Major depressive disorder, recurrent, moderate: Secondary | ICD-10-CM | POA: Diagnosis not present

## 2022-08-17 DIAGNOSIS — J302 Other seasonal allergic rhinitis: Secondary | ICD-10-CM | POA: Diagnosis not present

## 2022-08-17 DIAGNOSIS — M24542 Contracture, left hand: Secondary | ICD-10-CM | POA: Diagnosis not present

## 2022-08-17 DIAGNOSIS — N390 Urinary tract infection, site not specified: Secondary | ICD-10-CM | POA: Diagnosis not present

## 2022-08-17 DIAGNOSIS — I69398 Other sequelae of cerebral infarction: Secondary | ICD-10-CM | POA: Diagnosis not present

## 2022-08-17 DIAGNOSIS — E7800 Pure hypercholesterolemia, unspecified: Secondary | ICD-10-CM | POA: Diagnosis not present

## 2022-08-17 DIAGNOSIS — M810 Age-related osteoporosis without current pathological fracture: Secondary | ICD-10-CM | POA: Diagnosis not present

## 2022-08-24 DIAGNOSIS — R2689 Other abnormalities of gait and mobility: Secondary | ICD-10-CM | POA: Diagnosis not present

## 2022-08-24 DIAGNOSIS — E038 Other specified hypothyroidism: Secondary | ICD-10-CM | POA: Diagnosis not present

## 2022-08-24 DIAGNOSIS — F0283 Dementia in other diseases classified elsewhere, unspecified severity, with mood disturbance: Secondary | ICD-10-CM | POA: Diagnosis not present

## 2022-08-24 DIAGNOSIS — I69398 Other sequelae of cerebral infarction: Secondary | ICD-10-CM | POA: Diagnosis not present

## 2022-08-24 DIAGNOSIS — M810 Age-related osteoporosis without current pathological fracture: Secondary | ICD-10-CM | POA: Diagnosis not present

## 2022-08-24 DIAGNOSIS — I129 Hypertensive chronic kidney disease with stage 1 through stage 4 chronic kidney disease, or unspecified chronic kidney disease: Secondary | ICD-10-CM | POA: Diagnosis not present

## 2022-08-24 DIAGNOSIS — Z9181 History of falling: Secondary | ICD-10-CM | POA: Diagnosis not present

## 2022-08-24 DIAGNOSIS — J302 Other seasonal allergic rhinitis: Secondary | ICD-10-CM | POA: Diagnosis not present

## 2022-08-24 DIAGNOSIS — E042 Nontoxic multinodular goiter: Secondary | ICD-10-CM | POA: Diagnosis not present

## 2022-08-24 DIAGNOSIS — F331 Major depressive disorder, recurrent, moderate: Secondary | ICD-10-CM | POA: Diagnosis not present

## 2022-08-24 DIAGNOSIS — M24541 Contracture, right hand: Secondary | ICD-10-CM | POA: Diagnosis not present

## 2022-08-24 DIAGNOSIS — N1832 Chronic kidney disease, stage 3b: Secondary | ICD-10-CM | POA: Diagnosis not present

## 2022-08-24 DIAGNOSIS — N390 Urinary tract infection, site not specified: Secondary | ICD-10-CM | POA: Diagnosis not present

## 2022-08-24 DIAGNOSIS — E7800 Pure hypercholesterolemia, unspecified: Secondary | ICD-10-CM | POA: Diagnosis not present

## 2022-08-24 DIAGNOSIS — M24542 Contracture, left hand: Secondary | ICD-10-CM | POA: Diagnosis not present

## 2022-08-28 ENCOUNTER — Telehealth: Payer: Self-pay | Admitting: Family Medicine

## 2022-08-28 NOTE — Telephone Encounter (Signed)
Home Health Verbal Orders - Caller/Agency: Cala Bradford with [facility] Callback Number: [phone] Requesting : PT Frequency: 1X9   Please call Cala Bradford back, she has a secure voice mail where a message could be left.

## 2022-08-31 DIAGNOSIS — E042 Nontoxic multinodular goiter: Secondary | ICD-10-CM | POA: Diagnosis not present

## 2022-08-31 DIAGNOSIS — F331 Major depressive disorder, recurrent, moderate: Secondary | ICD-10-CM | POA: Diagnosis not present

## 2022-08-31 DIAGNOSIS — I69398 Other sequelae of cerebral infarction: Secondary | ICD-10-CM | POA: Diagnosis not present

## 2022-08-31 DIAGNOSIS — Z9181 History of falling: Secondary | ICD-10-CM | POA: Diagnosis not present

## 2022-08-31 DIAGNOSIS — E038 Other specified hypothyroidism: Secondary | ICD-10-CM | POA: Diagnosis not present

## 2022-08-31 DIAGNOSIS — N1832 Chronic kidney disease, stage 3b: Secondary | ICD-10-CM | POA: Diagnosis not present

## 2022-08-31 DIAGNOSIS — I129 Hypertensive chronic kidney disease with stage 1 through stage 4 chronic kidney disease, or unspecified chronic kidney disease: Secondary | ICD-10-CM | POA: Diagnosis not present

## 2022-08-31 DIAGNOSIS — E7800 Pure hypercholesterolemia, unspecified: Secondary | ICD-10-CM | POA: Diagnosis not present

## 2022-08-31 DIAGNOSIS — J302 Other seasonal allergic rhinitis: Secondary | ICD-10-CM | POA: Diagnosis not present

## 2022-08-31 DIAGNOSIS — F0283 Dementia in other diseases classified elsewhere, unspecified severity, with mood disturbance: Secondary | ICD-10-CM | POA: Diagnosis not present

## 2022-08-31 DIAGNOSIS — M24542 Contracture, left hand: Secondary | ICD-10-CM | POA: Diagnosis not present

## 2022-08-31 DIAGNOSIS — M810 Age-related osteoporosis without current pathological fracture: Secondary | ICD-10-CM | POA: Diagnosis not present

## 2022-08-31 DIAGNOSIS — R2689 Other abnormalities of gait and mobility: Secondary | ICD-10-CM | POA: Diagnosis not present

## 2022-08-31 DIAGNOSIS — N390 Urinary tract infection, site not specified: Secondary | ICD-10-CM | POA: Diagnosis not present

## 2022-08-31 DIAGNOSIS — M24541 Contracture, right hand: Secondary | ICD-10-CM | POA: Diagnosis not present

## 2022-08-31 NOTE — Telephone Encounter (Signed)
Verbals given with detailed message. Ok for [facility] to advise if caller/agency returns call

## 2022-08-31 NOTE — Telephone Encounter (Signed)
That's fine

## 2022-09-07 DIAGNOSIS — Z79899 Other long term (current) drug therapy: Secondary | ICD-10-CM | POA: Diagnosis not present

## 2022-09-07 DIAGNOSIS — M5416 Radiculopathy, lumbar region: Secondary | ICD-10-CM | POA: Diagnosis not present

## 2022-09-07 DIAGNOSIS — M48062 Spinal stenosis, lumbar region with neurogenic claudication: Secondary | ICD-10-CM | POA: Diagnosis not present

## 2022-09-07 DIAGNOSIS — M5136 Other intervertebral disc degeneration, lumbar region: Secondary | ICD-10-CM | POA: Diagnosis not present

## 2022-09-08 ENCOUNTER — Encounter: Payer: Self-pay | Admitting: Family Medicine

## 2022-09-08 ENCOUNTER — Telehealth (INDEPENDENT_AMBULATORY_CARE_PROVIDER_SITE_OTHER): Payer: PPO | Admitting: Family Medicine

## 2022-09-08 VITALS — BP 129/60

## 2022-09-08 DIAGNOSIS — M810 Age-related osteoporosis without current pathological fracture: Secondary | ICD-10-CM | POA: Diagnosis not present

## 2022-09-08 DIAGNOSIS — R636 Underweight: Secondary | ICD-10-CM

## 2022-09-08 DIAGNOSIS — E0590 Thyrotoxicosis, unspecified without thyrotoxic crisis or storm: Secondary | ICD-10-CM | POA: Diagnosis not present

## 2022-09-08 DIAGNOSIS — F0280 Dementia in other diseases classified elsewhere without behavioral disturbance: Secondary | ICD-10-CM

## 2022-09-08 DIAGNOSIS — R2689 Other abnormalities of gait and mobility: Secondary | ICD-10-CM

## 2022-09-08 DIAGNOSIS — G309 Alzheimer's disease, unspecified: Secondary | ICD-10-CM

## 2022-09-08 DIAGNOSIS — I10 Essential (primary) hypertension: Secondary | ICD-10-CM

## 2022-09-08 DIAGNOSIS — N1832 Chronic kidney disease, stage 3b: Secondary | ICD-10-CM

## 2022-09-08 DIAGNOSIS — F0150 Vascular dementia without behavioral disturbance: Secondary | ICD-10-CM

## 2022-09-08 DIAGNOSIS — R0982 Postnasal drip: Secondary | ICD-10-CM

## 2022-09-08 MED ORDER — FLUTICASONE PROPIONATE 50 MCG/ACT NA SUSP: 2.0000 | Freq: Every day | NASAL | 6 refills | Status: AC

## 2022-09-08 NOTE — Progress Notes (Signed)
I,Becky Prince,acting as a Neurosurgeon for Becky Latch, MD.,have documented all relevant documentation on the behalf of Becky Latch, MD,as directed by  Becky Latch, MD while in the presence of Becky Latch, MD.   MyChart Video Visit    Virtual Visit via Video Note   This format is felt to be most appropriate for this patient at this time. Physical exam was limited by quality of the video and audio technology used for the visit.    Patient location: home Provider location: [facility] Persons involved in the visit: patient, provider, patient's daughter Rosey Bath  I discussed the limitations of evaluation and management by telemedicine and the availability of in person appointments. The patient expressed understanding and agreed to proceed.  Patient: Becky Prince   DOB: 07/30/1925   89 y.o. Female  MRN: [id number] Visit Date: 09/08/2022  Today's healthcare provider: [name], MD   No chief complaint on file.  Subjective    HPI  Rosey Bath is helping with virtual visit.  Patient requesting orders for home health order. They are requesting continued orders for PT. She is getting a lot of value out of it currently. Would like for it to continue. She is walking outside now. No falls since last visit  Daughter is requesting someone to go out to see Becky Prince once a week for vitals and mediation management.   Patient is having trouble bring up phlegm every morning. And phlegm is yellow.   Medications: Outpatient Medications Prior to Visit  Medication Sig   atorvastatin (LIPITOR) 40 MG tablet TAKE 1 TABLET BY MOUTH EVERY DAY   citalopram (CELEXA) 20 MG tablet TAKE 1 TABLET BY MOUTH EVERY DAY   donepezil (ARICEPT) 10 MG tablet TAKE 1 TABLET BY MOUTH EVERYDAY AT BEDTIME   HYDROcodone-acetaminophen (NORCO/VICODIN) 5-325 MG tablet Take 0.5 tablets by mouth every 6 (six) hours as needed.   mirtazapine (REMERON) 15 MG tablet TAKE 1 TABLET BY MOUTH  EVERYDAY AT BEDTIME   naloxone (NARCAN) nasal spray 4 mg/0.1 mL Place 1 spray into the nose once.   spironolactone (ALDACTONE) 25 MG tablet TAKE 1 TABLET BY MOUTH EVERY DAY   traMADol (ULTRAM) 50 MG tablet Take 50 mg by mouth every 6 (six) hours as needed.   No facility-administered medications prior to visit.    Review of Systems per HPI     Objective    BP 129/60   BP Readings from Last 3 Encounters:  09/08/22 129/60  05/05/22 (!) 142/73  04/13/22 132/68   Wt Readings from Last 3 Encounters:  05/05/22 121 lb 8 oz (55.1 kg)  04/13/22 114 lb 9.6 oz (52 kg)  03/31/22 120 lb 1.6 oz (54.5 kg)       Physical Exam Constitutional:      General: She is not in acute distress.    Appearance: Normal appearance.  HENT:     Head: Normocephalic.  Pulmonary:     Effort: Pulmonary effort is normal. No respiratory distress.  Neurological:     Mental Status: She is alert and oriented to person, place, and time. Mental status is at baseline.        Assessment & Plan     Problem List Items Addressed This Visit       Cardiovascular and Mediastinum   BP (high blood pressure)   Relevant Orders   Ambulatory referral to Home Health   Mixed dementia [facility]) - Primary    Patient would benefit from [facility] RN services to  monitor vital signs for HTN and monitor medications in stting of dementia Patient is getting great value out of HH PT and should continue Referral order placed today No med changes today, except adding flonase for post nasal drip Return precautions discussed      Relevant Orders   Ambulatory referral to Home Health     Endocrine   Subclinical hyperthyroidism   Relevant Orders   Ambulatory referral to Home Health     Musculoskeletal and Integument   Osteoporosis   Relevant Orders   Ambulatory referral to Home Health     Genitourinary   Stage 3b chronic kidney disease (HCC)   Relevant Orders   Ambulatory referral to Home Health     Other   Underweight    Relevant Orders   Ambulatory referral to Home Health   Imbalance   Relevant Orders   Ambulatory referral to Home Health   Postnasal drip     No follow-ups on file.     I discussed the assessment and treatment plan with the patient. The patient was provided an opportunity to ask questions and all were answered. The patient agreed with the plan and demonstrated an understanding of the instructions.   The patient was advised to call back or seek an in-person evaluation if the symptoms worsen or if the condition fails to improve as anticipated.  I provided 15 minutes of non-face-to-face time during this encounter.  I, Becky Latch, MD, have reviewed all documentation for this visit. The documentation on 09/08/22 for the exam, diagnosis, procedures, and orders are all accurate and complete.   Paulmichael Schreck, Marzella Schlein, MD, MPH [facility]

## 2022-09-08 NOTE — Assessment & Plan Note (Signed)
Patient would benefit from [facility] RN services to monitor vital signs for HTN and monitor medications in stting of dementia Patient is getting great value out of [facility] PT and should continue Referral order placed today No med changes today, except adding flonase for post nasal drip Return precautions discussed

## 2022-09-14 DIAGNOSIS — N390 Urinary tract infection, site not specified: Secondary | ICD-10-CM | POA: Diagnosis not present

## 2022-09-14 DIAGNOSIS — N1832 Chronic kidney disease, stage 3b: Secondary | ICD-10-CM | POA: Diagnosis not present

## 2022-09-14 DIAGNOSIS — M24542 Contracture, left hand: Secondary | ICD-10-CM | POA: Diagnosis not present

## 2022-09-14 DIAGNOSIS — E038 Other specified hypothyroidism: Secondary | ICD-10-CM | POA: Diagnosis not present

## 2022-09-14 DIAGNOSIS — I129 Hypertensive chronic kidney disease with stage 1 through stage 4 chronic kidney disease, or unspecified chronic kidney disease: Secondary | ICD-10-CM | POA: Diagnosis not present

## 2022-09-14 DIAGNOSIS — F0283 Dementia in other diseases classified elsewhere, unspecified severity, with mood disturbance: Secondary | ICD-10-CM | POA: Diagnosis not present

## 2022-09-14 DIAGNOSIS — R2689 Other abnormalities of gait and mobility: Secondary | ICD-10-CM | POA: Diagnosis not present

## 2022-09-14 DIAGNOSIS — M24541 Contracture, right hand: Secondary | ICD-10-CM | POA: Diagnosis not present

## 2022-09-14 DIAGNOSIS — M810 Age-related osteoporosis without current pathological fracture: Secondary | ICD-10-CM | POA: Diagnosis not present

## 2022-09-14 DIAGNOSIS — J302 Other seasonal allergic rhinitis: Secondary | ICD-10-CM | POA: Diagnosis not present

## 2022-09-14 DIAGNOSIS — E7800 Pure hypercholesterolemia, unspecified: Secondary | ICD-10-CM | POA: Diagnosis not present

## 2022-09-14 DIAGNOSIS — I69398 Other sequelae of cerebral infarction: Secondary | ICD-10-CM | POA: Diagnosis not present

## 2022-09-14 DIAGNOSIS — E042 Nontoxic multinodular goiter: Secondary | ICD-10-CM | POA: Diagnosis not present

## 2022-09-14 DIAGNOSIS — Z9181 History of falling: Secondary | ICD-10-CM | POA: Diagnosis not present

## 2022-09-14 DIAGNOSIS — F331 Major depressive disorder, recurrent, moderate: Secondary | ICD-10-CM | POA: Diagnosis not present

## 2022-09-15 DIAGNOSIS — N1832 Chronic kidney disease, stage 3b: Secondary | ICD-10-CM | POA: Diagnosis not present

## 2022-09-15 DIAGNOSIS — I129 Hypertensive chronic kidney disease with stage 1 through stage 4 chronic kidney disease, or unspecified chronic kidney disease: Secondary | ICD-10-CM | POA: Diagnosis not present

## 2022-09-17 ENCOUNTER — Other Ambulatory Visit: Payer: Self-pay | Admitting: Family Medicine

## 2022-09-21 DIAGNOSIS — E038 Other specified hypothyroidism: Secondary | ICD-10-CM | POA: Diagnosis not present

## 2022-09-21 DIAGNOSIS — N390 Urinary tract infection, site not specified: Secondary | ICD-10-CM | POA: Diagnosis not present

## 2022-09-21 DIAGNOSIS — R2689 Other abnormalities of gait and mobility: Secondary | ICD-10-CM | POA: Diagnosis not present

## 2022-09-21 DIAGNOSIS — E7800 Pure hypercholesterolemia, unspecified: Secondary | ICD-10-CM | POA: Diagnosis not present

## 2022-09-21 DIAGNOSIS — N1832 Chronic kidney disease, stage 3b: Secondary | ICD-10-CM | POA: Diagnosis not present

## 2022-09-21 DIAGNOSIS — I129 Hypertensive chronic kidney disease with stage 1 through stage 4 chronic kidney disease, or unspecified chronic kidney disease: Secondary | ICD-10-CM | POA: Diagnosis not present

## 2022-09-21 DIAGNOSIS — E042 Nontoxic multinodular goiter: Secondary | ICD-10-CM | POA: Diagnosis not present

## 2022-09-21 DIAGNOSIS — I69398 Other sequelae of cerebral infarction: Secondary | ICD-10-CM | POA: Diagnosis not present

## 2022-09-21 DIAGNOSIS — Z9181 History of falling: Secondary | ICD-10-CM | POA: Diagnosis not present

## 2022-09-21 DIAGNOSIS — M24542 Contracture, left hand: Secondary | ICD-10-CM | POA: Diagnosis not present

## 2022-09-21 DIAGNOSIS — M810 Age-related osteoporosis without current pathological fracture: Secondary | ICD-10-CM | POA: Diagnosis not present

## 2022-09-21 DIAGNOSIS — J302 Other seasonal allergic rhinitis: Secondary | ICD-10-CM | POA: Diagnosis not present

## 2022-09-21 DIAGNOSIS — M24541 Contracture, right hand: Secondary | ICD-10-CM | POA: Diagnosis not present

## 2022-09-21 DIAGNOSIS — F0283 Dementia in other diseases classified elsewhere, unspecified severity, with mood disturbance: Secondary | ICD-10-CM | POA: Diagnosis not present

## 2022-09-21 DIAGNOSIS — F331 Major depressive disorder, recurrent, moderate: Secondary | ICD-10-CM | POA: Diagnosis not present

## 2022-09-30 ENCOUNTER — Telehealth: Payer: Self-pay | Admitting: Family Medicine

## 2022-10-12 ENCOUNTER — Other Ambulatory Visit: Payer: Self-pay | Admitting: Family Medicine

## 2022-10-13 DIAGNOSIS — Z681 Body mass index (BMI) 19 or less, adult: Secondary | ICD-10-CM | POA: Diagnosis not present

## 2022-10-13 DIAGNOSIS — Z515 Encounter for palliative care: Secondary | ICD-10-CM | POA: Diagnosis not present

## 2022-10-13 DIAGNOSIS — I10 Essential (primary) hypertension: Secondary | ICD-10-CM | POA: Diagnosis not present

## 2022-10-13 DIAGNOSIS — F330 Major depressive disorder, recurrent, mild: Secondary | ICD-10-CM | POA: Diagnosis not present

## 2022-10-13 DIAGNOSIS — F0393 Unspecified dementia, unspecified severity, with mood disturbance: Secondary | ICD-10-CM | POA: Diagnosis not present

## 2022-10-16 DIAGNOSIS — M549 Dorsalgia, unspecified: Secondary | ICD-10-CM | POA: Diagnosis not present

## 2022-10-16 DIAGNOSIS — R5383 Other fatigue: Secondary | ICD-10-CM | POA: Diagnosis not present

## 2022-10-16 DIAGNOSIS — G8929 Other chronic pain: Secondary | ICD-10-CM | POA: Diagnosis not present

## 2022-10-23 DIAGNOSIS — F0390 Unspecified dementia without behavioral disturbance: Secondary | ICD-10-CM | POA: Diagnosis not present

## 2022-11-11 DIAGNOSIS — M5416 Radiculopathy, lumbar region: Secondary | ICD-10-CM | POA: Diagnosis not present

## 2022-11-11 DIAGNOSIS — M48062 Spinal stenosis, lumbar region with neurogenic claudication: Secondary | ICD-10-CM | POA: Diagnosis not present

## 2022-11-12 ENCOUNTER — Encounter: Payer: PPO | Admitting: Family Medicine

## 2022-11-13 ENCOUNTER — Ambulatory Visit (INDEPENDENT_AMBULATORY_CARE_PROVIDER_SITE_OTHER): Payer: PPO | Admitting: Family Medicine

## 2022-11-13 ENCOUNTER — Encounter: Payer: Self-pay | Admitting: Family Medicine

## 2022-11-13 VITALS — BP 154/50 | HR 59 | Temp 97.7°F | Resp 12 | Ht 68.0 in | Wt 117.9 lb

## 2022-11-13 DIAGNOSIS — N1832 Chronic kidney disease, stage 3b: Secondary | ICD-10-CM

## 2022-11-13 DIAGNOSIS — F331 Major depressive disorder, recurrent, moderate: Secondary | ICD-10-CM

## 2022-11-13 DIAGNOSIS — I10 Essential (primary) hypertension: Secondary | ICD-10-CM

## 2022-11-13 DIAGNOSIS — Z0000 Encounter for general adult medical examination without abnormal findings: Secondary | ICD-10-CM | POA: Diagnosis not present

## 2022-11-13 DIAGNOSIS — R636 Underweight: Secondary | ICD-10-CM

## 2022-11-13 DIAGNOSIS — E7800 Pure hypercholesterolemia, unspecified: Secondary | ICD-10-CM

## 2022-11-13 DIAGNOSIS — E0590 Thyrotoxicosis, unspecified without thyrotoxic crisis or storm: Secondary | ICD-10-CM

## 2022-11-13 DIAGNOSIS — E042 Nontoxic multinodular goiter: Secondary | ICD-10-CM | POA: Diagnosis not present

## 2022-11-13 DIAGNOSIS — F0150 Vascular dementia without behavioral disturbance: Secondary | ICD-10-CM

## 2022-11-13 MED ORDER — MIRTAZAPINE 15 MG PO TABS: 15.0000 mg | ORAL_TABLET | Freq: Every day | ORAL | 1 refills | Status: DC

## 2022-11-13 NOTE — Progress Notes (Signed)
Annual Wellness Visit     Patient: Becky Prince, Female    DOB: 1925-05-18, 89 y.o.   MRN: [id number]  Subjective  Chief Complaint  Patient presents with   Medicare Wellness    Becky Prince is a 89 y.o. female who presents today for her Annual Wellness Visit. She reports consuming a general diet.  She generally feels well. She reports sleeping well. She does not have additional problems to discuss today.   HPI  Discussed the use of AI scribe software for clinical note transcription with the patient, who gave verbal consent to proceed.  History of Present Illness   The patient, a 89 year old with a history of hypertension, dementia, transient ischemic attack (TIA), chronic kidney disease (CKD) stage 3b, major depressive disorder (MDD), and hyperlipidemia, presents for an annual wellness visit and physical. She is currently managed on Lipitor 40mg  daily for hyperlipidemia, Celexa 20mg  daily for MDD, Aricept 10mg  daily for dementia, Remeron 15mg  at bedtime to support appetite, and Spironolactone 25mg  daily for hypertension. The patient denies any new concerns or symptoms at this time. She is wearing hearing aids and report adequate hearing with them in place.            Medications: Outpatient Medications Prior to Visit  Medication Sig   atorvastatin (LIPITOR) 40 MG tablet TAKE 1 TABLET BY MOUTH EVERY DAY   citalopram (CELEXA) 20 MG tablet TAKE 1 TABLET BY MOUTH EVERY DAY   donepezil (ARICEPT) 10 MG tablet TAKE 1 TABLET BY MOUTH EVERYDAY AT BEDTIME   fluticasone (FLONASE) 50 MCG/ACT nasal spray Place 2 sprays into both nostrils daily.   HYDROcodone-acetaminophen (NORCO/VICODIN) 5-325 MG tablet Take 0.5 tablets by mouth every 6 (six) hours as needed.   naloxone (NARCAN) nasal spray 4 mg/0.1 mL Place 1 spray into the nose once.   spironolactone (ALDACTONE) 25 MG tablet TAKE 1 TABLET BY MOUTH EVERY DAY   traMADol (ULTRAM) 50 MG tablet Take 50 mg by mouth every 6 (six) hours as  needed.   [DISCONTINUED] mirtazapine (REMERON) 15 MG tablet TAKE 1 TABLET BY MOUTH EVERYDAY AT BEDTIME   No facility-administered medications prior to visit.    Allergies  Allergen Reactions   Naproxen Swelling    Had lip swelling 20 yrs ago - not sure if caused by oxycodone or naproxen   Oxycodone-Aspirin Swelling    Had lip swelling 20 yrs ago. Not sure if caused by oxycodone or naproxen.   Prednisone Diarrhea    Patient Care Team: Erasmo Downer, MD as PCP - General (Family Medicine) Merri Ray, MD as Referring Physician (Physical Medicine and Rehabilitation) Mosetta Pigeon, MD (Nephrology) Lockie Mola, MD as Referring Physician (Ophthalmology) Meeler, Jodelle Gross, FNP (Family Medicine)  ROS per HPI     Objective  BP (!) 154/50 (BP Location: Left Arm, Patient Position: Sitting, Cuff Size: Normal)   Pulse (!) 59   Temp 97.7 F (36.5 C) (Temporal)   Resp 12   Ht 5\' 8"  (1.727 m)   Wt 117 lb 14.4 oz (53.5 kg)   SpO2 96%   BMI 17.93 kg/m    Physical Exam Vitals reviewed.  Constitutional:      General: She is not in acute distress.    Appearance: Normal appearance. She is well-developed. She is not diaphoretic.  HENT:     Head: Normocephalic and atraumatic.     Right Ear: Tympanic membrane, ear canal and external ear normal.     Left Ear: Tympanic membrane, ear  canal and external ear normal.     Nose: Nose normal.     Mouth/Throat:     Mouth: Mucous membranes are moist.     Pharynx: Oropharynx is clear. No oropharyngeal exudate.  Eyes:     General: No scleral icterus.    Conjunctiva/sclera: Conjunctivae normal.     Pupils: Pupils are equal, round, and reactive to light.  Neck:     Thyroid: No thyromegaly.  Cardiovascular:     Rate and Rhythm: Normal rate and regular rhythm.     Heart sounds: Normal heart sounds.  Pulmonary:     Effort: Pulmonary effort is normal. No respiratory distress.     Breath sounds: Normal breath sounds. No  wheezing or rales.  Abdominal:     General: There is no distension.     Palpations: Abdomen is soft.     Tenderness: There is no abdominal tenderness.  Musculoskeletal:        General: No deformity.     Cervical back: Neck supple.     Right lower leg: No edema.     Left lower leg: No edema.  Lymphadenopathy:     Cervical: No cervical adenopathy.  Skin:    General: Skin is warm and dry.     Findings: No rash.  Neurological:     Mental Status: She is alert. Mental status is at baseline.     Gait: Gait abnormal (walks with cane).  Psychiatric:        Mood and Affect: Mood normal.        Behavior: Behavior normal.        Thought Content: Thought content normal.       Most recent functional status assessment:    11/13/2022   10:38 AM  In your present state of health, do you have any difficulty performing the following activities:  Hearing? 1  Vision? 0  Difficulty concentrating or making decisions? 1  Walking or climbing stairs? 1  Dressing or bathing? 0  Doing errands, shopping? 1   Most recent fall risk assessment:    11/13/2022   10:37 AM  Fall Risk   Falls in the past year? 0  Number falls in past yr: 0  Injury with Fall? 0  Risk for fall due to : No Fall Risks  Follow up Falls evaluation completed    Most recent depression screenings:    11/13/2022   10:37 AM 04/13/2022   11:30 AM  PHQ 2/9 Scores  PHQ - 2 Score 0 6  PHQ- 9 Score 1 14   Most recent cognitive screening:     No data to display         Most recent Audit-C alcohol use screening    04/13/2022   11:30 AM  Alcohol Use Disorder Test (AUDIT)  1. How often do you have a drink containing alcohol? 0  2. How many drinks containing alcohol do you have on a typical day when you are drinking? 0  3. How often do you have six or more drinks on one occasion? 0  AUDIT-C Score 0   A score of 3 or more in women, and 4 or more in men indicates increased risk for alcohol abuse, EXCEPT if all of the  points are from question 1   Vision/Hearing Screen: No results found.    No results found for any visits on 11/13/22.    Assessment & Plan   Annual wellness visit done today including the all of the following:  Reviewed patient's Family Medical History Reviewed and updated list of patient's medical providers Assessment of cognitive impairment was done Assessed patient's functional ability Established a written schedule for health screening services Health Risk Assessent Completed and Reviewed  Exercise Activities and Dietary recommendations  Goals      Increase water intake     Recommend increasing water intake to 6-8 8 oz glasses a day.         Immunization History  Administered Date(s) Administered   Fluad Quad(high Dose 65+) 02/25/2021, 02/19/2022   Influenza, High Dose Seasonal PF 02/09/2015, 03/06/2016, 01/18/2017, 03/17/2018, 12/27/2018, 02/19/2020   PFIZER Comirnaty(Gray Top)Covid-19 Tri-Sucrose Vaccine 09/26/2020   PFIZER(Purple Top)SARS-COV-2 Vaccination 05/16/2019, 06/07/2019, 01/26/2020   PPD Test 10/06/2019   Pneumococcal Conjugate-13 08/29/2014   Pneumococcal Polysaccharide-23 03/06/2016   Respiratory Syncytial Virus Vaccine,Recomb Aduvanted(Arexvy) 02/19/2022   Td 03/26/2003, 01/10/2011   Tdap 01/10/2011   Unspecified SARS-COV-2 Vaccination 02/19/2022   Zoster, Live 10/14/2006    Health Maintenance  Topic Date Due   Zoster Vaccines- Shingrix (1 of 2) 12/17/1944   DTaP/Tdap/Td (4 - Td or Tdap) 01/09/2021   COVID-19 Vaccine (6 - 2023-24 season) 04/16/2022   INFLUENZA VACCINE  11/26/2022   Medicare Annual Wellness (AWV)  11/13/2023   Pneumonia Vaccine 65+ Years old  Completed   DEXA SCAN  Completed   HPV VACCINES  Aged Out     Discussed health benefits of physical activity, and encouraged her to engage in regular exercise appropriate for her age and condition.    Problem List Items Addressed This Visit       Cardiovascular and Mediastinum    BP (high blood pressure)   Mixed dementia (HCC)   Relevant Medications   mirtazapine (REMERON) 15 MG tablet     Endocrine   Subclinical hyperthyroidism   Relevant Orders   TSH + free T4   Multinodular goiter   Relevant Orders   TSH + free T4     Genitourinary   Stage 3b chronic kidney disease (HCC)     Other   Hypercholesteremia   Relevant Orders   Lipid panel   Hepatic function panel   MDD (major depressive disorder)   Relevant Medications   mirtazapine (REMERON) 15 MG tablet   Underweight   Other Visit Diagnoses     Encounter for annual wellness visit (AWV) in Medicare patient    -  Primary   Encounter for annual physical exam       Relevant Orders   Lipid panel   Hepatic function panel   TSH + free T4           Hypertension: Well controlled on Spironolactone 25mg  daily. Slightly elevated today - monitor home BPs if able -Continue current management.  Dementia: On Aricept 10mg  QHS. -Continue current management.  Major Depressive Disorder: On Celexa 20mg  daily. -Continue current management.  Hyperlipidemia: On Lipitor 40mg  daily. -Continue current management.  Chronic Kidney Disease (Stage 3b): No new complaints or concerns. -Continue current management. - f/b nephrology - reviewed recent labs  General Health Maintenance / Followup Plans: -Continue current medications and management. -Annual wellness visit and physical completed. No new concerns or changes in management. - discussed vaccine recommendations        Return in about 6 months (around 05/16/2023) for chronic disease f/u.     [name], MD

## 2022-11-14 LAB — TSH+FREE T4
Free T4: 1.41 ng/dL (ref 0.82–1.77)
TSH: 0.86 u[IU]/mL (ref 0.450–4.500)

## 2022-11-14 LAB — LIPID PANEL
Chol/HDL Ratio: 2.1 ratio (ref 0.0–4.4)
Cholesterol, Total: 142 mg/dL (ref 100–199)
HDL: 67 mg/dL (ref >39)
LDL Chol Calc (NIH): 53 mg/dL (ref 0–99)
Triglycerides: 125 mg/dL (ref 0–149)
VLDL Cholesterol Cal: 22 mg/dL (ref 5–40)

## 2022-11-14 LAB — HEPATIC FUNCTION PANEL
ALT: 11 IU/L (ref 0–32)
AST: 16 IU/L (ref 0–40)
Albumin: 4.6 g/dL (ref 3.6–4.6)
Alkaline Phosphatase: 119 IU/L (ref 44–121)
Bilirubin Total: 0.4 mg/dL (ref 0.0–1.2)
Bilirubin, Direct: 0.12 mg/dL (ref 0.00–0.40)
Total Protein: 7.1 g/dL (ref 6.0–8.5)

## 2022-12-08 DIAGNOSIS — M5136 Other intervertebral disc degeneration, lumbar region: Secondary | ICD-10-CM | POA: Diagnosis not present

## 2022-12-08 DIAGNOSIS — M5416 Radiculopathy, lumbar region: Secondary | ICD-10-CM | POA: Diagnosis not present

## 2022-12-08 DIAGNOSIS — Z79899 Other long term (current) drug therapy: Secondary | ICD-10-CM | POA: Diagnosis not present

## 2022-12-08 DIAGNOSIS — M48062 Spinal stenosis, lumbar region with neurogenic claudication: Secondary | ICD-10-CM | POA: Diagnosis not present

## 2022-12-13 ENCOUNTER — Other Ambulatory Visit: Payer: Self-pay | Admitting: Family Medicine

## 2023-01-06 DIAGNOSIS — F0390 Unspecified dementia without behavioral disturbance: Secondary | ICD-10-CM | POA: Diagnosis not present

## 2023-01-06 DIAGNOSIS — Z515 Encounter for palliative care: Secondary | ICD-10-CM | POA: Diagnosis not present

## 2023-01-06 DIAGNOSIS — I739 Peripheral vascular disease, unspecified: Secondary | ICD-10-CM | POA: Diagnosis not present

## 2023-01-06 DIAGNOSIS — N1832 Chronic kidney disease, stage 3b: Secondary | ICD-10-CM | POA: Diagnosis not present

## 2023-01-06 DIAGNOSIS — I129 Hypertensive chronic kidney disease with stage 1 through stage 4 chronic kidney disease, or unspecified chronic kidney disease: Secondary | ICD-10-CM | POA: Diagnosis not present

## 2023-01-06 DIAGNOSIS — Z681 Body mass index (BMI) 19 or less, adult: Secondary | ICD-10-CM | POA: Diagnosis not present

## 2023-01-06 DIAGNOSIS — Z7982 Long term (current) use of aspirin: Secondary | ICD-10-CM | POA: Diagnosis not present

## 2023-01-09 ENCOUNTER — Other Ambulatory Visit: Payer: Self-pay | Admitting: Family Medicine

## 2023-01-11 NOTE — Telephone Encounter (Signed)
Requested Prescriptions  Pending Prescriptions Disp Refills   spironolactone (ALDACTONE) 25 MG tablet [Pharmacy Med Name: SPIRONOLACTONE 25 MG TABLET] 90 tablet 0    Sig: TAKE 1 TABLET BY MOUTH EVERY DAY     Cardiovascular: Diuretics - Aldosterone Antagonist Failed - 01/09/2023  8:41 AM      Failed - Cr in normal range and within 180 days    Creat  Date Value Ref Range Status  03/11/2017 1.42 (H) 0.60 - 0.88 mg/dL Final    Comment:    For patients >56 years of age, the reference limit for Creatinine is approximately 13% higher for people identified as African-American. .    Creatinine, Ser  Date Value Ref Range Status  04/13/2022 1.43 (H) 0.57 - 1.00 mg/dL Final         Failed - K in normal range and within 180 days    Potassium  Date Value Ref Range Status  04/13/2022 5.0 3.5 - 5.2 mmol/L Final         Failed - Na in normal range and within 180 days    Sodium  Date Value Ref Range Status  04/13/2022 140 134 - 144 mmol/L Final         Failed - eGFR is 30 or above and within 180 days    GFR, Est African American  Date Value Ref Range Status  03/11/2017 38 (L) > OR = 60 mL/min/1.64m2 Final   GFR calc Af Amer  Date Value Ref Range Status  03/14/2020 36 (L) >59 mL/min/1.73 Final    Comment:    **In accordance with recommendations from the NKF-ASN Task force,**   [facility] is in the process of updating its eGFR calculation to the   2021 CKD-EPI creatinine equation that estimates kidney function   without a race variable.    GFR, Est Non African American  Date Value Ref Range Status  03/11/2017 33 (L) > OR = 60 mL/min/1.58m2 Final   GFR, Estimated  Date Value Ref Range Status  12/17/2021 41 (L) >60 mL/min Final    Comment:    (NOTE) Calculated using the CKD-EPI Creatinine Equation (2021)    eGFR  Date Value Ref Range Status  04/13/2022 34 (L) >59 mL/min/1.73 Final         Failed - Last BP in normal range    BP Readings from Last 1 Encounters:  11/13/22 (!)  154/50         Passed - Valid encounter within last 6 months    Recent Outpatient Visits           1 month ago Encounter for annual wellness visit (AWV) in Medicare patient   [place] [facility] [place], Marzella Schlein, MD   4 months ago Mixed dementia [facility])   [place] [facility], Marzella Schlein, MD   8 months ago History of UTI   [facility] [place], Marzella Schlein, MD   9 months ago Altered mental status, unspecified altered mental status type   [facility] Caro Laroche, DO   9 months ago Hypercholesteremia   [place] [facility] [place], Marzella Schlein, MD       Future Appointments             In 4 months [place], Marzella Schlein, MD [facility]

## 2023-01-15 ENCOUNTER — Ambulatory Visit: Payer: Self-pay | Admitting: *Deleted

## 2023-01-15 NOTE — Telephone Encounter (Signed)
Summary: medication dosage   mirtazapine (REMERON) 15 MG tablet ...  Please call Rosey Bath she has questions regarding the dosage of her mothers medication   30mg  or 15mg ?     Reason for Disposition . Caller has medicine question only, adult not sick, AND triager answers question  Answer Assessment - Initial Assessment Questions 1. NAME of MEDICINE: "What medicine(s) are you calling about?"     mirtazapine (REMERON) 15 MG tablet 2. QUESTION: "What is your question?" (e.g., double dose of medicine, side effect)     Daughter- Teresa(DRR) wants to verify dosing 3. PRESCRIBER: "Who prescribed the medicine?" Reason: if prescribed by specialist, call should be referred to that group.     PCP 4. SYMPTOMS: "Do you have any symptoms?" If Yes, ask: "What symptoms are you having?"  "How bad are the symptoms (e.g., mild, moderate, severe)     No changes  Notified of dosing listed in chart: mirtazapine (REMERON) 15 MG tablet  Protocols used: Medication Question Call-A-AH

## 2023-01-15 NOTE — Telephone Encounter (Signed)
  Chief Complaint: Dose verification:mirtazapine (REMERON) 15 MG tablet  Symptoms: no change   Disposition: [] ED /[] Urgent Care (no appt availability in office) / [] Appointment(In office/virtual)/ []  [place] [facility]/ [x] Home Care/ [] Refused Recommended Disposition /[] [place] [facility]/ []  Follow-up with PCP Additional Notes: Patient's daughter-Teresa is calling to verify dosing on medication- just thought it was 30mg - but verified dose of 15mg  is correct-has been longer term dose without change per chart, She states that is fine just checking- reports no changes.

## 2023-01-18 ENCOUNTER — Emergency Department
Admission: EM | Admit: 2023-01-18 | Discharge: 2023-01-18 | Disposition: A | Discharge status: Home / Self Care | Payer: PPO | Attending: Emergency Medicine | Admitting: Emergency Medicine

## 2023-01-18 ENCOUNTER — Emergency Department: Payer: PPO

## 2023-01-18 ENCOUNTER — Other Ambulatory Visit: Payer: Self-pay

## 2023-01-18 DIAGNOSIS — R29898 Other symptoms and signs involving the musculoskeletal system: Secondary | ICD-10-CM | POA: Diagnosis not present

## 2023-01-18 DIAGNOSIS — M549 Dorsalgia, unspecified: Secondary | ICD-10-CM | POA: Diagnosis not present

## 2023-01-18 DIAGNOSIS — E860 Dehydration: Secondary | ICD-10-CM | POA: Diagnosis not present

## 2023-01-18 DIAGNOSIS — I6782 Cerebral ischemia: Secondary | ICD-10-CM | POA: Diagnosis not present

## 2023-01-18 DIAGNOSIS — I672 Cerebral atherosclerosis: Secondary | ICD-10-CM | POA: Diagnosis not present

## 2023-01-18 DIAGNOSIS — G319 Degenerative disease of nervous system, unspecified: Secondary | ICD-10-CM | POA: Diagnosis not present

## 2023-01-18 DIAGNOSIS — I700 Atherosclerosis of aorta: Secondary | ICD-10-CM | POA: Diagnosis not present

## 2023-01-18 DIAGNOSIS — E785 Hyperlipidemia, unspecified: Secondary | ICD-10-CM | POA: Insufficient documentation

## 2023-01-18 DIAGNOSIS — W19XXXA Unspecified fall, initial encounter: Secondary | ICD-10-CM

## 2023-01-18 DIAGNOSIS — I10 Essential (primary) hypertension: Secondary | ICD-10-CM | POA: Diagnosis not present

## 2023-01-18 DIAGNOSIS — I517 Cardiomegaly: Secondary | ICD-10-CM | POA: Diagnosis not present

## 2023-01-18 DIAGNOSIS — R918 Other nonspecific abnormal finding of lung field: Secondary | ICD-10-CM | POA: Diagnosis not present

## 2023-01-18 DIAGNOSIS — Z043 Encounter for examination and observation following other accident: Secondary | ICD-10-CM | POA: Diagnosis not present

## 2023-01-18 LAB — CBC WITH DIFFERENTIAL/PLATELET
Abs Immature Granulocytes: 0.04 10*3/uL (ref 0.00–0.07)
Basophils Absolute: 0 10*3/uL (ref 0.0–0.1)
Basophils Relative: 0 %
Eosinophils Absolute: 0 10*3/uL (ref 0.0–0.5)
Eosinophils Relative: 0 %
HCT: 44.3 % (ref 36.0–46.0)
Hemoglobin: 14.1 g/dL (ref 12.0–15.0)
Immature Granulocytes: 0 %
Lymphocytes Relative: 11 %
Lymphs Abs: 1 10*3/uL (ref 0.7–4.0)
MCH: 31.1 pg (ref 26.0–34.0)
MCHC: 31.8 g/dL (ref 30.0–36.0)
MCV: 97.8 fL (ref 80.0–100.0)
Monocytes Absolute: 0.5 10*3/uL (ref 0.1–1.0)
Monocytes Relative: 6 %
Neutro Abs: 7.7 10*3/uL (ref 1.7–7.7)
Neutrophils Relative %: 83 %
Platelets: 200 10*3/uL (ref 150–400)
RBC: 4.53 MIL/uL (ref 3.87–5.11)
RDW: 12.3 % (ref 11.5–15.5)
WBC: 9.3 10*3/uL (ref 4.0–10.5)
nRBC: 0 % (ref 0.0–0.2)

## 2023-01-18 LAB — COMPREHENSIVE METABOLIC PANEL
ALT: 14 U/L (ref 0–44)
AST: 24 U/L (ref 15–41)
Albumin: 4.2 g/dL (ref 3.5–5.0)
Alkaline Phosphatase: 94 U/L (ref 38–126)
Anion gap: 13 (ref 5–15)
BUN: 39 mg/dL — ABNORMAL HIGH (ref 8–23)
CO2: 22 mmol/L (ref 22–32)
Calcium: 9.2 mg/dL (ref 8.9–10.3)
Chloride: 102 mmol/L (ref 98–111)
Creatinine, Ser: 1.5 mg/dL — ABNORMAL HIGH (ref 0.44–1.00)
GFR, Estimated: 32 mL/min — ABNORMAL LOW (ref >60)
Glucose, Bld: 107 mg/dL — ABNORMAL HIGH (ref 70–99)
Potassium: 4.5 mmol/L (ref 3.5–5.1)
Sodium: 137 mmol/L (ref 135–145)
Total Bilirubin: 0.9 mg/dL (ref 0.3–1.2)
Total Protein: 7.5 g/dL (ref 6.5–8.1)

## 2023-01-18 LAB — URINALYSIS, ROUTINE W REFLEX MICROSCOPIC
Bilirubin Urine: NEGATIVE
Glucose, UA: NEGATIVE mg/dL
Hgb urine dipstick: NEGATIVE
Ketones, ur: 5 mg/dL — AB
Leukocytes,Ua: NEGATIVE
Nitrite: NEGATIVE
Protein, ur: NEGATIVE mg/dL
Specific Gravity, Urine: 1.015 (ref 1.005–1.030)
pH: 5 (ref 5.0–8.0)

## 2023-01-18 LAB — TROPONIN I (HIGH SENSITIVITY): Troponin I (High Sensitivity): 8 ng/L (ref <18)

## 2023-01-18 LAB — CK: Total CK: 156 U/L (ref 38–234)

## 2023-01-18 MED ORDER — SODIUM CHLORIDE 0.9 % IV BOLUS
1000.0000 mL | Freq: Once | INTRAVENOUS | Status: AC
  Administered 2023-01-18: 1000 mL via INTRAVENOUS

## 2023-01-18 MED ORDER — LABETALOL HCL 5 MG/ML IV SOLN: 10.0000 mg | Freq: Once | INTRAVENOUS | Status: DC

## 2023-01-18 NOTE — ED Triage Notes (Signed)
Pt presents via EMS c/o fall last night at home. Pt reports fell sometime last night, unknown time. EMS reports pt called neighbor this am to help get out of the floor. C/o mid back pain. Denies LOC. Denies hitting head.

## 2023-01-18 NOTE — ED Provider Notes (Signed)
[facility] Provider Note    Event Date/Time   First MD Initiated Contact with Patient 01/18/23 [phone]     (approximate)   History   Fall   HPI  Becky Prince is a 89 y.o. female  with PMHx HTN, HLD, here with fall. Pt reportedly fell at some point in the last 24 hours. She says she was watching TV late at night, had to go to the bathroom, and fell at some point. She doesn't remember many details of what caused her to fall. She says she was too weak to get back up so she just rested on the floor. Denies any head injury. No hip or other pain. Pt reports she has not been ill. She reportedly had urinated several times, and was found down by neighbors who came to check on her this AM. Denies urinary sx. No known recent illnesses. She does not take blood thinners.       Physical Exam   Triage Vital Signs: ED Triage Vitals  Encounter Vitals Group     BP      Systolic BP Percentile      Diastolic BP Percentile      Pulse      Resp      Temp      Temp src      SpO2      Weight      Height      Head Circumference      Peak Flow      Pain Score      Pain Loc      Pain Education      Exclude from Growth Chart     Most recent vital signs: Vitals:   01/18/23 1100 01/18/23 1130  BP: (!) 189/79 (!) 174/79  Pulse: 81 80  Resp: 16 18  Temp:    SpO2: 98% 96%     General: Awake, no distress.  CV:  Good peripheral perfusion. RRR. Resp:  Normal work of breathing. Lungs clear bilaterally. Abd:  No distention. No tenderness. Other:  Alert, oriented to person, place, month but not exact date. CNII-XII intact. Strength 5/5 bl UE and LE. Normal sensation to light touch.   ED Results / Procedures / Treatments   Labs (all labs ordered are listed, but only abnormal results are displayed) Labs Reviewed  COMPREHENSIVE METABOLIC PANEL - Abnormal; Notable for the following components:      Result Value   Glucose, Bld 107 (*)    BUN 39 (*)    Creatinine,  Ser 1.50 (*)    GFR, Estimated 32 (*)    All other components within normal limits  URINALYSIS, ROUTINE W REFLEX MICROSCOPIC - Abnormal; Notable for the following components:   Color, Urine YELLOW (*)    APPearance CLEAR (*)    Ketones, ur 5 (*)    All other components within normal limits  CBC WITH DIFFERENTIAL/PLATELET  CK  TROPONIN I (HIGH SENSITIVITY)  TROPONIN I (HIGH SENSITIVITY)     EKG    RADIOLOGY CT Head: No acute abnormality CT C Spine: No acute abnormality CXR: chronic interstitial thickening, no acute abnormality   I also independently reviewed and agree with radiologist interpretations.   PROCEDURES:  Critical Care performed: No  .1-3 Lead EKG Interpretation  Performed by: Shaune Pollack, MD Authorized by: Shaune Pollack, MD     Interpretation: normal     ECG rate:  70-90   ECG rate assessment: normal  Rhythm: sinus rhythm     Ectopy: none     Conduction: normal   Comments:     Indication: Fall     MEDICATIONS ORDERED IN ED: Medications  labetalol (NORMODYNE) injection 10 mg (has no administration in time range)  sodium chloride 0.9 % bolus 1,000 mL (0 mLs Intravenous Stopped 01/18/23 1128)     IMPRESSION / MDM / ASSESSMENT AND PLAN / ED COURSE  I reviewed the triage vital signs and the nursing notes.                              Differential diagnosis includes, but is not limited to, mechanical fall, fall 2/2 weakness from occult UTI, PNA, anemia, electrolyte abnormality, TBI, ICH, SDH  Patient's presentation is most consistent with acute presentation with potential threat to life or bodily function.  The patient is on the cardiac monitor to evaluate for evidence of arrhythmia and/or significant heart rate changes  89 yo F here with fall. Fall seems likely mechanical in nature while walking at night, but ddx is broad. Pt was down for at least 6-8 hours. She has no signs of trauma on exam. CT Head/C Spine obtained, and are negative.  CXR clear. No hip or extremity pain. Pt labs are veyr reassuring, with mild dehydration but o/w unremarkable. EKG is nonischemic, denies CP, and VS are stable. Mild HTN noted likely b/c pt has not had meds today. No sx of HTN Urgency.  Labs are o/w veyr reassuring. Cr at baseline. CBC shows no leukocytosis or anemia. CXR is unremarkable.   Pt feels better/at baseline, is ambulatory w/o difficulty, and tolerating PO. She will be discharged with neighbors who can check in on her.   FINAL CLINICAL IMPRESSION(S) / ED DIAGNOSES   Final diagnoses:  Fall, initial encounter  Mild dehydration     Rx / DC Orders   ED Discharge Orders     None        Note:  This document was prepared using Dragon voice recognition software and may include unintentional dictation errors.   Shaune Pollack, MD 01/18/23 1154

## 2023-01-21 ENCOUNTER — Ambulatory Visit: Payer: Self-pay

## 2023-01-21 ENCOUNTER — Telehealth: Payer: Self-pay | Admitting: Family Medicine

## 2023-01-21 DIAGNOSIS — F0390 Unspecified dementia without behavioral disturbance: Secondary | ICD-10-CM | POA: Diagnosis not present

## 2023-01-21 DIAGNOSIS — Z9181 History of falling: Secondary | ICD-10-CM | POA: Diagnosis not present

## 2023-01-21 NOTE — Telephone Encounter (Signed)
Patient's daughter called back -please see other note

## 2023-01-21 NOTE — Telephone Encounter (Signed)
  Chief Complaint: Sever pain following fall and 6-8 hours on the floor Symptoms: Bruising Frequency: Early morning Monday Pertinent Negatives: Patient denies  Disposition: [] ED /[] Urgent Care (no appt availability in office) / [] Appointment(In office/virtual)/ []  [place] [facility]/ [] Home Care/ [] Refused Recommended Disposition /[] [place] [facility]/ []  Follow-up with PCP Additional Notes: Spoke with pt's daughter Becky Prince. Pt fell early Monday morning. Pt was on the floor for 6-8 hours. Pt went to ED and released. Daughter states that pt is bruised and in a lot of pain. Daughter has increased pain medication not beyond what is prescribed, w/o relief. Pt is not eating much at all. There is some concern about internal injury.  Daughter would like pt to be worked in for today. No appts in office or at [facility]. Made an appt for tomorrow.  Daughter will need a 1 hour lead time to get pt to the office.  Please call daughter to advise if pt will be worked in.    Reason for Disposition  [1] Fall AND [2] went to emergency department for evaluation or treatment  Answer Assessment - Initial Assessment Questions 1. MECHANISM: "How did the fall happen?"     Most likely tripped 3. ONSET: "When did the fall happen?" (e.g., minutes, hours, or days ago)     Monday early morning 4. LOCATION: "What part of the body hit the ground?" (e.g., back, buttocks, head, hips, knees, hands, head, stomach)     unsure 5. INJURY: "Did you hurt (injure) yourself when you fell?" If Yes, ask: "What did you injure? Tell me more about this?" (e.g., body area; type of injury; pain severity)"     yes 6. PAIN: "Is there any pain?" If Yes, ask: "How bad is the pain?" (e.g., Scale 1-10; or mild,  moderate, severe)   - NONE (0): No pain   - MILD (1-3): Doesn't interfere with normal activities    - MODERATE (4-7): Interferes with normal activities or awakens from sleep    - SEVERE (8-10): Excruciating pain, unable  to do any normal activities      severe 7. SIZE: For cuts, bruises, or swelling, ask: "How large is it?" (e.g., inches or centimeters)      Bruising on back 9. OTHER SYMPTOMS: "Do you have any other symptoms?" (e.g., dizziness, fever, weakness; new onset or worsening).       10. CAUSE: "What do you think caused the fall (or falling)?" (e.g., tripped, dizzy spell)       tripped  Protocols used: Falls and [facility]

## 2023-01-21 NOTE — Telephone Encounter (Signed)
Pt's daughter Rosey Bath is calling in because she called in earlier and spoke with Triage nurse Paulette. Pt says she received a call from Josie stating pt needed to go to the ER and have an Xray done. Rosey Bath says pt has already been to the ER and had Xrays done and the information should be within the pt's chart. Rosey Bath says she will keep the appointment pt has and speak with the provider she sees regarding pt's pain.

## 2023-01-21 NOTE — Telephone Encounter (Signed)
LM on voicemail for Becky Prince patient's daughter with provider message. Ok for [facility] Nurse to give providers message if patient calls back.

## 2023-01-21 NOTE — Telephone Encounter (Signed)
She needs to go to ER. She needs xrays, maybe CT done, and if we order them we won't have results for a week.

## 2023-01-22 ENCOUNTER — Ambulatory Visit: Payer: PPO | Admitting: Family Medicine

## 2023-01-23 ENCOUNTER — Other Ambulatory Visit: Payer: Self-pay

## 2023-01-23 ENCOUNTER — Emergency Department: Payer: PPO

## 2023-01-23 ENCOUNTER — Emergency Department
Admission: EM | Admit: 2023-01-23 | Discharge: 2023-01-23 | Disposition: A | Discharge status: Home / Self Care | Payer: PPO | Attending: Emergency Medicine | Admitting: Emergency Medicine

## 2023-01-23 DIAGNOSIS — S3992XA Unspecified injury of lower back, initial encounter: Secondary | ICD-10-CM | POA: Diagnosis present

## 2023-01-23 DIAGNOSIS — I129 Hypertensive chronic kidney disease with stage 1 through stage 4 chronic kidney disease, or unspecified chronic kidney disease: Secondary | ICD-10-CM | POA: Insufficient documentation

## 2023-01-23 DIAGNOSIS — N1832 Chronic kidney disease, stage 3b: Secondary | ICD-10-CM | POA: Insufficient documentation

## 2023-01-23 DIAGNOSIS — R109 Unspecified abdominal pain: Secondary | ICD-10-CM | POA: Diagnosis not present

## 2023-01-23 DIAGNOSIS — F0390 Unspecified dementia without behavioral disturbance: Secondary | ICD-10-CM | POA: Insufficient documentation

## 2023-01-23 DIAGNOSIS — S32010A Wedge compression fracture of first lumbar vertebra, initial encounter for closed fracture: Secondary | ICD-10-CM | POA: Diagnosis not present

## 2023-01-23 DIAGNOSIS — M4854XA Collapsed vertebra, not elsewhere classified, thoracic region, initial encounter for fracture: Secondary | ICD-10-CM | POA: Diagnosis not present

## 2023-01-23 DIAGNOSIS — S22060A Wedge compression fracture of T7-T8 vertebra, initial encounter for closed fracture: Secondary | ICD-10-CM | POA: Diagnosis not present

## 2023-01-23 DIAGNOSIS — J9811 Atelectasis: Secondary | ICD-10-CM | POA: Diagnosis not present

## 2023-01-23 DIAGNOSIS — J90 Pleural effusion, not elsewhere classified: Secondary | ICD-10-CM | POA: Diagnosis not present

## 2023-01-23 DIAGNOSIS — W19XXXA Unspecified fall, initial encounter: Secondary | ICD-10-CM | POA: Insufficient documentation

## 2023-01-23 DIAGNOSIS — I700 Atherosclerosis of aorta: Secondary | ICD-10-CM | POA: Diagnosis not present

## 2023-01-23 DIAGNOSIS — M549 Dorsalgia, unspecified: Secondary | ICD-10-CM | POA: Diagnosis not present

## 2023-01-23 DIAGNOSIS — K7689 Other specified diseases of liver: Secondary | ICD-10-CM | POA: Diagnosis not present

## 2023-01-23 DIAGNOSIS — R140 Abdominal distension (gaseous): Secondary | ICD-10-CM | POA: Diagnosis not present

## 2023-01-23 LAB — URINALYSIS, W/ REFLEX TO CULTURE (INFECTION SUSPECTED)
Bacteria, UA: NONE SEEN
Bilirubin Urine: NEGATIVE
Glucose, UA: NEGATIVE mg/dL
Ketones, ur: NEGATIVE mg/dL
Leukocytes,Ua: NEGATIVE
Nitrite: NEGATIVE
Protein, ur: NEGATIVE mg/dL
Specific Gravity, Urine: 1.016 (ref 1.005–1.030)
pH: 5 (ref 5.0–8.0)

## 2023-01-23 LAB — CBC WITH DIFFERENTIAL/PLATELET
Abs Immature Granulocytes: 0.01 10*3/uL (ref 0.00–0.07)
Basophils Absolute: 0 10*3/uL (ref 0.0–0.1)
Basophils Relative: 0 %
Eosinophils Absolute: 0.1 10*3/uL (ref 0.0–0.5)
Eosinophils Relative: 2 %
HCT: 42.9 % (ref 36.0–46.0)
Hemoglobin: 13.9 g/dL (ref 12.0–15.0)
Immature Granulocytes: 0 %
Lymphocytes Relative: 22 %
Lymphs Abs: 1.6 10*3/uL (ref 0.7–4.0)
MCH: 31.7 pg (ref 26.0–34.0)
MCHC: 32.4 g/dL (ref 30.0–36.0)
MCV: 97.7 fL (ref 80.0–100.0)
Monocytes Absolute: 0.6 10*3/uL (ref 0.1–1.0)
Monocytes Relative: 9 %
Neutro Abs: 4.9 10*3/uL (ref 1.7–7.7)
Neutrophils Relative %: 67 %
Platelets: 212 10*3/uL (ref 150–400)
RBC: 4.39 MIL/uL (ref 3.87–5.11)
RDW: 12.1 % (ref 11.5–15.5)
WBC: 7.3 10*3/uL (ref 4.0–10.5)
nRBC: 0 % (ref 0.0–0.2)

## 2023-01-23 LAB — BASIC METABOLIC PANEL
Anion gap: 10 (ref 5–15)
BUN: 22 mg/dL (ref 8–23)
CO2: 26 mmol/L (ref 22–32)
Calcium: 9 mg/dL (ref 8.9–10.3)
Chloride: 99 mmol/L (ref 98–111)
Creatinine, Ser: 1.18 mg/dL — ABNORMAL HIGH (ref 0.44–1.00)
GFR, Estimated: 43 mL/min — ABNORMAL LOW (ref >60)
Glucose, Bld: 108 mg/dL — ABNORMAL HIGH (ref 70–99)
Potassium: 4.3 mmol/L (ref 3.5–5.1)
Sodium: 135 mmol/L (ref 135–145)

## 2023-01-23 LAB — HEPATIC FUNCTION PANEL
ALT: 9 U/L (ref 0–44)
AST: 15 U/L (ref 15–41)
Albumin: 3.6 g/dL (ref 3.5–5.0)
Alkaline Phosphatase: 97 U/L (ref 38–126)
Bilirubin, Direct: <0.1 mg/dL (ref 0.0–0.2)
Total Bilirubin: 0.6 mg/dL (ref 0.3–1.2)
Total Protein: 7.1 g/dL (ref 6.5–8.1)

## 2023-01-23 LAB — LIPASE, BLOOD: Lipase: 28 U/L (ref 11–51)

## 2023-01-23 MED ORDER — OXYCODONE HCL 5 MG PO TABS
5.0000 mg | ORAL_TABLET | Freq: Three times a day (TID) | ORAL | 0 refills | Status: DC | PRN
Start: 2023-01-23 — End: 2023-01-25

## 2023-01-23 MED ORDER — LIDOCAINE 5 % EX PTCH
1.0000 | MEDICATED_PATCH | CUTANEOUS | Status: DC
  Administered 2023-01-23: 1 via TRANSDERMAL
  Filled 2023-01-23: qty 1

## 2023-01-23 MED ORDER — TRAMADOL HCL 50 MG PO TABS
50.0000 mg | ORAL_TABLET | Freq: Once | ORAL | Status: AC
  Administered 2023-01-23: 50 mg via ORAL
  Filled 2023-01-23: qty 1

## 2023-01-23 MED ORDER — IOHEXOL 300 MG/ML  SOLN
50.0000 mL | Freq: Once | INTRAMUSCULAR | Status: AC | PRN
  Administered 2023-01-23: 50 mL via INTRAVENOUS

## 2023-01-23 MED ORDER — DOCUSATE SODIUM 100 MG PO CAPS
100.0000 mg | ORAL_CAPSULE | Freq: Two times a day (BID) | ORAL | 0 refills | Status: AC
Start: 2023-01-23 — End: 2023-02-02

## 2023-01-23 MED ORDER — ACETAMINOPHEN 325 MG PO TABS
650.0000 mg | ORAL_TABLET | Freq: Once | ORAL | Status: AC
  Administered 2023-01-23: 650 mg via ORAL
  Filled 2023-01-23: qty 2

## 2023-01-23 NOTE — Discharge Instructions (Addendum)
You were found to have compression fractures.  Please wear the TLSO brace for extra support. Do not take the oxycodone with other narcotics.  Oxycodone may also increase your fall risk, so please only take this in the presence of a caretaker.  Please return for any new, worsening, or change in symptoms or other concerns.  Please follow-up with Dr. Ernestine Mcmurray with neurosurgery.  It was a pleasure caring for you today.

## 2023-01-23 NOTE — Progress Notes (Signed)
Orthopedic Tech Progress Note Patient Details:  [name] 01-07-1926 [id number]  Stat order for a TLSO back brace called into [facility]. Pt is being discharged.  Patient ID: Becky Prince, female   DOB: [birth year], 89 y.o.   MRN: [id number]  Docia Furl 01/23/2023, 1:54 PM

## 2023-01-23 NOTE — ED Provider Notes (Signed)
[facility] Provider Note    None    (approximate)   History   Back Pain   HPI  Becky Prince is a 89 y.o. female with a past medical history of depression, CKD, dementia, CVA, constipation, hypertension with presents today for evaluation of back pain since a fall that occurred on 01/18/2023.  She has been complaining of back pain in her low back ever since according to her daughter who is with her today.  Patient also feels that her abdomen is more bloated than usual, and daughter notes that she has not had a bowel movement in the past few days.  She has not had any vomiting.  She has not had any fevers or chills.  She is still able to ambulate, though has pain with ambulation.  She has not had any urinary or fecal incontinence.  Patient Active Problem List   Diagnosis Date Noted   Postnasal drip 09/08/2022   Underweight 03/31/2022   Imbalance 03/31/2022   MDD (major depressive disorder) 09/29/2021   Bilateral hip pain 09/10/2020   Stage 3b chronic kidney disease (HCC) 09/10/2020   Bilateral pubic rami fractures (HCC) 10/03/2019   Night sweats 02/13/2019   Mixed dementia (HCC) 03/17/2018   Dupuytren's contracture of both hands 03/17/2018   Cerebrovascular disease 10/06/2016   Multinodular goiter 10/06/2016   TIA (transient ischemic attack) 09/28/2016   Osteoporosis 10/14/2015   Constipation 04/12/2015   Contracture of finger joint 04/12/2015   Hypercholesteremia 04/12/2015   BP (high blood pressure) 04/12/2015   Allergic rhinitis, seasonal 04/12/2015   Subclinical hyperthyroidism 11/16/2014          Physical Exam   Triage Vital Signs: ED Triage Vitals  Encounter Vitals Group     BP      Systolic BP Percentile      Diastolic BP Percentile      Pulse      Resp      Temp      Temp src      SpO2      Weight      Height      Head Circumference      Peak Flow      Pain Score      Pain Loc      Pain Education      Exclude from Growth  Chart     Most recent vital signs: Vitals:   01/23/23 0920 01/23/23 1100  BP: (!) 181/101 (!) 185/74  Pulse: 79 66  Resp: 18 18  Temp:    SpO2: 96% 100%    Physical Exam Vitals and nursing note reviewed.  Constitutional:      General: Awake and alert. No acute distress.    Appearance: Normal appearance. The patient is normal weight.  HENT:     Head: Normocephalic and atraumatic.     Mouth: Mucous membranes are moist.  Eyes:     General: PERRL. Normal EOMs        Right eye: No discharge.        Left eye: No discharge.     Conjunctiva/sclera: Conjunctivae normal.  Cardiovascular:     Rate and Rhythm: Normal rate and regular rhythm.     Pulses: Normal pulses.  Equal in all 4 extremities Pulmonary:     Effort: Pulmonary effort is normal. No respiratory distress.     Breath sounds: Normal breath sounds.  No tenderness to palpation to bilateral ribs Abdominal:  Abdomen is soft. There is no abdominal tenderness. No rebound or guarding. No distention. Musculoskeletal:        General: No swelling. Normal range of motion.     Cervical back: Normal range of motion and neck supple. No midline cervical spine tenderness.  Full range of motion of neck.  Negative Spurling test.  Negative Lhermitte sign.  Normal strength and sensation in bilateral upper extremities. Normal grip strength bilaterally.  Normal intrinsic muscle function of the hand bilaterally.  Normal radial pulses bilaterally. Back: Mild lumbar and thoracic midline tenderness. Strength and sensation 5/5 to bilateral lower extremities. Normal great toe extension against resistance. Normal sensation throughout feet. Normal patellar reflexes. Negative SLR and opposite SLR bilaterally.  Pelvis is stable.  Negative logroll bilaterally.  Able to flex bilateral hips and lift legs several feet off of the stretcher Skin:    General: Skin is warm and dry.     Capillary Refill: Capillary refill takes less than 2 seconds.      Findings: No rash.  Neurological:     Mental Status: The patient is awake and alert.   Neurological: GCS 15 alert and oriented x3 Normal speech, no expressive or receptive aphasia or dysarthria Cranial nerves II through XII intact Normal visual fields 5 out of 5 strength in all 4 extremities with intact sensation throughout No extremity drift Normal finger-to-nose testing, no limb or truncal ataxia   ED Results / Procedures / Treatments   Labs (all labs ordered are listed, but only abnormal results are displayed) Labs Reviewed  BASIC METABOLIC PANEL - Abnormal; Notable for the following components:      Result Value   Glucose, Bld 108 (*)    Creatinine, Ser 1.18 (*)    GFR, Estimated 43 (*)    All other components within normal limits  URINALYSIS, W/ REFLEX TO CULTURE (INFECTION SUSPECTED) - Abnormal; Notable for the following components:   Color, Urine YELLOW (*)    APPearance CLEAR (*)    Hgb urine dipstick SMALL (*)    All other components within normal limits  CBC WITH DIFFERENTIAL/PLATELET  HEPATIC FUNCTION PANEL  LIPASE, BLOOD     EKG     RADIOLOGY I independently reviewed and interpreted imaging and agree with radiologists findings.     PROCEDURES:  Critical Care performed:   Procedures   MEDICATIONS ORDERED IN ED: Medications  lidocaine (LIDODERM) 5 % 1 patch (1 patch Transdermal Patch Applied 01/23/23 0947)  acetaminophen (TYLENOL) tablet 650 mg (650 mg Oral Given 01/23/23 0947)  iohexol (OMNIPAQUE) 300 MG/ML solution 50 mL (50 mLs Intravenous Contrast Given 01/23/23 1128)  traMADol (ULTRAM) tablet 50 mg (50 mg Oral Given 01/23/23 1311)     IMPRESSION / MDM / ASSESSMENT AND PLAN / ED COURSE  I reviewed the triage vital signs and the nursing notes.   Differential diagnosis includes, but is not limited to, vertebral fracture, visceral organ injury, constipation.  Patient is awake and alert, hemodynamically stable and afebrile.  She is nontoxic in  appearance.  I reviewed the patient's chart.  She was seen on 01/18/2023 after a fall.  She had labs which are reassuring, blood CT head, neck, and x-ray which were all reassuring.  She was ultimately discharged.  He has negative logroll bilaterally, do not suspect hip fracture.  She is able to flex bilateral hips and lift her leg up off of the stretcher on both sides without pain.  She has mild tenderness to palpation throughout her lumbar spine.  She has equal pulses in all 4 extremities, no chest pain or upper back pain to suggest AAA.  She is not anticoagulated, do not feel exam is consistent with an epidural abscess given that she has no signs or symptoms of cauda equina or cord compression.  No headache, vomiting, or focal neurological deficits to suggest delayed head bleed.  Further workup is indicated.  IV was established and labs were obtained which are overall reassuring.  CT thoracic spine, lumbar spine, and abdomen and pelvis obtained and reveal compression fractures of T8 with 50% height loss and 3 mm retropulsion, T7 compression fracture, and L1 compression fracture.  These were discussed with Dr. Ernestine Mcmurray with neurosurgery who recommends TLSO brace.  I discussed these findings with the patient and her daughter who was at the bedside who agrees to follow-up with Dr. Ernestine Mcmurray next week.  Patient was treated symptomatically with good effect.  She was given a prescription for oxycodone, though advised that she should not take this with other narcotics and may increase her fall risk, therefore she should only take in the presence of a caretaker.  She was instructed that she may use Tylenol 650 mg every 6-8 hours for breakthrough pain, though to pay attention to see if some of her other prescribed medications have Tylenol in it already.  She was also given a prescription for stool softener with the oxycodone. We did discuss very strict return precautions in the meantime.  Patient and daughter  understand and agree with plan.   Patient's presentation is most consistent with acute complicated illness / injury requiring diagnostic workup.   Clinical Course as of 01/23/23 1502  Sat Jan 23, 2023  1309 Discussed with Dr. Ernestine Mcmurray with neurosurgery who agrees with TLSO brace and follow-up in his clinic [JP]    Clinical Course User Index [JP] Kesia Dalto, Herb Grays, PA-C     FINAL CLINICAL IMPRESSION(S) / ED DIAGNOSES   Final diagnoses:  Closed wedge compression fracture of T8 vertebra, initial encounter (HCC)  Closed wedge compression fracture of T7 vertebra, initial encounter (HCC)  Closed compression fracture of body of L1 vertebra (HCC)     Rx / DC Orders   ED Discharge Orders          Ordered    oxyCODONE (ROXICODONE) 5 MG immediate release tablet  Every 8 hours PRN        01/23/23 1347    docusate sodium (COLACE) 100 MG capsule  2 times daily        01/23/23 1500             Note:  This document was prepared using Dragon voice recognition software and may include unintentional dictation errors.   Jackelyn Hoehn, PA-C 01/23/23 1502    Merwyn Katos, MD 02/01/23 [phone]

## 2023-01-23 NOTE — ED Triage Notes (Signed)
Pt here with daughter to ER for cont' severe back pain and bloating abd pain. Recently here for fall. Has been taking pain meds given at last d/c without effect on the pain

## 2023-01-25 ENCOUNTER — Telehealth: Payer: Self-pay | Admitting: Neurosurgery

## 2023-01-25 ENCOUNTER — Ambulatory Visit: Payer: Self-pay

## 2023-01-25 ENCOUNTER — Other Ambulatory Visit: Payer: Self-pay

## 2023-01-25 ENCOUNTER — Inpatient Hospital Stay: Payer: PPO | Admitting: Family Medicine

## 2023-01-25 DIAGNOSIS — S22000A Wedge compression fracture of unspecified thoracic vertebra, initial encounter for closed fracture: Secondary | ICD-10-CM

## 2023-01-25 MED ORDER — OXYCODONE HCL 5 MG PO TABS: 5.0000 mg | ORAL_TABLET | Freq: Three times a day (TID) | ORAL | 0 refills | Status: DC | PRN

## 2023-01-25 NOTE — Telephone Encounter (Signed)
01/23/2023 Closed wedge compression fracture of T8 vertebra  Daughter Rosey Bath is calling she is also POA. Patient was in the ER and was told to f/u this week with Dr.Smith. since it is a compression how soon should she be seen? The patient is not wearing her brace because it is very uncomfortable. She only has 2 oxy pills left for today. Would she be able to get a refill from this office of pain medication until her appt with our office?

## 2023-01-25 NOTE — Telephone Encounter (Signed)
  Chief Complaint: Pain - running out of pain medication Symptoms: pain from Fall and compression fractures Frequency: ongoing Pertinent Negatives: Patient denies  Disposition: [] ED /[] Urgent Care (no appt availability in office) / [] Appointment(In office/virtual)/ []  [place] [facility]/ [] Home Care/ [] Refused Recommended Disposition /[] [place] [facility]/ [x]  Follow-up with PCP Additional Notes: Spoke with Becky Prince, pt's daughter. Pt fell on 01/18/2023 and was seen in ED. Pt was released.  Pt returned to ED 01/23/2023 and was found to have compression fractures. Pt was given a small quantity of Oxycodone for pain. Daughter has used them sparingly, but with good results. Pt has an upcoming appt with neuro. Daughter contacted neuro for additional pain medication, however they cannot prescribe as they have not yet sen pt.  Becky Prince is requesting Rx for Oxycodone for pain Pt was told in ED to discontinue tramadol and Hydrocodone. Please advise asap. Return call to daughter Becky Prince.    Reason for Disposition  [1] Prescription refill request for ESSENTIAL medicine (i.e., likelihood of harm to patient if not taken) AND [2] triager unable to refill per department policy  Answer Assessment - Initial Assessment Questions 1. DRUG NAME: "What medicine do you need to have refilled?"     Oxycodone 2. REFILLS REMAINING: "How many refills are remaining?" (Note: The label on the medicine or pill bottle will show how many refills are remaining. If there are no refills remaining, then a renewal may be needed.)     1  4. PRESCRIBING HCP: "Who prescribed it?" Reason: If prescribed by specialist, call should be referred to that group.     ED 5. SYMPTOMS: "Do you have any symptoms?"     pain  Protocols used: Medication Refill and Renewal Call-A-AH

## 2023-01-25 NOTE — Telephone Encounter (Signed)
Called and spoke with Daughter Rosey Bath who  is also POA. Advised per Dr Ernestine Mcmurray she hasn't been seen by [place] yet, so we can't prescribe her medication. Will have to go through PCP. Follow up in 2-4 weeks with xrays , appointment has been scheduled in two weeks with Dr Ernestine Mcmurray on 02/08/23 and xrays orders placed today

## 2023-01-25 NOTE — Telephone Encounter (Signed)
She hasn't been seen by [place] yet, so we can't prescribe her medication. Will have to go through PCP. Follow up in 2-4 weeks with upright xrays.

## 2023-01-25 NOTE — Addendum Note (Signed)
Addended by: Malva Limes on: 01/25/2023 04:11 PM   Modules accepted: Orders

## 2023-01-26 ENCOUNTER — Ambulatory Visit: Payer: Self-pay

## 2023-01-26 NOTE — Telephone Encounter (Signed)
  Chief Complaint: medication questions  Symptoms: NA Frequency:  Pertinent Negatives: NA Disposition: [] ED /[] Urgent Care (no appt availability in office) / [] Appointment(In office/virtual)/ []  [facility]/ [] Home Care/ [] Refused Recommended Disposition /[] [place] [facility]/ [x]  Follow-up with PCP Additional Notes: spoke with pt's daughter Rosey Bath. She is wanting to hold off on pt taking Hydrocodone while pt is taking oxycodone since it is helping with the pain. She is asking tho if 30 DS or 90 DS would be able to be sent to pharmacy for oxycodone since current rx is for 6 DS. She will be going back home for a few days and doesn't want someone else to have to pick up a rx for her. I advised her that I would send a message back to practice and have nurse FU with her. She verbalized understanding.   Summary: med ?'s   Pt's daughter called in askin gif its ok to take Oxycodone with Tramadol, and in general can the Oxycoden be refilled  for more than 6 days at a time.     Reason for Disposition  [1] Caller has URGENT medicine question about med that PCP or specialist prescribed AND [2] triager unable to answer question  Answer Assessment - Initial Assessment Questions 1. NAME of MEDICINE: "What medicine(s) are you calling about?"     Oxycodone  2. QUESTION: "What is your question?" (e.g., double dose of medicine, side effect)     Wanting to hold off on hydrocodone while taking oxycodone  3. PRESCRIBER: "Who prescribed the medicine?" Reason: if prescribed by specialist, call should be referred to that group.     Dr. Sherrie Mustache  4. SYMPTOMS: "Do you have any symptoms?" If Yes, ask: "What symptoms are you having?"  "How bad are the symptoms (e.g., mild, moderate, severe)     NA  Protocols used: Medication Question Call-A-AH

## 2023-01-28 ENCOUNTER — Telehealth: Payer: Self-pay | Admitting: Family Medicine

## 2023-01-28 DIAGNOSIS — S32010D Wedge compression fracture of first lumbar vertebra, subsequent encounter for fracture with routine healing: Secondary | ICD-10-CM | POA: Diagnosis not present

## 2023-01-28 DIAGNOSIS — S22060D Wedge compression fracture of T7-T8 vertebra, subsequent encounter for fracture with routine healing: Secondary | ICD-10-CM | POA: Diagnosis not present

## 2023-01-28 NOTE — Telephone Encounter (Signed)
For acute pain, such as injury, there is a prescribing limit for 5 day supply.  If it becomes chronic, can reassess for longer supply. Ok to take tramadol for milder pain and Oxycodone for severe pain if needed.

## 2023-01-28 NOTE — Telephone Encounter (Signed)
Opioids can cause severe constipation as well. She may need to take a stool softener (Colace) or Miralax when she takes Oxy as well.

## 2023-01-28 NOTE — Telephone Encounter (Signed)
Daughter reports that she is already doing all of these. That's why she decided just to give it at night to help with her sleep. Reports that she reached to [facility] [employer] and [facility] with [facility] is going to reach out to [place] for the prescription.

## 2023-01-28 NOTE — Telephone Encounter (Signed)
Home Health Verbal Orders - Caller/Agency: Gearldine Bienenstock with [facility]  Callback Number: [phone]  Requesting OT/PT/Skilled Nursing/Social Work/Speech Therapy: Home health nursing care  Frequency: The doctor can decide

## 2023-01-28 NOTE — Telephone Encounter (Signed)
Spoke with patient's daughter Rosey Bath. Gave providers message. Rosey Bath want provider to know that she realized in the last couple of days that Opioids can cause severe gastric pain. She has decided to only give Oxy at night to help her sleep. Reports that they do not need any Oxy at the moment. Reports side effect are awful.

## 2023-01-28 NOTE — Telephone Encounter (Signed)
OK for verbals

## 2023-01-29 NOTE — Telephone Encounter (Signed)
Noted. Will discuss at upcoming visit

## 2023-01-29 NOTE — Telephone Encounter (Signed)
Called to give verbal order to [place] with [facility] which reports that they do not do Home health care that the order needs to go to [facility]. Provider to place order to [facility] or other local agency.   Patient is scheduled to see you 10/10

## 2023-01-31 DIAGNOSIS — R410 Disorientation, unspecified: Secondary | ICD-10-CM | POA: Diagnosis not present

## 2023-02-01 DIAGNOSIS — I739 Peripheral vascular disease, unspecified: Secondary | ICD-10-CM | POA: Diagnosis not present

## 2023-02-01 DIAGNOSIS — I110 Hypertensive heart disease with heart failure: Secondary | ICD-10-CM | POA: Diagnosis not present

## 2023-02-01 DIAGNOSIS — F325 Major depressive disorder, single episode, in full remission: Secondary | ICD-10-CM | POA: Diagnosis not present

## 2023-02-01 DIAGNOSIS — R440 Auditory hallucinations: Secondary | ICD-10-CM | POA: Diagnosis not present

## 2023-02-01 DIAGNOSIS — F0393 Unspecified dementia, unspecified severity, with mood disturbance: Secondary | ICD-10-CM | POA: Diagnosis not present

## 2023-02-01 DIAGNOSIS — I509 Heart failure, unspecified: Secondary | ICD-10-CM | POA: Diagnosis not present

## 2023-02-02 ENCOUNTER — Inpatient Hospital Stay: Payer: PPO | Admitting: Family Medicine

## 2023-02-04 ENCOUNTER — Ambulatory Visit: Payer: PPO | Admitting: Family Medicine

## 2023-02-04 ENCOUNTER — Encounter: Payer: Self-pay | Admitting: Family Medicine

## 2023-02-04 VITALS — BP 145/70 | HR 80 | Temp 98.3°F | Resp 16 | Ht 68.0 in | Wt 118.3 lb

## 2023-02-04 DIAGNOSIS — W19XXXD Unspecified fall, subsequent encounter: Secondary | ICD-10-CM

## 2023-02-04 DIAGNOSIS — S22000A Wedge compression fracture of unspecified thoracic vertebra, initial encounter for closed fracture: Secondary | ICD-10-CM

## 2023-02-04 DIAGNOSIS — S22000D Wedge compression fracture of unspecified thoracic vertebra, subsequent encounter for fracture with routine healing: Secondary | ICD-10-CM | POA: Diagnosis not present

## 2023-02-04 DIAGNOSIS — S32010D Wedge compression fracture of first lumbar vertebra, subsequent encounter for fracture with routine healing: Secondary | ICD-10-CM

## 2023-02-04 NOTE — Progress Notes (Signed)
Established Patient Office Visit  Subjective   Patient ID: SHAKARIA SEALOCK, female    DOB: [birth year]  Age: 89 y.o. MRN: [id number]  Chief Complaint  Patient presents with   Hospitalization Follow-up    HPI  Discussed the use of AI scribe software for clinical note transcription with the patient, who gave verbal consent to proceed.  History of Present Illness   Roxane, a 89 year old woman with a history of vertebral fractures, presents with her daughter for follow-up after a recent fall. She reports experiencing a significant amount of back pain following the fall, which was later identified as a compression fracture in her spine. She has been wearing a brace for support, which she finds uncomfortable but helpful. The pain was initially managed with oxycodone, but due to severe abdominal discomfort, the medication was stopped and replaced with her usual regimen of tramadol and hydrocodone. The patient's daughter reports that the pain significantly diminished a few days after stopping the oxycodone.  In addition to her back pain, the patient has been experiencing gas pains, particularly in the morning. She has been taking over-the-counter gas relief pills for this issue. The patient's daughter reports that the patient's appetite was poor in the initial weeks following the fall but has since improved.  The patient has been using a walker at home for mobility. She had a fall at home due to slipping on her socks with rubber suction on a hardwood floor. The patient's daughter has filed a formal complaint regarding the initial misdiagnosis of the patient's fractures following her fall.         ROS    Objective:     BP (!) 145/70   Pulse 80   Temp 98.3 F (36.8 C)   Resp 16   Ht 5\' 8"  (1.727 m)   Wt 118 lb 4.8 oz (53.7 kg)   SpO2 98%   BMI 17.99 kg/m    Physical Exam Vitals reviewed.  Constitutional:      General: She is not in acute distress.    Appearance: Normal appearance.  She is well-developed. She is not diaphoretic.  HENT:     Head: Normocephalic and atraumatic.  Eyes:     General: No scleral icterus.    Conjunctiva/sclera: Conjunctivae normal.  Neck:     Thyroid: No thyromegaly.  Cardiovascular:     Rate and Rhythm: Normal rate and regular rhythm.     Heart sounds: Normal heart sounds. No murmur heard. Pulmonary:     Effort: Pulmonary effort is normal. No respiratory distress.     Breath sounds: Normal breath sounds. No wheezing, rhonchi or rales.  Musculoskeletal:     Cervical back: Neck supple.     Right lower leg: No edema.     Left lower leg: No edema.  Lymphadenopathy:     Cervical: No cervical adenopathy.  Skin:    General: Skin is warm and dry.     Findings: No rash.  Neurological:     Mental Status: She is alert and oriented to person, place, and time. Mental status is at baseline.  Psychiatric:        Mood and Affect: Mood normal.        Behavior: Behavior normal.      No results found for any visits on 02/04/23.    The ASCVD Risk score (Arnett DK, et al., 2019) failed to calculate for the following reasons:   The 2019 ASCVD risk score is only valid for ages 87  to 69    Assessment & Plan:   Problem List Items Addressed This Visit   None Visit Diagnoses     Compression fracture of body of thoracic vertebra (HCC)    -  Primary   Relevant Orders   Ambulatory referral to Home Health   Compression fracture of L1 vertebra with routine healing, subsequent encounter       Relevant Orders   Ambulatory referral to Home Health   Fall, subsequent encounter       Relevant Orders   Ambulatory referral to Home Health          Compression Fracture Recent fall resulting in compression fracture of the spine. Currently wearing a brace for a few hours daily, which provides some relief. Follow-up with neurosurgeon, Dr. Katrinka Blazing, scheduled for next week. -Continue wearing brace as tolerated. -Obtain new x-rays at next appointment with  Dr. Katrinka Blazing. -Request clear guidelines from Dr. Katrinka Blazing regarding activity limitations.  Pain Management Pain initially managed with oxycodone, but switched to tramadol and hydrocodone due to adverse effects. Currently, pain is well-controlled, but experiencing gas pain and constipation. -Reduce tramadol to once daily at night. -Continue half capful of MiraLAX daily. -Discontinue fiber supplement. -Notify prescribing provider, [place], of changes in medication use.  Fall Risk Recent fall resulting in compression fracture. Currently using a walker at home. -Initiate home health physical therapy for fall prevention after receiving guidelines from Dr. Katrinka Blazing. -Continue using walker at home.  Abdominal Discomfort Experiencing gas pain and constipation, likely secondary to pain medication use. -Adjust dose of MiraLAX as needed to achieve one soft bowel movement daily. -Consider reducing pain medication if discomfort persists.  General Health Maintenance / Followup Plans -Weight stable, continue monitoring. -Follow-up after appointment with Dr. Katrinka Blazing to discuss activity limitations and home health physical therapy.        Return for as scheduled.    [name], MD

## 2023-02-05 NOTE — Progress Notes (Signed)
Referring Physician:  Erasmo Downer, MD [address] [place],  [place] [address]  Primary Physician:  Erasmo Downer, MD  History of Present Illness: 02/08/2023 Ms. Becky Prince is here today with a chief complaint of multilevel compression fractures.  She was seen in the emergency department, and then again shortly 2 days later with continued back pain.  On follow-up examination she was found to have a new thoracic and lumbar compression fracture.  She is not having any neurologic deficits at this time.  No radiation into her legs.  Mostly pain with weightbearing in her back.  This was quite severe at the beginning but thankfully has improved with time.  She feels that she has had improvement in her back pain since that time without any treatment.  Mostly with conservative care and observation.  She does intermittently wear her brace.  She does feel better when she is able to stabilize herself on her walker.  She does have trouble standing up vertically as this does exacerbate her pain.  She has not had a kyphoplasty.  She does have a history of a previous compression fracture.  Has previously been treated for osteo porosis or long bone density but had improvement on Fosamax.  Has not had a updated DEXA scan recently.  Review of Systems:  A 10 point review of systems is negative, except for the pertinent positives and negatives detailed in the HPI.  Past Medical History: Past Medical History:  Diagnosis Date   Depression    Environmental allergies    Hypercholesteremia    Hypertension     Past Surgical History: Past Surgical History:  Procedure Laterality Date   ABDOMINAL HYSTERECTOMY     CATARACT EXTRACTION W/PHACO Right 06/12/2015   Procedure: CATARACT EXTRACTION PHACO AND INTRAOCULAR LENS PLACEMENT (IOC);  Surgeon: Lockie Mola, MD;  Location: [facility];  Service: Ophthalmology;  Laterality: Right;    Allergies: Allergies as of  02/08/2023 - Review Complete 02/08/2023  Allergen Reaction Noted   Naproxen Swelling 11/26/2014   Oxycodone-aspirin Nausea And Vomiting and Other (See Comments) 11/26/2014   Prednisone Diarrhea 06/04/2016    Medications:  Current Outpatient Medications:    atorvastatin (LIPITOR) 40 MG tablet, TAKE 1 TABLET BY MOUTH EVERY DAY, Disp: 90 tablet, Rfl: 1   citalopram (CELEXA) 20 MG tablet, TAKE 1 TABLET BY MOUTH EVERY DAY, Disp: 90 tablet, Rfl: 1   donepezil (ARICEPT) 10 MG tablet, TAKE 1 TABLET BY MOUTH EVERYDAY AT BEDTIME, Disp: 90 tablet, Rfl: 3   fluticasone (FLONASE) 50 MCG/ACT nasal spray, Place 2 sprays into both nostrils daily., Disp: 16 g, Rfl: 6   HYDROcodone-acetaminophen (NORCO/VICODIN) 5-325 MG tablet, Take 1 tablet by mouth every 6 (six) hours as needed for moderate pain (pain score 4-6)., Disp: , Rfl:    mirtazapine (REMERON) 15 MG tablet, Take 1 tablet (15 mg total) by mouth at bedtime., Disp: 90 tablet, Rfl: 1   spironolactone (ALDACTONE) 25 MG tablet, TAKE 1 TABLET BY MOUTH EVERY DAY, Disp: 90 tablet, Rfl: 0   traMADol (ULTRAM) 50 MG tablet, Take 50 mg by mouth every 6 (six) hours as needed., Disp: , Rfl:   Social History: Social History   Tobacco Use   Smoking status: Never   Smokeless tobacco: Never  Vaping Use   Vaping status: Never Used  Substance Use Topics   Alcohol use: No    Alcohol/week: 0.0 standard drinks of alcohol   Drug use: No    Family  Medical History: Family History  Problem Relation Age of Onset   Heart disease Mother        CHF   Hypertension Mother    Cancer Father        prostate   Cancer Sister        breast   Kidney disease Brother    Breast cancer Neg Hx     Physical Examination: Vitals:   02/08/23 1318  BP: (!) 140/80    General: Patient is in no apparent distress. Attention to examination is appropriate.  Neck:   Supple.  Full range of motion.  Respiratory: Patient is breathing without any difficulty.   NEUROLOGICAL:      Awake, alert, oriented to person, place, and time.  Speech is clear and fluent.   Cranial Nerves: Pupils equal round and reactive to light.  Facial tone is symmetric.  Facial sensation is symmetric. Shoulder shrug is symmetric. Tongue protrusion is midline.    Strength: No major motor deficits noted in her lower extremities.  No gross sensation loss in her lower extremities.     Currently in a wheelchair given the expected need for ambulation on her clinical appointment, usually ambulates with a walker.  Imaging: Narrative & Impression CLINICAL DATA:  Fall with back pain.   EXAM: CT THORACIC AND LUMBAR SPINE WITHOUT CONTRAST   TECHNIQUE: Multidetector CT imaging of the thoracic and lumbar spine was performed without intravenous contrast. Multiplanar CT image reconstructions were also generated.   RADIATION DOSE REDUCTION: This exam was performed according to the departmental dose-optimization program which includes automated exposure control, adjustment of the mA and/or kV according to patient size and/or use of iterative reconstruction technique.   COMPARISON:  Chest radiograph dated 01/18/2023, thoracic and lumbar spine radiographs dated 02/13/2022, CT thoracic and lumbar spine dated 12/17/2021.   FINDINGS: CT THORACIC SPINE FINDINGS   Alignment: No traumatic listhesis.   Vertebrae: There is an acute on chronic compression fracture of T7 with approximately 75% height loss centrally and 4 mm retropulsion into the central canal. The degree of height loss is increased since 12/17/2021 and retropulsion is unchanged. There is an acute compression fracture of T8 with approximately 50% height loss centrally and 3 mm retropulsion into the central canal.   Paraspinal and other soft tissues: Mild paraspinal hematoma at T7 and T8. Trace right pleural effusion with associated atelectasis.   Disc levels: Degenerative changes are seen in the spine.   CT LUMBAR SPINE  FINDINGS   Segmentation: 5 lumbar type vertebrae.   Alignment: There is unchanged levocurvature centered at L3. No traumatic listhesis.   Vertebrae: There is an acute on chronic compression fracture of L1 with approximately 50% height loss centrally and 4 mm retropulsion into the central canal. The degree of height loss is increased since 12/17/2021 and retropulsion is unchanged.   Paraspinal and other soft tissues: Negative.   Disc levels: Up to severe multilevel degenerative disc and joint disease.   IMPRESSION: 1. Acute on chronic compression fractures of T7 and L1 with increased height loss since 12/17/2021. The degree of retropulsion into the central canal is unchanged at these levels. 2. Acute compression fracture of T8 with approximately 50% height loss centrally and 3 mm retropulsion into the central canal. 3. Trace right pleural effusion with associated atelectasis.     Electronically Signed   By: Romona Curls M.D.   On: 01/23/2023 12:52   X-rays from today pending read, however no major gross changes noted on initial  evaluation  I have personally reviewed the images and agree with the above interpretation.  Medical Decision Making/Assessment and Plan: Ms. Lomax is a pleasant 89 y.o. female with a history of osteoporosis previously treated with Fosamax with no updated recent DEXA scans.  She was seen in the emergency department twice last month in close succession.  On her second trip to the emergency department in 2 days she was diagnosed with a worsening thoracolumbar compression fracture at T7 and L1 as well as T8.  She had central back pain at that time and fortunately this has improved significantly.  She has been intermittently wearing her brace for comfort.  She has not noticed any neurologic deficits.  Overall she feels that she is improving but does have some pain especially with standing vertically or completely upright.  She states that she needs to lean over  on her walker to have some support.  Her physical examination is without any major deficits.  Her imaging today has not yet been read by radiology but does not show any major changes in her compression fractures on my initial read.  Given her recent compression fractures and history of osteoporosis we think that she may benefit from a evaluation of her bone density and consideration of supplementation to help with her bone health.  Many people in her family live to be over 100 and she would like to avoid any more fractures in the future if possible.  She would like to start physical therapy which we agree with.  At this point she does not need any surgical intervention on her back.  We will hold off on evaluation/referral to kyphoplasty as she is having improved pain control.  Thank you for involving me in the care of this patient.    Lovenia Kim MD/MSCR Neurosurgery

## 2023-02-08 ENCOUNTER — Telehealth: Payer: Self-pay

## 2023-02-08 ENCOUNTER — Encounter: Payer: Self-pay | Admitting: Neurosurgery

## 2023-02-08 ENCOUNTER — Ambulatory Visit: Payer: PPO | Admitting: Neurosurgery

## 2023-02-08 ENCOUNTER — Ambulatory Visit
Admission: RE | Admit: 2023-02-08 | Discharge: 2023-02-08 | Disposition: A | Discharge status: Home / Self Care | Payer: PPO | Attending: Neurosurgery | Admitting: Neurosurgery

## 2023-02-08 ENCOUNTER — Ambulatory Visit
Admission: RE | Admit: 2023-02-08 | Discharge: 2023-02-08 | Disposition: A | Discharge status: Home / Self Care | Payer: PPO | Source: Ambulatory Visit | Attending: Neurosurgery | Admitting: Neurosurgery

## 2023-02-08 VITALS — BP 140/80 | Ht 69.0 in | Wt 118.0 lb

## 2023-02-08 DIAGNOSIS — S32010A Wedge compression fracture of first lumbar vertebra, initial encounter for closed fracture: Secondary | ICD-10-CM | POA: Diagnosis not present

## 2023-02-08 DIAGNOSIS — S22000A Wedge compression fracture of unspecified thoracic vertebra, initial encounter for closed fracture: Secondary | ICD-10-CM

## 2023-02-08 DIAGNOSIS — M438X5 Other specified deforming dorsopathies, thoracolumbar region: Secondary | ICD-10-CM | POA: Diagnosis not present

## 2023-02-08 DIAGNOSIS — S22060A Wedge compression fracture of T7-T8 vertebra, initial encounter for closed fracture: Secondary | ICD-10-CM

## 2023-02-08 NOTE — Telephone Encounter (Signed)
Copied from CRM [id number]. Topic: General - Other >> Feb 08, 2023  2:17 PM Franchot Heidelberg wrote: Reason for CRM: Pt's daughter called reporting that they do NOT want [facility]. She says they requested [facility] home health services instead.   They have just left neurology and the provider has confirmed that she can begin her PT orders.

## 2023-02-08 NOTE — Telephone Encounter (Signed)
I put [facility] preference in the comments of the referral, but told her I wasn't sure if it would be in network. Is there somewhre else it can be sent?

## 2023-02-09 NOTE — Telephone Encounter (Signed)
Please see note from earlier today re: ?osteoporosis.  Please order repeat DEXA scan for her compression fractures.  Also, Maye Hides, can her [facility] be sent to [facility] instead?

## 2023-02-09 NOTE — Telephone Encounter (Signed)
Patient daughter reports that [facility] is in Network that she called to verified.  She wants Dr. Leonard Schwartz to know what Dr. Ernestine Mcmurray with Neurosurgery recommended.  Patient to start PT at any time now Recommending bone density to see if patient has Osteoporosis in her spine.

## 2023-02-09 NOTE — Telephone Encounter (Signed)
Copied from CRM [id number]. Topic: General - Other >> Feb 08, 2023  2:17 PM Franchot Heidelberg wrote: Reason for CRM: Pt's daughter called reporting that they do NOT want [facility] home [facility]. She says they requested [facility] home health services instead.   They have just left neurology and the provider has confirmed that she can begin her PT orders. >> Feb 08, 2023  2:19 PM Franchot Heidelberg wrote: Pt's daughter says to please call her if there is any issue with this request  Best contact: [phone]

## 2023-02-10 ENCOUNTER — Telehealth: Payer: Self-pay | Admitting: Family Medicine

## 2023-02-10 NOTE — Telephone Encounter (Signed)
Dexa ordered

## 2023-02-10 NOTE — Telephone Encounter (Signed)
Deanna Artis from [facility] called stated they are out of network with patients insurance

## 2023-02-11 NOTE — Telephone Encounter (Signed)
Just to clarify, This says [facility] and daughter had requested [facility]

## 2023-02-12 DIAGNOSIS — Z87898 Personal history of other specified conditions: Secondary | ICD-10-CM | POA: Diagnosis not present

## 2023-02-12 DIAGNOSIS — Z09 Encounter for follow-up examination after completed treatment for conditions other than malignant neoplasm: Secondary | ICD-10-CM | POA: Diagnosis not present

## 2023-02-17 ENCOUNTER — Other Ambulatory Visit: Payer: Self-pay | Admitting: Family Medicine

## 2023-02-22 ENCOUNTER — Other Ambulatory Visit: Payer: Self-pay | Admitting: Family Medicine

## 2023-02-22 NOTE — Telephone Encounter (Unsigned)
Copied from CRM [id number]. Topic: General - Other >> Feb 22, 2023  8:27 AM Everette C wrote: Reason for CRM: Medication Refill - Medication: donepezil (ARICEPT) 10 MG tablet [[id number]]  Has the patient contacted their pharmacy? Yes.   (Agent: If no, request that the patient contact the pharmacy for the refill. If patient does not wish to contact the pharmacy document the reason why and proceed with request.) (Agent: If yes, when and what did the pharmacy advise?)  Preferred Pharmacy (with phone number or street name): [id number] [place], [place] - [address] [place] [address] Phone: [phone] Fax: [phone] Hours: Not open 24 hours   Has the patient been seen for an appointment in the last year OR does the patient have an upcoming appointment? Yes.    Agent: Please be advised that RX refills may take up to 3 business days. We ask that you follow-up with your pharmacy.

## 2023-02-23 NOTE — Telephone Encounter (Signed)
[facility] Requested Prescriptions  Refused Prescriptions Disp Refills   donepezil (ARICEPT) 10 MG tablet 90 tablet 3     Neurology:  Alzheimer's Agents Passed - 02/22/2023 10:41 AM      Passed - Valid encounter within last 6 months    Recent Outpatient Visits           2 weeks ago Compression fracture of body of thoracic vertebra [facility])   [place] [facility] [place], Marzella Schlein, MD   3 months ago Encounter for annual wellness visit (AWV) in Medicare patient   [place] [facility] [employer], Marzella Schlein, MD   5 months ago Mixed dementia [facility])   [place] [facility]ery[facility] Flock, Marzella Schlein, MD   9 months ago History of UTI   [facility] [place], Marzella Schlein, MD   10 months ago Altered mental status, unspecified altered mental status type   [facility] Caro Laroche, DO       Future Appointments             In 2 months Bacigalupo, Marzella Schlein, MD [facility]

## 2023-02-26 ENCOUNTER — Telehealth: Payer: Self-pay

## 2023-02-26 DIAGNOSIS — J9811 Atelectasis: Secondary | ICD-10-CM | POA: Diagnosis not present

## 2023-02-26 DIAGNOSIS — S32018D Other fracture of first lumbar vertebra, subsequent encounter for fracture with routine healing: Secondary | ICD-10-CM | POA: Diagnosis not present

## 2023-02-26 DIAGNOSIS — Z9181 History of falling: Secondary | ICD-10-CM | POA: Diagnosis not present

## 2023-02-26 DIAGNOSIS — S22060D Wedge compression fracture of T7-T8 vertebra, subsequent encounter for fracture with routine healing: Secondary | ICD-10-CM | POA: Diagnosis not present

## 2023-02-26 DIAGNOSIS — Z79899 Other long term (current) drug therapy: Secondary | ICD-10-CM | POA: Diagnosis not present

## 2023-02-26 DIAGNOSIS — F32A Depression, unspecified: Secondary | ICD-10-CM | POA: Diagnosis not present

## 2023-02-26 DIAGNOSIS — E7800 Pure hypercholesterolemia, unspecified: Secondary | ICD-10-CM | POA: Diagnosis not present

## 2023-02-26 DIAGNOSIS — I10 Essential (primary) hypertension: Secondary | ICD-10-CM | POA: Diagnosis not present

## 2023-02-26 NOTE — Telephone Encounter (Signed)
Transition Care Management Follow-up Telephone Call Date of discharge and from where: 01/23/2023 [facility] How have you been since you were released from the hospital? Per patient's daughter she is feeling better. Any questions or concerns? No  Items Reviewed: Did the pt receive and understand the discharge instructions provided? Yes  Medications obtained and verified? Yes  Other? No  Any new allergies since your discharge? No  Dietary orders reviewed? Yes Do you have support at home? Yes   Follow up appointments reviewed:  PCP Hospital f/u appt confirmed? Yes  Scheduled to see Marzella Schlein. Beryle Flock, MD on 02/04/2023 @ [facility] Specialist Hospital f/u appt confirmed? Yes  Scheduled to see Lovenia Kim, MD on 02/08/2023 @ [facility] Are transportation arrangements needed? No  If their condition worsens, is the pt aware to call PCP or go to the Emergency Dept.? Yes Was the patient provided with contact information for the PCP's office or ED? Yes Was to pt encouraged to call back with questions or concerns? Yes   [facility]  [facility] Guide Direct Dial: [phone]  Website: [email or web address]

## 2023-03-11 DIAGNOSIS — Z9181 History of falling: Secondary | ICD-10-CM

## 2023-03-11 DIAGNOSIS — S22060D Wedge compression fracture of T7-T8 vertebra, subsequent encounter for fracture with routine healing: Secondary | ICD-10-CM

## 2023-03-11 DIAGNOSIS — S32018D Other fracture of first lumbar vertebra, subsequent encounter for fracture with routine healing: Secondary | ICD-10-CM

## 2023-03-11 DIAGNOSIS — E7800 Pure hypercholesterolemia, unspecified: Secondary | ICD-10-CM

## 2023-03-11 DIAGNOSIS — J9811 Atelectasis: Secondary | ICD-10-CM

## 2023-03-11 DIAGNOSIS — I10 Essential (primary) hypertension: Secondary | ICD-10-CM

## 2023-03-11 DIAGNOSIS — Z79899 Other long term (current) drug therapy: Secondary | ICD-10-CM

## 2023-03-11 DIAGNOSIS — F32A Depression, unspecified: Secondary | ICD-10-CM

## 2023-03-15 DIAGNOSIS — N1832 Chronic kidney disease, stage 3b: Secondary | ICD-10-CM | POA: Diagnosis not present

## 2023-03-15 DIAGNOSIS — M5416 Radiculopathy, lumbar region: Secondary | ICD-10-CM | POA: Diagnosis not present

## 2023-03-15 DIAGNOSIS — I129 Hypertensive chronic kidney disease with stage 1 through stage 4 chronic kidney disease, or unspecified chronic kidney disease: Secondary | ICD-10-CM | POA: Diagnosis not present

## 2023-03-15 DIAGNOSIS — M48062 Spinal stenosis, lumbar region with neurogenic claudication: Secondary | ICD-10-CM | POA: Diagnosis not present

## 2023-03-15 DIAGNOSIS — I10 Essential (primary) hypertension: Secondary | ICD-10-CM | POA: Diagnosis not present

## 2023-03-16 DIAGNOSIS — M5416 Radiculopathy, lumbar region: Secondary | ICD-10-CM | POA: Diagnosis not present

## 2023-03-16 DIAGNOSIS — M48062 Spinal stenosis, lumbar region with neurogenic claudication: Secondary | ICD-10-CM | POA: Diagnosis not present

## 2023-04-09 ENCOUNTER — Other Ambulatory Visit: Payer: Self-pay | Admitting: Family Medicine

## 2023-04-09 NOTE — Telephone Encounter (Signed)
Requested medication (s) are due for refill today: yes  Requested medication (s) are on the active medication list: yes  Last refill:  01/11/23 #90  Future visit scheduled: yes  Notes to clinic:  Cr level outside normal range   Requested Prescriptions  Pending Prescriptions Disp Refills   spironolactone (ALDACTONE) 25 MG tablet [Pharmacy Med Name: SPIRONOLACTONE 25 MG TABLET] 90 tablet 0    Sig: TAKE 1 TABLET BY MOUTH EVERY DAY     Cardiovascular: Diuretics - Aldosterone Antagonist Failed - 04/09/2023  8:44 AM      Failed - Cr in normal range and within 180 days    Creat  Date Value Ref Range Status  03/11/2017 1.42 (H) 0.60 - 0.88 mg/dL Final    Comment:    For patients >27 years of age, the reference limit for Creatinine is approximately 13% higher for people identified as African-American. .    Creatinine, Ser  Date Value Ref Range Status  01/23/2023 1.18 (H) 0.44 - 1.00 mg/dL Final         Failed - Last BP in normal range    BP Readings from Last 1 Encounters:  02/08/23 (!) 140/80         Passed - K in normal range and within 180 days    Potassium  Date Value Ref Range Status  01/23/2023 4.3 3.5 - 5.1 mmol/L Final         Passed - Na in normal range and within 180 days    Sodium  Date Value Ref Range Status  01/23/2023 135 135 - 145 mmol/L Final  04/13/2022 140 134 - 144 mmol/L Final         Passed - eGFR is 30 or above and within 180 days    GFR, Est African American  Date Value Ref Range Status  03/11/2017 38 (L) > OR = 60 mL/min/1.93m2 Final   GFR calc Af Amer  Date Value Ref Range Status  03/14/2020 36 (L) >59 mL/min/1.73 Final    Comment:    **In accordance with recommendations from the NKF-ASN Task force,**   [facility] is in the process of updating its eGFR calculation to the   2021 CKD-EPI creatinine equation that estimates kidney function   without a race variable.    GFR, Est Non African American  Date Value Ref Range Status  03/11/2017  33 (L) > OR = 60 mL/min/1.40m2 Final   GFR, Estimated  Date Value Ref Range Status  01/23/2023 43 (L) >60 mL/min Final    Comment:    (NOTE) Calculated using the CKD-EPI Creatinine Equation (2021)    eGFR  Date Value Ref Range Status  04/13/2022 34 (L) >59 mL/min/1.73 Final         Passed - Valid encounter within last 6 months    Recent Outpatient Visits           2 months ago Compression fracture of body of thoracic vertebra [facility])   [place] [facility] [place], Marzella Schlein, MD   4 months ago Encounter for annual wellness visit (AWV) in Medicare patient   [place] [facility] [place], Marzella Schlein, MD   7 months ago Mixed dementia [facility])   [place] [facility], Marzella Schlein, MD   11 months ago History of UTI   [facility] Erasmo Downer, MD   12 months ago Altered mental status, unspecified altered mental status type   [facility] Caro Laroche, DO       Future Appointments             In 1 month Bacigalupo, Marzella Schlein, MD [facility]

## 2023-04-20 DIAGNOSIS — B351 Tinea unguium: Secondary | ICD-10-CM | POA: Diagnosis not present

## 2023-04-20 DIAGNOSIS — L97521 Non-pressure chronic ulcer of other part of left foot limited to breakdown of skin: Secondary | ICD-10-CM | POA: Diagnosis not present

## 2023-04-20 DIAGNOSIS — L600 Ingrowing nail: Secondary | ICD-10-CM | POA: Diagnosis not present

## 2023-04-20 DIAGNOSIS — M2041 Other hammer toe(s) (acquired), right foot: Secondary | ICD-10-CM | POA: Diagnosis not present

## 2023-04-20 DIAGNOSIS — M2011 Hallux valgus (acquired), right foot: Secondary | ICD-10-CM | POA: Diagnosis not present

## 2023-04-20 DIAGNOSIS — M79674 Pain in right toe(s): Secondary | ICD-10-CM | POA: Diagnosis not present

## 2023-04-20 DIAGNOSIS — M2042 Other hammer toe(s) (acquired), left foot: Secondary | ICD-10-CM | POA: Diagnosis not present

## 2023-04-20 DIAGNOSIS — M79675 Pain in left toe(s): Secondary | ICD-10-CM | POA: Diagnosis not present

## 2023-04-20 DIAGNOSIS — M2012 Hallux valgus (acquired), left foot: Secondary | ICD-10-CM | POA: Diagnosis not present

## 2023-04-29 ENCOUNTER — Other Ambulatory Visit: Payer: PPO

## 2023-05-17 ENCOUNTER — Ambulatory Visit: Payer: PPO | Admitting: Family Medicine

## 2023-05-19 ENCOUNTER — Ambulatory Visit
Admission: RE | Admit: 2023-05-19 | Discharge: 2023-05-19 | Disposition: A | Discharge status: Home / Self Care | Payer: PPO | Source: Ambulatory Visit | Attending: Family Medicine | Admitting: Family Medicine

## 2023-05-19 DIAGNOSIS — Z780 Asymptomatic menopausal state: Secondary | ICD-10-CM | POA: Diagnosis not present

## 2023-05-19 DIAGNOSIS — S22000A Wedge compression fracture of unspecified thoracic vertebra, initial encounter for closed fracture: Secondary | ICD-10-CM | POA: Insufficient documentation

## 2023-05-19 DIAGNOSIS — M810 Age-related osteoporosis without current pathological fracture: Secondary | ICD-10-CM | POA: Diagnosis not present

## 2023-05-20 ENCOUNTER — Encounter: Payer: Self-pay | Admitting: Family Medicine

## 2023-05-21 ENCOUNTER — Encounter: Payer: Self-pay | Admitting: Family Medicine

## 2023-05-21 ENCOUNTER — Ambulatory Visit (INDEPENDENT_AMBULATORY_CARE_PROVIDER_SITE_OTHER): Payer: PPO | Admitting: Family Medicine

## 2023-05-21 VITALS — BP 140/74 | HR 60 | Ht 69.0 in | Wt 115.0 lb

## 2023-05-21 DIAGNOSIS — E042 Nontoxic multinodular goiter: Secondary | ICD-10-CM

## 2023-05-21 DIAGNOSIS — L989 Disorder of the skin and subcutaneous tissue, unspecified: Secondary | ICD-10-CM

## 2023-05-21 DIAGNOSIS — N1832 Chronic kidney disease, stage 3b: Secondary | ICD-10-CM

## 2023-05-21 DIAGNOSIS — Z23 Encounter for immunization: Secondary | ICD-10-CM | POA: Diagnosis not present

## 2023-05-21 DIAGNOSIS — F0150 Vascular dementia without behavioral disturbance: Secondary | ICD-10-CM | POA: Diagnosis not present

## 2023-05-21 DIAGNOSIS — I10 Essential (primary) hypertension: Secondary | ICD-10-CM

## 2023-05-21 DIAGNOSIS — E0590 Thyrotoxicosis, unspecified without thyrotoxic crisis or storm: Secondary | ICD-10-CM

## 2023-05-21 DIAGNOSIS — G309 Alzheimer's disease, unspecified: Secondary | ICD-10-CM | POA: Diagnosis not present

## 2023-05-21 DIAGNOSIS — E7800 Pure hypercholesterolemia, unspecified: Secondary | ICD-10-CM

## 2023-05-21 DIAGNOSIS — F0280 Dementia in other diseases classified elsewhere without behavioral disturbance: Secondary | ICD-10-CM

## 2023-05-21 DIAGNOSIS — R5381 Other malaise: Secondary | ICD-10-CM | POA: Diagnosis not present

## 2023-05-21 MED ORDER — ALENDRONATE SODIUM 70 MG PO TABS: 70.0000 mg | ORAL_TABLET | ORAL | 3 refills | Status: DC

## 2023-05-21 NOTE — Progress Notes (Signed)
Established patient visit   Patient: Becky Prince   DOB: 1925-09-29   89 y.o. Female  MRN: [id number] Visit Date: 05/21/2023  Today's healthcare provider: [name], MD   Chief Complaint  Patient presents with   Follow-up   Subjective    HPI   Discussed the use of AI scribe software for clinical note transcription with the patient, who gave verbal consent to proceed.  History of Present Illness   The patient, an elderly woman with a history of osteoporosis and frequent falls, presents for a routine follow-up. She reports feeling generally unwell and run down for the past few days, with no specific symptoms such as cough, muscle aches, or urinary symptoms. She also mentions having excess phlegm in the mornings, which does not seem to be associated with any other respiratory symptoms.  The patient's daughter reports that the patient fell on New Year's Day, resulting in a bruise on her back. This is concerning given the patient's history of osteoporosis and previous fractures. The patient also has a new skin growth on her left shin, which appeared suddenly and has been growing.  The patient's osteoporosis has been worsening, as evidenced by a recent bone density scan. She has a history of taking Fosamax in her fifties, which was effective at the time. The patient and her daughter are open to restarting this medication to reduce the risk of fractures.         Medications: Outpatient Medications Prior to Visit  Medication Sig   atorvastatin (LIPITOR) 40 MG tablet TAKE 1 TABLET BY MOUTH EVERY DAY   citalopram (CELEXA) 20 MG tablet TAKE 1 TABLET BY MOUTH EVERY DAY   donepezil (ARICEPT) 10 MG tablet TAKE 1 TABLET BY MOUTH EVERYDAY AT BEDTIME   fluticasone (FLONASE) 50 MCG/ACT nasal spray Place 2 sprays into both nostrils daily.   HYDROcodone-acetaminophen (NORCO/VICODIN) 5-325 MG tablet Take 1 tablet by mouth every 6 (six) hours as needed for moderate pain (pain score  4-6).   mirtazapine (REMERON) 15 MG tablet Take 1 tablet (15 mg total) by mouth at bedtime.   spironolactone (ALDACTONE) 25 MG tablet TAKE 1 TABLET BY MOUTH EVERY DAY   traMADol (ULTRAM) 50 MG tablet Take 50 mg by mouth every 6 (six) hours as needed.   No facility-administered medications prior to visit.    Review of Systems     Objective    BP (!) 140/74 (BP Location: Left Arm, Patient Position: Sitting, Cuff Size: Normal)   Pulse 60   Ht 5\' 9"  (1.753 m)   Wt 115 lb (52.2 kg)   BMI 16.98 kg/m    Physical Exam Vitals reviewed.  Constitutional:      General: She is not in acute distress.    Appearance: Normal appearance. She is well-developed. She is not diaphoretic.  HENT:     Head: Normocephalic and atraumatic.  Eyes:     General: No scleral icterus.    Conjunctiva/sclera: Conjunctivae normal.  Neck:     Thyroid: No thyromegaly.  Cardiovascular:     Rate and Rhythm: Normal rate and regular rhythm.     Heart sounds: Murmur heard.  Pulmonary:     Effort: Pulmonary effort is normal. No respiratory distress.     Breath sounds: Normal breath sounds. No wheezing, rhonchi or rales.  Musculoskeletal:     Cervical back: Neck supple.     Right lower leg: No edema.     Left lower leg: No edema.  Lymphadenopathy:     Cervical: No cervical adenopathy.  Skin:    General: Skin is warm and dry.     Findings: No rash.  Neurological:     Mental Status: She is alert and oriented to person, place, and time. Mental status is at baseline.  Psychiatric:        Mood and Affect: Mood normal.        Behavior: Behavior normal.        No results found for any visits on 05/21/23.  Assessment & Plan     Problem List Items Addressed This Visit       Cardiovascular and Mediastinum   BP (high blood pressure) - Primary   Mixed dementia (HCC)     Endocrine   Subclinical hyperthyroidism   Multinodular goiter     Genitourinary   Stage 3b chronic kidney disease (HCC)     Other    Hypercholesteremia   Other Visit Diagnoses       Skin lesion       Relevant Orders   Ambulatory referral to Dermatology     Malaise              Cutaneous Horn New growth on the left shin, identified as a precancerous cutaneous horn. Discussed potential for growth and importance of removal to prevent progression to skin cancer. Patient prefers to wait until late March for the appointment. - Refer to [facility] in [place] - Take a photo of the lesion and upload to chart - Schedule dermatology appointment for late March  Osteoporosis Worsening osteoporosis with fragility fractures. Discussed benefits of Fosamax to reduce fracture risk, including administration guidelines to prevent esophageal irritation. Patient previously tolerated Fosamax and is willing to restart it. - Prescribe Fosamax 70 mg once weekly - [facility] [place]  Hypertension Blood pressure improved to 140/74 after resting. Current management with spironolactone is effective. - Continue spironolactone - Monitor blood pressure  Malaise Intermittent malaise without infectious symptoms. Recent fall on January 1st with bruising but no hospital visit. Discussed starting a multivitamin to address potential deficiencies. Patient prefers to avoid blood tests at this time. - Recommend starting a multivitamin - Monitor symptoms and consider labs if malaise persists  Excess Phlegm Complaints of excess phlegm in the morning, likely due to overnight drainage. Patient has Flonase but needs to use it more consistently. - Recommend using Flonase nasal spray nightly  General Health Maintenance Due for flu vaccination. Discussed importance of flu shot given the late flu season. Patient will wait for updated COVID vaccine in the spring. - Administer flu shot today  Follow-up - Schedule physical in six months - Return sooner if needed.       Return in about 6 months (around 11/18/2023) for CPE.        [name], MD  [facility] [phone] (phone) [phone] (fax)  [facility]

## 2023-06-10 ENCOUNTER — Other Ambulatory Visit: Payer: Self-pay | Admitting: Family Medicine

## 2023-06-10 NOTE — Telephone Encounter (Signed)
Requested Prescriptions  Pending Prescriptions Disp Refills   mirtazapine (REMERON) 15 MG tablet [Pharmacy Med Name: MIRTAZAPINE 15 MG TABLET] 90 tablet 1    Sig: TAKE 1 TABLET BY MOUTH EVERYDAY AT BEDTIME     Psychiatry: Antidepressants - mirtazapine Passed - 06/10/2023  1:53 PM      Passed - Completed PHQ-2 or PHQ-9 in the last 360 days      Passed - Valid encounter within last 6 months    Recent Outpatient Visits           2 weeks ago Primary hypertension   [facility], Marzella Schlein, MD   4 months ago Compression fracture of body of thoracic vertebra [facility])   [place] [facility], Marzella Schlein, MD   6 months ago Encounter for annual wellness visit (AWV) in Medicare patient   [place] [facility] [place], Marzella Schlein, MD   9 months ago Mixed dementia [facility])   [facility], Marzella Schlein, MD   1 year ago History of UTI   [facility] [place], Marzella Schlein, MD       Future Appointments             In 5 months Bacigalupo, Marzella Schlein, MD [facility]             atorvastatin (LIPITOR) 40 MG tablet [Pharmacy Med Name: ATORVASTATIN 40 MG TABLET] 90 tablet 1    Sig: TAKE 1 TABLET BY MOUTH EVERY DAY     Cardiovascular:  Antilipid - Statins Failed - 06/10/2023  1:53 PM      Failed - Lipid Panel in normal range within the last 12 months    Cholesterol, Total  Date Value Ref Range Status  11/13/2022 142 100 - 199 mg/dL Final   LDL Chol Calc (NIH)  Date Value Ref Range Status  11/13/2022 53 0 - 99 mg/dL Final   HDL  Date Value Ref Range Status  11/13/2022 67 >39 mg/dL Final   Triglycerides  Date Value Ref Range Status  11/13/2022 125 0 - 149 mg/dL Final         Passed - Patient is not pregnant      Passed - Valid encounter within last 12 months    Recent Outpatient Visits           2 weeks ago Primary  hypertension   [facility] [place], Marzella Schlein, MD   4 months ago Compression fracture of body of thoracic vertebra [facility])   [facility], Marzella Schlein, MD   6 months ago Encounter for annual wellness visit (AWV) in Medicare patient   [facility] [place], Marzella Schlein, MD   9 months ago Mixed dementia [facility])    [facility] [place], Marzella Schlein, MD   1 year ago History of UTI   [facility] [place], Marzella Schlein, MD       Future Appointments             In 5 months Bacigalupo, Marzella Schlein, MD [facility], [facility]

## 2023-06-15 DIAGNOSIS — M48062 Spinal stenosis, lumbar region with neurogenic claudication: Secondary | ICD-10-CM | POA: Diagnosis not present

## 2023-06-15 DIAGNOSIS — M5416 Radiculopathy, lumbar region: Secondary | ICD-10-CM | POA: Diagnosis not present

## 2023-06-15 DIAGNOSIS — Z79899 Other long term (current) drug therapy: Secondary | ICD-10-CM | POA: Diagnosis not present

## 2023-06-25 DIAGNOSIS — M48062 Spinal stenosis, lumbar region with neurogenic claudication: Secondary | ICD-10-CM | POA: Diagnosis not present

## 2023-06-25 DIAGNOSIS — M5416 Radiculopathy, lumbar region: Secondary | ICD-10-CM | POA: Diagnosis not present

## 2023-08-30 ENCOUNTER — Other Ambulatory Visit: Payer: Self-pay | Admitting: Family Medicine

## 2023-09-13 ENCOUNTER — Ambulatory Visit: Payer: Self-pay

## 2023-09-13 ENCOUNTER — Other Ambulatory Visit: Payer: Self-pay | Admitting: Family Medicine

## 2023-09-13 MED ORDER — SPIRONOLACTONE 25 MG PO TABS: 25.0000 mg | ORAL_TABLET | Freq: Every day | ORAL | 0 refills | Status: DC

## 2023-09-13 NOTE — Telephone Encounter (Signed)
 Ok to send Rx for spironolactone  per protocol. Will see her tomorrow and evaluate the concerns

## 2023-09-13 NOTE — Telephone Encounter (Signed)
 Copied from CRM [id number]. Topic: Clinical - Medication Refill >> Sep 13, 2023  9:51 AM Juluis Ok wrote: Patient's daughter, Suzzanna Estimable, calling to check the status of med refill. She states that pharmacy informed her that refill request was denied and that patient has no more refills remaining. Request a callback at [phone]

## 2023-09-13 NOTE — Telephone Encounter (Signed)
  Chief Complaint: fatigue and weakness Symptoms: fatigue Frequency: X 3 days Pertinent Negatives: Patient denies fever, tingling, numbness, all other symptoms not mentioned in additional notes Disposition: [] ED /[] Urgent Care (no appt availability in office) / [x] Appointment(In office/virtual)/ []  [place] [facility]/ [] Home Care/ [] Refused Recommended Disposition /[] [place] [facility]/ []  Follow-up with PCP Additional Notes:  Daughter (DPR) Becky Prince calling to scheduled appointment and check on medication issue. For the past few days Becky Prince has been complaining that she if feeling weak and tired. She will get exhausted very easily which is not normal for her. Otherwise she is 'feeling really well". Eating/drinking well. Receives "pain injections for her back' next due June 6, her pain is managed well on this plan. Daughter Becky Prince states they have been having an issue with [facility] getting her prescription  spironolactone , please review. Acute visit advised, scheduled next available acute visit with pcp on 09/14/23. Educated on care advice as documented in protocol, patient verbalized understanding.    Copied from CRM [id number]. Topic: Clinical - Red Word Triage >> Sep 13, 2023  9:46 AM Juluis Ok wrote: [facility] that prompted transfer to Nurse Triage: Extreme fatigue, weakness Reason for Disposition  [1] MODERATE weakness (i.e., interferes with work, school, normal activities) AND [2] persists > 3 days  Protocols used: Weakness (Generalized) and Fatigue-A-AH

## 2023-09-13 NOTE — Telephone Encounter (Signed)
 Requested Prescriptions  Pending Prescriptions Disp Refills   spironolactone  (ALDACTONE ) 25 MG tablet 90 tablet 0    Sig: Take 1 tablet (25 mg total) by mouth daily.     Cardiovascular: Diuretics - Aldosterone Antagonist Failed - 09/13/2023 10:53 AM      Failed - Cr in normal range and within 180 days    Creat  Date Value Ref Range Status  03/11/2017 1.42 (H) 0.60 - 0.88 mg/dL Final    Comment:    For patients >88 years of age, the reference limit for Creatinine is approximately 13% higher for people identified as African-American. .    Creatinine, Ser  Date Value Ref Range Status  01/23/2023 1.18 (H) 0.44 - 1.00 mg/dL Final         Failed - K in normal range and within 180 days    Potassium  Date Value Ref Range Status  01/23/2023 4.3 3.5 - 5.1 mmol/L Final         Failed - Na in normal range and within 180 days    Sodium  Date Value Ref Range Status  01/23/2023 135 135 - 145 mmol/L Final  04/13/2022 140 134 - 144 mmol/L Final         Failed - eGFR is 30 or above and within 180 days    GFR, Est African American  Date Value Ref Range Status  03/11/2017 38 (L) > OR = 60 mL/min/1.61m2 Final   GFR calc Af Amer  Date Value Ref Range Status  03/14/2020 36 (L) >59 mL/min/1.73 Final    Comment:    **In accordance with recommendations from the NKF-ASN Task force,**   [facility] is in the process of updating its eGFR calculation to the   2021 CKD-EPI creatinine equation that estimates kidney function   without a race variable.    GFR, Est Non African American  Date Value Ref Range Status  03/11/2017 33 (L) > OR = 60 mL/min/1.23m2 Final   GFR, Estimated  Date Value Ref Range Status  01/23/2023 43 (L) >60 mL/min Final    Comment:    (NOTE) Calculated using the CKD-EPI Creatinine Equation (2021)    eGFR  Date Value Ref Range Status  04/13/2022 34 (L) >59 mL/min/1.73 Final         Failed - Last BP in normal range    BP Readings from Last 1 Encounters:  05/21/23  (!) 140/74         Failed - Valid encounter within last 6 months    Recent Outpatient Visits   None     Future Appointments             In 2 months Bacigalupo, Stan Eans, MD [facility]

## 2023-09-14 ENCOUNTER — Telehealth: Admitting: Family Medicine

## 2023-09-14 ENCOUNTER — Encounter: Payer: Self-pay | Admitting: Family Medicine

## 2023-09-14 DIAGNOSIS — R410 Disorientation, unspecified: Secondary | ICD-10-CM | POA: Diagnosis not present

## 2023-09-14 DIAGNOSIS — R5381 Other malaise: Secondary | ICD-10-CM

## 2023-09-14 DIAGNOSIS — R531 Weakness: Secondary | ICD-10-CM

## 2023-09-14 DIAGNOSIS — W19XXXA Unspecified fall, initial encounter: Secondary | ICD-10-CM | POA: Diagnosis not present

## 2023-09-14 DIAGNOSIS — R5383 Other fatigue: Secondary | ICD-10-CM

## 2023-09-14 NOTE — Progress Notes (Signed)
 MyChart Video Visit    Virtual Visit via Video Note   This format is felt to be most appropriate for this patient at this time. Physical exam was limited by quality of the video and audio technology used for the visit.    Patient location: home Provider location: [facility] Persons involved in the visit: patient, provider, patient's daughter  I discussed the limitations of evaluation and management by telemedicine and the availability of in person appointments. The patient expressed understanding and agreed to proceed.  Patient: Becky Prince   DOB: 02/18/1926   89 y.o. Female  MRN: [id number] Visit Date: 09/14/2023  Today's healthcare provider: Aden Agreste, MD   Chief Complaint  Patient presents with   Acute Visit    Weakness and fatigue   Subjective    HPI HPI     Acute Visit    Additional comments: Weakness and fatigue      Last edited by Mazie Speed, MD on 09/14/2023 10:56 AM.       Discussed the use of AI scribe software for clinical note transcription with the patient, who gave verbal consent to proceed.  History of Present Illness   Becky Prince "Becky Prince" is a 89 year old female who presents with fatigue, weakness, and a recent fall.  She experiences increased fatigue and weakness over the past week, with a lack of energy and reluctance to move. She manages basic activities like going to the bathroom and kitchen. No pain, fever, cough, congestion, or urinary symptoms are present. She feels slightly confused and not herself.  She fell yesterday while going to a beauty shop appointment. Her daughter, Becky Prince, was assisting her with a walker when she lost her balance, causing both to fall. No injuries occurred, and Becky Prince broke her fall. She uses a walker instead of a cane but appears very weak.  Becky Prince notes an abrupt onset of weakness and fatigue, with increased confusion. Becky Prince has been with her for the past two to three  weeks and reports adequate eating and sleeping. There is a history of short-term memory issues, but the current confusion is more pronounced. A previous UTI presented with a severe personality change, but no such change is noted now.         Review of Systems      Objective    There were no vitals taken for this visit.      Physical Exam Constitutional:      General: She is not in acute distress.    Appearance: Normal appearance.  HENT:     Head: Normocephalic.  Pulmonary:     Effort: Pulmonary effort is normal. No respiratory distress.  Neurological:     Mental Status: She is alert and oriented to person, place, and time. Mental status is at baseline.        Assessment & Plan     Problem List Items Addressed This Visit   None Visit Diagnoses       Other fatigue    -  Primary   Relevant Orders   CBC with Differential/Platelet   Comprehensive metabolic panel with GFR   Vitamin B12   VITAMIN D 25 Hydroxy (Vit-D Deficiency, Fractures)   TSH   Urinalysis, Routine w reflex microscopic   Urine Culture     Malaise       Relevant Orders   CBC with Differential/Platelet   Comprehensive metabolic panel with GFR   Vitamin B12   VITAMIN  D 25 Hydroxy (Vit-D Deficiency, Fractures)   TSH   Urinalysis, Routine w reflex microscopic   Urine Culture     Weakness generalized       Relevant Orders   CBC with Differential/Platelet   Comprehensive metabolic panel with GFR   Vitamin B12   VITAMIN D 25 Hydroxy (Vit-D Deficiency, Fractures)   TSH   Urinalysis, Routine w reflex microscopic   Urine Culture     Fall, initial encounter       Relevant Orders   CBC with Differential/Platelet   Comprehensive metabolic panel with GFR   Vitamin B12   VITAMIN D 25 Hydroxy (Vit-D Deficiency, Fractures)   TSH   Urinalysis, Routine w reflex microscopic   Urine Culture     Confusion       Relevant Orders   CBC with Differential/Platelet   Comprehensive metabolic panel with GFR    Vitamin B12   VITAMIN D 25 Hydroxy (Vit-D Deficiency, Fractures)   TSH   Urinalysis, Routine w reflex microscopic   Urine Culture          Fatigue and weakness Abrupt onset of fatigue and weakness over the past week without associated pain, fever, cough, congestion, or dysuria. Differential diagnosis includes occult infection, anemia, vitamin deficiency, or thyroid  dysfunction. Concern for possible occult infection, such as UTI, given the abrupt change in mobility and stability. Recent trip to the beach may have contributed to fatigue. - Order blood work including CBC, vitamin D, B12, and thyroid  panel. - Order urinalysis and urine culture to rule out UTI. - Advise to check for any skin infections, particularly on the back and buttocks. - treatment pending  Confusion Increased confusion over the past week without severe personality changes as seen in previous UTI episodes. Differential includes delirium due to infection, medication, or environmental change. Recent trip to the beach may have contributed to confusion. Consideration of delirium due to abrupt environmental change or infection. - Order blood work and urinalysis as above to rule out infection or vitamin deficiency.  Fall without injury Recent fall while using a walker, no injury sustained. Fall possibly related to weakness and confusion. Stability concerns due to weakness and fatigue. Supervision during ambulation is advised to prevent further falls.         No orders of the defined types were placed in this encounter.    Return if symptoms worsen or fail to improve.     I discussed the assessment and treatment plan with the patient. The patient was provided an opportunity to ask questions and all were answered. The patient agreed with the plan and demonstrated an understanding of the instructions.   The patient was advised to call back or seek an in-person evaluation if the symptoms worsen or if the condition fails  to improve as anticipated.  I provided 31 minutes of non-face-to-face time during this encounter.  Aden Agreste, MD [facility] [phone] (phone) [phone] (fax)  [facility]

## 2023-09-15 DIAGNOSIS — R410 Disorientation, unspecified: Secondary | ICD-10-CM | POA: Diagnosis not present

## 2023-09-15 DIAGNOSIS — W19XXXA Unspecified fall, initial encounter: Secondary | ICD-10-CM | POA: Diagnosis not present

## 2023-09-15 DIAGNOSIS — R5383 Other fatigue: Secondary | ICD-10-CM | POA: Diagnosis not present

## 2023-09-15 DIAGNOSIS — R5381 Other malaise: Secondary | ICD-10-CM | POA: Diagnosis not present

## 2023-09-15 DIAGNOSIS — R531 Weakness: Secondary | ICD-10-CM | POA: Diagnosis not present

## 2023-09-16 ENCOUNTER — Telehealth: Payer: Self-pay

## 2023-09-16 DIAGNOSIS — R748 Abnormal levels of other serum enzymes: Secondary | ICD-10-CM

## 2023-09-16 DIAGNOSIS — M5416 Radiculopathy, lumbar region: Secondary | ICD-10-CM | POA: Diagnosis not present

## 2023-09-16 DIAGNOSIS — R7989 Other specified abnormal findings of blood chemistry: Secondary | ICD-10-CM

## 2023-09-16 DIAGNOSIS — Z79899 Other long term (current) drug therapy: Secondary | ICD-10-CM | POA: Diagnosis not present

## 2023-09-16 LAB — CBC WITH DIFFERENTIAL/PLATELET
Basophils Absolute: 0.1 10*3/uL (ref 0.0–0.2)
Basos: 1 %
EOS (ABSOLUTE): 0.1 10*3/uL (ref 0.0–0.4)
Eos: 2 %
Hematocrit: 38.7 % (ref 34.0–46.6)
Hemoglobin: 12.9 g/dL (ref 11.1–15.9)
Immature Grans (Abs): 0 10*3/uL (ref 0.0–0.1)
Immature Granulocytes: 0 %
Lymphocytes Absolute: 2.2 10*3/uL (ref 0.7–3.1)
Lymphs: 34 %
MCH: 32.2 pg (ref 26.6–33.0)
MCHC: 33.3 g/dL (ref 31.5–35.7)
MCV: 97 fL (ref 79–97)
Monocytes Absolute: 0.5 10*3/uL (ref 0.1–0.9)
Monocytes: 8 %
Neutrophils Absolute: 3.6 10*3/uL (ref 1.4–7.0)
Neutrophils: 55 %
Platelets: 258 10*3/uL (ref 150–450)
RBC: 4.01 x10E6/uL (ref 3.77–5.28)
RDW: 11.5 % — ABNORMAL LOW (ref 11.7–15.4)
WBC: 6.4 10*3/uL (ref 3.4–10.8)

## 2023-09-16 LAB — URINALYSIS, ROUTINE W REFLEX MICROSCOPIC

## 2023-09-16 LAB — VITAMIN D 25 HYDROXY (VIT D DEFICIENCY, FRACTURES): Vit D, 25-Hydroxy: 18.5 ng/mL — ABNORMAL LOW (ref 30.0–100.0)

## 2023-09-16 LAB — COMPREHENSIVE METABOLIC PANEL WITH GFR
ALT: 11 IU/L (ref 0–32)
AST: 16 IU/L (ref 0–40)
Albumin: 3.9 g/dL (ref 3.6–4.6)
Alkaline Phosphatase: 133 IU/L — ABNORMAL HIGH (ref 44–121)
BUN/Creatinine Ratio: 18 (ref 12–28)
BUN: 31 mg/dL (ref 10–36)
Bilirubin Total: 0.3 mg/dL (ref 0.0–1.2)
CO2: 21 mmol/L (ref 20–29)
Calcium: 8.9 mg/dL (ref 8.7–10.3)
Chloride: 105 mmol/L (ref 96–106)
Creatinine, Ser: 1.71 mg/dL — ABNORMAL HIGH (ref 0.57–1.00)
Globulin, Total: 2.3 g/dL (ref 1.5–4.5)
Glucose: 122 mg/dL — ABNORMAL HIGH (ref 70–99)
Potassium: 5 mmol/L (ref 3.5–5.2)
Sodium: 141 mmol/L (ref 134–144)
Total Protein: 6.2 g/dL (ref 6.0–8.5)
eGFR: 27 mL/min/{1.73_m2} — ABNORMAL LOW (ref >59)

## 2023-09-16 LAB — URINE CULTURE

## 2023-09-16 LAB — VITAMIN B12: Vitamin B-12: 339 pg/mL (ref 232–1245)

## 2023-09-16 LAB — TSH: TSH: 0.852 u[IU]/mL (ref 0.450–4.500)

## 2023-09-16 NOTE — Telephone Encounter (Signed)
 Copied from CRM [id number]. Topic: Clinical - Lab/Test Results >> Sep 16, 2023 11:17 AM Chrystal Crape R wrote: Pt daughter calling for Urine lab results, please call back to explain results.

## 2023-09-16 NOTE — Telephone Encounter (Signed)
 Copied from CRM [id number]. Topic: Clinical - Lab/Test Results >> Sep 16, 2023  3:12 PM Luane Rumps D wrote: Reason for CRM: Patients daughter Ammon Bales called to go over lab results seen through MyChart, advised patient that Dr. David Escort has not yet reviewed lab results. Ammon Bales requested that she be called when the results have been reviewed.

## 2023-09-17 ENCOUNTER — Ambulatory Visit: Payer: Self-pay | Admitting: Family Medicine

## 2023-09-17 NOTE — Addendum Note (Signed)
 Addended by: D[place] End on: 09/17/2023 04:30 PM   Modules accepted: Orders

## 2023-09-17 NOTE — Telephone Encounter (Signed)
 See result note.

## 2023-09-17 NOTE — Telephone Encounter (Signed)
 Pt daughter advised per Dr.B verbalized understanding. Lab order placed  Becky Speed, MD 09/17/2023  3:17 PM EDT Back to Top    Normal/stable labs. UA is clear without signs of infection, but Urine culture still pending for confirmation.  Vit D level is low.  Recommend starting OTC Vit D3 1000-2000 units daily. There are signs of dehydration on labs, like increased creatinine and Alk phos. Recommend hydrating well and repeat CMP in 1-2 weeks.   Call daughter with results please

## 2023-09-20 LAB — URINALYSIS, ROUTINE W REFLEX MICROSCOPIC
Bilirubin, UA: NEGATIVE
Glucose, UA: NEGATIVE
Ketones, UA: NEGATIVE
Leukocytes,UA: NEGATIVE
Nitrite, UA: NEGATIVE
Protein,UA: NEGATIVE
RBC, UA: NEGATIVE
Specific Gravity, UA: 1.013 (ref 1.005–1.030)
Urobilinogen, Ur: 0.2 mg/dL (ref 0.2–1.0)
pH, UA: 6 (ref 5.0–7.5)

## 2023-09-20 LAB — URINE CULTURE

## 2023-10-01 DIAGNOSIS — M48062 Spinal stenosis, lumbar region with neurogenic claudication: Secondary | ICD-10-CM | POA: Diagnosis not present

## 2023-10-01 DIAGNOSIS — M5416 Radiculopathy, lumbar region: Secondary | ICD-10-CM | POA: Diagnosis not present

## 2023-10-25 ENCOUNTER — Ambulatory Visit (INDEPENDENT_AMBULATORY_CARE_PROVIDER_SITE_OTHER): Admitting: Family Medicine

## 2023-10-25 ENCOUNTER — Ambulatory Visit: Payer: Self-pay

## 2023-10-25 ENCOUNTER — Encounter: Payer: Self-pay | Admitting: Family Medicine

## 2023-10-25 VITALS — BP 154/75 | HR 65

## 2023-10-25 DIAGNOSIS — J189 Pneumonia, unspecified organism: Secondary | ICD-10-CM | POA: Diagnosis not present

## 2023-10-25 MED ORDER — AMOXICILLIN-POT CLAVULANATE 875-125 MG PO TABS: 1.0000 | ORAL_TABLET | Freq: Two times a day (BID) | ORAL | 0 refills | Status: AC

## 2023-10-25 MED ORDER — BENZONATATE 100 MG PO CAPS: 100.0000 mg | ORAL_CAPSULE | Freq: Two times a day (BID) | ORAL | 0 refills | Status: DC | PRN

## 2023-10-25 MED ORDER — AZITHROMYCIN 250 MG PO TABS: ORAL_TABLET | ORAL | 0 refills | Status: DC

## 2023-10-25 NOTE — Telephone Encounter (Signed)
 FYI Only or Action Required?: FYI only for provider.  Patient was last seen in primary care on 09/14/2023 by Myrla Jon HERO, MD. Called Nurse Triage reporting No chief complaint on file.. Symptoms began a week ago. Interventions attempted: OTC medications: OTC Flu Medications, Expectorant. Symptoms are: gradually worsening.  Triage Disposition: See Physician Within 24 Hours  Patient/caregiver understands and will follow disposition?: Yes  Copied from CRM [id number]. Topic: Clinical - Red Word Triage >> Oct 25, 2023  7:53 AM [place] MATSU wrote: Red Word that prompted transfer to Nurse Triage: fatigue, loss of appetite and cough dark phlegm and deep cough  Reason for Disposition  [1] Continuous (nonstop) coughing interferes with work or school AND [2] no improvement using cough treatment per Care Advice  Answer Assessment - Initial Assessment Questions 1. ONSET: When did the cough begin?      A week ago  2. SEVERITY: How bad is the cough today?      Deep Cough, Intermittent  3. SPUTUM: Describe the color of your sputum (none, dry cough; clear, white, yellow, green)     Dark Yellow  4. HEMOPTYSIS: Are you coughing up any blood? If so ask: How much? (flecks, streaks, tablespoons, etc.)     No  5. DIFFICULTY BREATHING: Are you having difficulty breathing? If Yes, ask: How bad is it? (e.g., mild, moderate, severe)    - MILD: No SOB at rest, mild SOB with walking, speaks normally in sentences, can lie down, no retractions, pulse < 100.    - MODERATE: SOB at rest, SOB with minimal exertion and prefers to sit, cannot lie down flat, speaks in phrases, mild retractions, audible wheezing, pulse 100-120.    - SEVERE: Very SOB at rest, speaks in single words, struggling to breathe, sitting hunched forward, retractions, pulse > 120      Denies  6. FEVER: Do you have a fever? If Yes, ask: What is your temperature, how was it measured, and when did it start?     No  7. CARDIAC  HISTORY: Do you have any history of heart disease? (e.g., heart attack, congestive heart failure)      No  8. LUNG HISTORY: Do you have any history of lung disease?  (e.g., pulmonary embolus, asthma, emphysema)     No  9. PE RISK FACTORS: Do you have a history of blood clots? (or: recent major surgery, recent prolonged travel, bedridden)     No  10. OTHER SYMPTOMS: Do you have any other symptoms? (e.g., runny nose, wheezing, chest pain)       Headache, Runny Nose, Voice Changes, Loss of appetite  11. PREGNANCY: Is there any chance you are pregnant? When was your last menstrual period?       No and No  12. TRAVEL: Have you traveled out of the country in the last month? (e.g., travel history, exposures)       Daughter had similar symptoms first  Protocols used: Cough - Acute Productive-A-AH

## 2023-10-25 NOTE — Progress Notes (Signed)
 Acute visit   Patient: Becky Prince   DOB: [birth year]   89 y.o. Female  MRN: [id number] PCP: Myrla Jon HERO, MD   Chief Complaint  Patient presents with   Cough    Deep productive cough associated with runny nose, nasal and chest congestion, weakness, lack of appetite X 8 days. Reports symptoms are worsening. Patient is taking otc cold, flu, cough medicines. Sputum is dark/yellow green. Reports cough was initially dry and has become productive over the last few days   Subjective    Discussed the use of AI scribe software for clinical note transcription with the patient, who gave verbal consent to proceed.  History of Present Illness   Becky Prince is a 89 year old female who presents with worsening respiratory symptoms over the past eight days.  She experiences rhinorrhea, nasal and chest congestion, and a worsening cough over the past eight days. The cough, initially dry, is now productive with intense episodes. She has decreased appetite and general weakness. Over-the-counter medications have not provided significant relief. She feels hot due to external temperature but has no fever. Current medications include Nyquil for severe coughing episodes. She has difficulty hearing but no ear pain. She had RSV two years ago.        Review of Systems  Objective    BP (!) 154/75 (BP Location: Left Arm, Patient Position: Sitting, Cuff Size: Normal)   Pulse 65  Physical Exam Vitals reviewed.  Constitutional:      General: She is not in acute distress.    Appearance: Normal appearance. She is well-developed. She is not diaphoretic.  HENT:     Head: Normocephalic and atraumatic.     Right Ear: External ear normal.     Left Ear: External ear normal.     Nose: Nose normal.     Mouth/Throat:     Mouth: Mucous membranes are moist.     Pharynx: Oropharynx is clear. No oropharyngeal exudate.   Eyes:     General: No scleral icterus.    Conjunctiva/sclera:  Conjunctivae normal.   Neck:     Thyroid : No thyromegaly.   Cardiovascular:     Rate and Rhythm: Normal rate and regular rhythm.     Heart sounds: Murmur heard.  Pulmonary:     Effort: Pulmonary effort is normal. No respiratory distress.     Breath sounds: Rhonchi (b/l bases R>L) present. No wheezing or rales.   Musculoskeletal:     Cervical back: Neck supple.     Right lower leg: No edema.     Left lower leg: No edema.  Lymphadenopathy:     Cervical: No cervical adenopathy.   Skin:    General: Skin is warm and dry.     Findings: No rash.   Neurological:     Mental Status: She is alert and oriented to person, place, and time. Mental status is at baseline.   Psychiatric:        Mood and Affect: Mood normal.        Behavior: Behavior normal.       No results found for any visits on 10/25/23.  Assessment & Plan     Problem List Items Addressed This Visit   None Visit Diagnoses       Community acquired pneumonia, unspecified laterality    -  Primary   Relevant Medications   amoxicillin-clavulanate (AUGMENTIN) 875-125 MG tablet   azithromycin (ZITHROMAX) 250 MG tablet  benzonatate  (TESSALON ) 100 MG capsule           Community-acquired pneumonia with acute cough and nasal/chest congestion Persistent symptoms for eight days with worsening nasal and chest congestion, dry cough progressing to productive cough, and weakness. Coarse breath sounds, particularly on the right side, suggest possible bacterial pneumonia. Empirical treatment initiated due to symptom duration, progression, and coarse breath sounds, despite potential viral etiology. Chest x-ray deferred due to anticipated delay in results. - Prescribe azithromycin: 2 pills on the first day, then 1 pill daily for 4 days. - Prescribe Augmentin: twice daily for 7 days. - Prescribe Tessalon  for cough management. - Advise taking antibiotics with food to minimize gastrointestinal upset. - Encourage adequate  hydration. - Monitor symptoms and report if no improvement by Thursday or Friday.       Meds ordered this encounter  Medications   amoxicillin-clavulanate (AUGMENTIN) 875-125 MG tablet    Sig: Take 1 tablet by mouth 2 (two) times daily for 7 days.    Dispense:  14 tablet    Refill:  0   azithromycin (ZITHROMAX) 250 MG tablet    Sig: Take 500mg  PO daily x1d and then 250mg  daily x4 days    Dispense:  6 each    Refill:  0   benzonatate  (TESSALON ) 100 MG capsule    Sig: Take 1 capsule (100 mg total) by mouth 2 (two) times daily as needed for cough.    Dispense:  20 capsule    Refill:  0     Return if symptoms worsen or fail to improve.      Jon Eva, MD  [facility] [phone] (phone) [phone] (fax)  [facility]

## 2023-10-25 NOTE — Telephone Encounter (Signed)
 Noted

## 2023-10-28 ENCOUNTER — Emergency Department
Admission: EM | Admit: 2023-10-28 | Discharge: 2023-10-28 | Disposition: A | Discharge status: Home / Self Care | Attending: Emergency Medicine | Admitting: Emergency Medicine

## 2023-10-28 ENCOUNTER — Other Ambulatory Visit: Payer: Self-pay

## 2023-10-28 ENCOUNTER — Ambulatory Visit: Admitting: Physician Assistant

## 2023-10-28 ENCOUNTER — Ambulatory Visit: Payer: Self-pay

## 2023-10-28 ENCOUNTER — Emergency Department

## 2023-10-28 VITALS — BP 132/73 | HR 82 | Resp 16

## 2023-10-28 DIAGNOSIS — R109 Unspecified abdominal pain: Secondary | ICD-10-CM | POA: Insufficient documentation

## 2023-10-28 DIAGNOSIS — R54 Age-related physical debility: Secondary | ICD-10-CM | POA: Diagnosis not present

## 2023-10-28 DIAGNOSIS — I10 Essential (primary) hypertension: Secondary | ICD-10-CM | POA: Diagnosis not present

## 2023-10-28 DIAGNOSIS — R197 Diarrhea, unspecified: Secondary | ICD-10-CM | POA: Insufficient documentation

## 2023-10-28 DIAGNOSIS — I771 Stricture of artery: Secondary | ICD-10-CM | POA: Diagnosis not present

## 2023-10-28 DIAGNOSIS — K7689 Other specified diseases of liver: Secondary | ICD-10-CM | POA: Diagnosis not present

## 2023-10-28 DIAGNOSIS — R059 Cough, unspecified: Secondary | ICD-10-CM | POA: Diagnosis not present

## 2023-10-28 DIAGNOSIS — R400 Somnolence: Secondary | ICD-10-CM | POA: Diagnosis not present

## 2023-10-28 DIAGNOSIS — R918 Other nonspecific abnormal finding of lung field: Secondary | ICD-10-CM | POA: Diagnosis not present

## 2023-10-28 DIAGNOSIS — J189 Pneumonia, unspecified organism: Secondary | ICD-10-CM | POA: Diagnosis not present

## 2023-10-28 DIAGNOSIS — R051 Acute cough: Secondary | ICD-10-CM | POA: Diagnosis not present

## 2023-10-28 DIAGNOSIS — R531 Weakness: Secondary | ICD-10-CM | POA: Diagnosis not present

## 2023-10-28 LAB — COMPREHENSIVE METABOLIC PANEL WITH GFR
ALT: 8 U/L (ref 0–44)
AST: 13 U/L — ABNORMAL LOW (ref 15–41)
Albumin: 3.4 g/dL — ABNORMAL LOW (ref 3.5–5.0)
Alkaline Phosphatase: 90 U/L (ref 38–126)
Anion gap: 10 (ref 5–15)
BUN: 33 mg/dL — ABNORMAL HIGH (ref 8–23)
CO2: 22 mmol/L (ref 22–32)
Calcium: 8.7 mg/dL — ABNORMAL LOW (ref 8.9–10.3)
Chloride: 105 mmol/L (ref 98–111)
Creatinine, Ser: 1.57 mg/dL — ABNORMAL HIGH (ref 0.44–1.00)
GFR, Estimated: 30 mL/min — ABNORMAL LOW (ref >60)
Glucose, Bld: 96 mg/dL (ref 70–99)
Potassium: 4.5 mmol/L (ref 3.5–5.1)
Sodium: 137 mmol/L (ref 135–145)
Total Bilirubin: 0.6 mg/dL (ref 0.0–1.2)
Total Protein: 7 g/dL (ref 6.5–8.1)

## 2023-10-28 LAB — CBC
HCT: 42.8 % (ref 36.0–46.0)
Hemoglobin: 13.8 g/dL (ref 12.0–15.0)
MCH: 31 pg (ref 26.0–34.0)
MCHC: 32.2 g/dL (ref 30.0–36.0)
MCV: 96.2 fL (ref 80.0–100.0)
Platelets: 302 10*3/uL (ref 150–400)
RBC: 4.45 MIL/uL (ref 3.87–5.11)
RDW: 12.1 % (ref 11.5–15.5)
WBC: 7.3 10*3/uL (ref 4.0–10.5)
nRBC: 0 % (ref 0.0–0.2)

## 2023-10-28 LAB — GASTROINTESTINAL PANEL BY PCR, STOOL (REPLACES STOOL CULTURE)

## 2023-10-28 LAB — URINALYSIS, ROUTINE W REFLEX MICROSCOPIC
Bilirubin Urine: NEGATIVE
Glucose, UA: NEGATIVE mg/dL
Hgb urine dipstick: NEGATIVE
Ketones, ur: 5 mg/dL — AB
Leukocytes,Ua: NEGATIVE
Nitrite: NEGATIVE
Protein, ur: NEGATIVE mg/dL
Specific Gravity, Urine: 1.012 (ref 1.005–1.030)
pH: 5 (ref 5.0–8.0)

## 2023-10-28 LAB — SAMPLE TO BLOOD BANK

## 2023-10-28 LAB — C DIFFICILE QUICK SCREEN W PCR REFLEX
C Diff antigen: NEGATIVE
C Diff interpretation: NOT DETECTED
C Diff toxin: NEGATIVE

## 2023-10-28 LAB — LIPASE, BLOOD: Lipase: 28 U/L (ref 11–51)

## 2023-10-28 MED ORDER — SODIUM CHLORIDE 0.9 % IV BOLUS
1000.0000 mL | Freq: Once | INTRAVENOUS | Status: AC
  Administered 2023-10-28: 1000 mL via INTRAVENOUS

## 2023-10-28 NOTE — Progress Notes (Signed)
 Established patient visit  Patient: Becky Prince   DOB: [birth year]   89 y.o. Female  MRN: [id number] Visit Date: 10/28/2023  Today's healthcare provider: Jolynn Spencer, PA-C   Chief Complaint  Patient presents with   Diarrhea    Diarrhea   Subjective     Discussed the use of AI scribe software for clinical note transcription with the patient, who gave verbal consent to proceed.  History of Present Illness Becky Prince is a 89 year old female who presents with persistent diarrhea following antibiotic treatment for pneumonia.  She began experiencing continuous diarrhea, described as liquid and almost black, occurring every one to two hours after starting two courses of antibiotics for pneumonia. The diarrhea persists despite the use of Imodium and Bismuth tablets. Significant weakness and instability are present, with an inability to stand for more than a minute or two, even with a walker. Her daughter is providing electrolytes and monitoring her condition but is exhausted and concerned about her frailty and potential dehydration. There is no current pain, discomfort, or fever. She has a history of falls, including a fractured pelvis.       05/21/2023   12:00 PM 02/04/2023    2:48 PM 11/13/2022   10:37 AM  Depression screen PHQ 2/9  Decreased Interest 0 0 0  Down, Depressed, Hopeless 0 0 0  PHQ - 2 Score 0 0 0  Altered sleeping 1 0 1  Tired, decreased energy 1 1 0  Change in appetite 0 1 0  Feeling bad or failure about yourself  0 0 0  Trouble concentrating 0 0 0  Moving slowly or fidgety/restless 0 0 0  Suicidal thoughts 0 0 0  PHQ-9 Score 2 2 1   Difficult doing work/chores Not difficult at all Somewhat difficult Not difficult at all      05/21/2023   12:00 PM 02/04/2023    2:48 PM  GAD 7 : Generalized Anxiety Score  Nervous, Anxious, on Edge 0 0  Control/stop worrying 0 0  Worry too much - different things 0 1  Trouble relaxing 0 0  Restless 0 0   Easily annoyed or irritable 0 0  Afraid - awful might happen 0 0  Total GAD 7 Score 0 1  Anxiety Difficulty Not difficult at all Somewhat difficult    Medications: Outpatient Medications Prior to Visit  Medication Sig   amoxicillin -clavulanate (AUGMENTIN ) 875-125 MG tablet Take 1 tablet by mouth 2 (two) times daily for 7 days.   atorvastatin  (LIPITOR) 40 MG tablet TAKE 1 TABLET BY MOUTH EVERY DAY   azithromycin  (ZITHROMAX ) 250 MG tablet Take 500mg  PO daily x1d and then 250mg  daily x4 days   benzonatate  (TESSALON ) 100 MG capsule Take 1 capsule (100 mg total) by mouth 2 (two) times daily as needed for cough.   citalopram  (CELEXA ) 20 MG tablet TAKE 1 TABLET BY MOUTH EVERY DAY   donepezil  (ARICEPT ) 10 MG tablet TAKE 1 TABLET BY MOUTH EVERYDAY AT BEDTIME   fluticasone  (FLONASE ) 50 MCG/ACT nasal spray Place 2 sprays into both nostrils daily.   HYDROcodone-acetaminophen  (NORCO/VICODIN) 5-325 MG tablet Take 1 tablet by mouth every 6 (six) hours as needed for moderate pain (pain score 4-6).   mirtazapine  (REMERON ) 15 MG tablet TAKE 1 TABLET BY MOUTH EVERYDAY AT BEDTIME   spironolactone  (ALDACTONE ) 25 MG tablet Take 1 tablet (25 mg total) by mouth daily.   traMADol  (ULTRAM ) 50 MG tablet Take 50 mg by mouth every 6 (six) hours  as needed.   No facility-administered medications prior to visit.    Review of Systems All negative Except see HPI       Objective    There were no vitals taken for this visit.    Physical Exam Vitals reviewed.  Constitutional:      General: She is not in acute distress.    Appearance: Normal appearance. She is well-developed. She is not diaphoretic.  HENT:     Head: Normocephalic and atraumatic.  Eyes:     General: No scleral icterus.    Conjunctiva/sclera: Conjunctivae normal.  Neck:     Thyroid : No thyromegaly.  Cardiovascular:     Rate and Rhythm: Normal rate and regular rhythm.     Pulses: Normal pulses.     Heart sounds: Normal heart sounds. No  murmur heard. Pulmonary:     Effort: Pulmonary effort is normal. No respiratory distress.     Breath sounds: Normal breath sounds. No wheezing, rhonchi or rales.  Musculoskeletal:     Cervical back: Neck supple.     Right lower leg: No edema.     Left lower leg: No edema.  Lymphadenopathy:     Cervical: No cervical adenopathy.  Skin:    General: Skin is warm and dry.     Findings: No rash.  Neurological:     Mental Status: She is alert and oriented to person, place, and time. Mental status is at baseline.  Psychiatric:        Mood and Affect: Mood normal.        Behavior: Behavior normal.      No results found for any visits on 10/28/23.      Assessment & Plan Diarrhea  could be secondary to antibiotic use Continuous liquid diarrhea likely antibiotic-associated. Frailty necessitates further evaluation. Advised to proceed with labs however, due to care taker burden, pt and her daughter preferred to proceed with ER for further evaluation - Refer to emergency room for evaluation and management. - Ensure hydration with water and electrolytes. - Document concerns and recommendations for emergency room staff.  Frailty in elderly patient At 89, she shows frailty signs exacerbated by pneumonia and diarrhea, increasing dehydration risk. - Ensure hydration and electrolyte balance.  Pneumonia Diagnosed on June 30, showing improvement with reduced coughing and no fever. Frailty and diarrhea complicate recovery. Normal vitals and normal PE - Ensure follow-up in the emergency room for potential chest x-ray and further evaluation.  Weakness Possible urinary tract infection (UTI) Possible dehydration Increased sleepiness and weakness could indicate UTI. Urinalysis necessary to rule out UTI. - Perform urinalysis in the emergency room to rule out UTI. Pt and her care taker preferred for all labs to be done at ER Drink adequate water intake with electrolytes PO intake is difficult for  pt Advised to proceed to ED for IV hydration Before the holiday  In the setting CKD with GFR on 09/14/23 of 27, mixed dementia, history of TIA in 2018  No orders of the defined types were placed in this encounter.   No follow-ups on file.   The patient was advised to call back or seek an in-person evaluation if the symptoms worsen or if the condition fails to improve as anticipated.  I discussed the assessment and treatment plan with the patient. The patient was provided an opportunity to ask questions and all were answered. The patient agreed with the plan and demonstrated an understanding of the instructions.  I, Damain Broadus, PA-C have reviewed all  documentation for this visit. The documentation on 10/28/2023  for the exam, diagnosis, procedures, and orders are all accurate and complete.  I spent 30 minutes dedicated to the care of this patient on the date of this encounter to include pre-visit review of records, face-to-face with the patient discussing condition/treatment for diarrhea, weakness, pneumonia, dehydration in the setting of CKD, mixed dementia in a frail 89 yo female patient., and post visit completing of documentation. Also, discussed an ability of pt's caretaker to proceed with treatment OP  Makari Portman, [facility], MMS [facility] [phone] (phone) [phone] (fax)  [facility]

## 2023-10-28 NOTE — ED Triage Notes (Addendum)
 Pt comes with c/o diarrhea. Pt was recently dx with pneumonia and placed on antibiotics. Pt now having diarrhea. Pt states she might be dehydrated as well. Pt denies any sob.   Daughter reports pt has not been drinking much. Pt has been having severe diarrhea since Monday night. Pt also have dark colored. Pt might have UTI also per family. Pt is not on thinners.  Daughter also concerned she can't care for her and may need assistance with placement.

## 2023-10-28 NOTE — ED Provider Notes (Signed)
 [facility] Provider Note    Event Date/Time   First MD Initiated Contact with Patient 10/28/23 1105     (approximate)   History   Diarrhea   HPI  Becky Prince is a 89 year old female with history of constipation presenting to the emergency department for evaluation of diarrhea.  Patient recently seen by her primary care doctor for cough and upper respiratory symptoms.  Did not have an x-Isaih Bulger performed, was placed on antibiotics for presumed pneumonia.  Shortly after, patient had onset of multiple episodes of diarrhea.  Patient has had poor p.o. intake.  Family concerned about possible UTI.  Patient significantly weaker than her baseline.    Physical Exam   Triage Vital Signs: ED Triage Vitals  Encounter Vitals Group     BP 10/28/23 1105 131/69     Girls Systolic BP Percentile --      Girls Diastolic BP Percentile --      Boys Systolic BP Percentile --      Boys Diastolic BP Percentile --      Pulse Rate 10/28/23 1105 75     Resp 10/28/23 1105 19     Temp 10/28/23 1105 98.4 F (36.9 C)     Temp src --      SpO2 10/28/23 1105 94 %     Weight --      Height --      Head Circumference --      Peak Flow --      Pain Score 10/28/23 1101 0     Pain Loc --      Pain Education --      Exclude from Growth Chart --     Most recent vital signs: Vitals:   10/28/23 1430 10/28/23 1500  BP: (!) 144/66 (!) 164/79  Pulse:    Resp: 19 19  Temp:    SpO2:       General: Awake, interactive  CV:  Regular rate, good peripheral perfusion.  Resp:  Unlabored respirations, lung sounds mildly diminished at bases Abd:  Nondistended, soft, no significant tenderness to palpation Neuro:  Symmetric facial movement, fluid speech   ED Results / Procedures / Treatments   Labs (all labs ordered are listed, but only abnormal results are displayed) Labs Reviewed  COMPREHENSIVE METABOLIC PANEL WITH GFR - Abnormal; Notable for the following components:      Result  Value   BUN 33 (*)    Creatinine, Ser 1.57 (*)    Calcium  8.7 (*)    Albumin 3.4 (*)    AST 13 (*)    GFR, Estimated 30 (*)    All other components within normal limits  C DIFFICILE QUICK SCREEN W PCR REFLEX    GASTROINTESTINAL PANEL BY PCR, STOOL (REPLACES STOOL CULTURE)  CBC  LIPASE, BLOOD  URINALYSIS, ROUTINE W REFLEX MICROSCOPIC  SAMPLE TO BLOOD BANK     EKG EKG independently reviewed and interpreted by myself demonstrates:    RADIOLOGY Imaging independently reviewed and interpreted by myself demonstrates:  CXR without focal consolidation CT abdomen pelvis pending  Formal Radiology Read:  DG Chest Portable 1 View Result Date: 10/28/2023 CLINICAL DATA:  Cough EXAM: PORTABLE CHEST 1 VIEW COMPARISON:  X-Lakitha Gordy 01/23/2023 FINDINGS: Mild opacity left lung base. Atelectasis versus infiltrate. No pneumothorax, effusion or edema. Enlarged cardiopericardial silhouette. Calcified tortuous aorta. Overlapping cardiac leads. Osteopenia and degenerative changes are seen. IMPRESSION: Mild left lung base opacity. Infiltrates possible. Recommend follow-up. Electronically Signed   By:  Ranell Bring M.D.   On: 10/28/2023 11:41    PROCEDURES:  Critical Care performed: No  Procedures   MEDICATIONS ORDERED IN ED: Medications  sodium chloride  0.9 % bolus 1,000 mL (0 mLs Intravenous Stopped 10/28/23 1217)     IMPRESSION / MDM / ASSESSMENT AND PLAN / ED COURSE  I reviewed the triage vital signs and the nursing notes.  Differential diagnosis includes, but is not limited to, antibiotic adverse effect, C. difficile, other viral or bacterial diarrheal process, colitis, other acute intra-abdominal process, anemia, electrolyte abnormality, refractory pneumonia  Patient's presentation is most consistent with acute presentation with potential threat to life or bodily function.  89 year old female presenting with cough, diarrhea, weakness.  Stable vitals on presentation.  Reassuring CBC.  CMP without  significant derangement.  Normal lipase.  C. difficile testing negative, GI panel pending.  Chest x-Yomar Mejorado does demonstrate ongoing pneumonia, concern for possible refractory pneumonia given persistent weakness despite antibiotics.  CT abdomen pelvis pending.  Signed out to oncoming physician at 1530 pending completion of CT, reevaluation, and disposition.      FINAL CLINICAL IMPRESSION(S) / ED DIAGNOSES   Final diagnoses:  Diarrhea, unspecified type  Acute cough     Rx / DC Orders   ED Discharge Orders     None        Note:  This document was prepared using Dragon voice recognition software and may include unintentional dictation errors.   Levander Slate, MD 10/28/23 [phone]

## 2023-10-28 NOTE — ED Provider Notes (Signed)
 Patient's ct abd/pel without concerning acute findings. No evidence of pneumonia noted in lung bases on CT scan. No leukocytosis. Patient's urine without findings concerning for infection. Discussed findings with the patient and family. At this time no indication for bacterial infection. Do think it reasonable for patient to be discharged.    Floy Roberts, MD 10/28/23 [phone]

## 2023-10-28 NOTE — Telephone Encounter (Signed)
 FYI Only or Action Required?: FYI only for provider.  Patient was last seen in primary care on 10/25/2023 by Myrla Jon HERO, MD. Called Nurse Triage reporting Rectal Bleeding. Symptoms began several days ago. Interventions attempted: OTC medications: Bismuth Imodium, Prescription medications: Azithromycin, Azithromycin, and Rest, hydration, or home remedies. Symptoms are: gradually worsening.  Triage Disposition: See Physician Within 24 Hours, Home Care  Patient/caregiver understands and will follow disposition?: Yes        Copied from CRM [id number]. Topic: Clinical - Red Word Triage >> Oct 28, 2023  8:18 AM Treva T wrote: Red Word that prompted transfer to Nurse Triage: Patient daughter Regina verified), states patient was seen by PCP provider on Monday, was prescribed two different antibiotics for pneumonia. Per daughter, states since starting medication, patient is having increased watery, black diarrhea, and nauseated, weakness. Reason for Disposition  Unusual stool color probably from food or medicine  MODERATE diarrhea (e.g., 4-6 times / day more than normal)  Answer Assessment - Initial Assessment Questions 1. COLOR: What color is it? Is that color in part or all of the stool?     Black, very dark  2. ONSET: When was the unusual color first noted?     Yesterday  3. CAUSE: Have you eaten any food or taken any medicine of this color? Note: See listing in Background Information section.      Took some bismuth  4. OTHER SYMPTOMS: Do you have any other symptoms? (e.g., abdomen pain, diarrhea, jaundice, fever).     Nausea and weakness   Started 2 antibiotics (Azithromycin and Augmentin) 3 nights ago, daughter gave Immodium and Bismuth on Tuesday night  Answer Assessment - Initial Assessment Questions 1. ANTIBIOTIC: What antibiotic are you taking? How many times per day?     Augmentin and Azithromycin  2. ANTIBIOTIC ONSET: When was the antibiotic  started?     6/30   3. DIARRHEA SEVERITY: How bad is the diarrhea? How many more stools have you had in the past 24 hours than normal?    - NO DIARRHEA (SCALE 0)   - MILD (SCALE 1-3): Few loose or mushy BMs; increase of 1-3 stools over normal daily number of stools; mild increase in ostomy output.   -  MODERATE (SCALE 4-7): Increase of 4-6 stools daily over normal; moderate increase in ostomy output. * SEVERE (SCALE 8-10; OR 'WORST POSSIBLE'): Increase of 7 or more stools daily over normal; moderate increase in ostomy output; incontinence.     Moderate  4. ONSET: When did the diarrhea begin?       5. BM CONSISTENCY: How loose or watery is the diarrhea?      Watery  6. VOMITING: Are you also vomiting? If Yes, ask: How many times in the past 24 hours?      No  7. ABDOMEN PAIN: Are you having any abdomen pain? If Yes, ask: What does it feel like? (e.g., crampy, dull, intermittent, constant)      No  8. ABDOMEN PAIN SEVERITY: If present, ask: How bad is the pain?  (e.g., Scale 1-10; mild, moderate, or severe)   - MILD (1-3): doesn't interfere with normal activities, abdomen soft and not tender to touch    - MODERATE (4-7): interferes with normal activities or awakens from sleep, abdomen tender to touch    - SEVERE (8-10): excruciating pain, doubled over, unable to do any normal activities       No  9. ORAL INTAKE: If vomiting, Have you been  able to drink liquids? How much liquids have you had in the past 24 hours?     N/a  10. HYDRATION: Any signs of dehydration? (e.g., dry mouth [not just dry lips], too weak to stand, dizziness, new weight loss) When did you last urinate?       Some weakness  11. EXPOSURE: Have you traveled to a foreign country recently? Have you been exposed to anyone with diarrhea? Could you have eaten any food that was spoiled?       *No Answer* 12. OTHER SYMPTOMS: Do you have any other symptoms? (e.g., fever, blood in stool)        Nausea  13. PREGNANCY: Is there any chance you are pregnant? When was your last menstrual period?       *No Answer*  Protocols used: Stools - Unusual Color-A-AH, Diarrhea on Antibiotics-A-AH

## 2023-11-01 ENCOUNTER — Encounter: Payer: Self-pay | Admitting: Physician Assistant

## 2023-11-01 DIAGNOSIS — R197 Diarrhea, unspecified: Secondary | ICD-10-CM | POA: Insufficient documentation

## 2023-11-01 DIAGNOSIS — R531 Weakness: Secondary | ICD-10-CM | POA: Insufficient documentation

## 2023-11-01 DIAGNOSIS — R54 Age-related physical debility: Secondary | ICD-10-CM | POA: Insufficient documentation

## 2023-11-01 DIAGNOSIS — J189 Pneumonia, unspecified organism: Secondary | ICD-10-CM | POA: Insufficient documentation

## 2023-11-01 DIAGNOSIS — R400 Somnolence: Secondary | ICD-10-CM | POA: Insufficient documentation

## 2023-11-16 ENCOUNTER — Ambulatory Visit: Payer: PPO

## 2023-11-16 DIAGNOSIS — Z0000 Encounter for general adult medical examination without abnormal findings: Secondary | ICD-10-CM | POA: Diagnosis not present

## 2023-11-16 NOTE — Patient Instructions (Signed)
 Becky Prince , Thank you for taking time out of your busy schedule to complete your Annual Wellness Visit with me. I enjoyed our conversation and look forward to speaking with you again next year. I, as well as your care team,  appreciate your ongoing commitment to your health goals. Please review the following plan we discussed and let me know if I can assist you in the future.   Follow up Visits: Next Medicare AWV with our clinical staff:   11/21/24 @ 1:10 PM BY PHONE Have you seen your provider in the last 6 months (3 months if uncontrolled diabetes)? Yes   Clinician Recommendations:  Aim for 30 minutes of exercise or brisk walking, 6-8 glasses of water, and 5 servings of fruits and vegetables each day. TAKE CARE!      This is a list of the screening recommended for you and due dates:  Health Maintenance  Topic Date Due   Zoster (Shingles) Vaccine (1 of 2) 12/17/1944   DTaP/Tdap/Td vaccine (4 - Td or Tdap) 01/09/2021   COVID-19 Vaccine (8 - 2024-25 season) 12/27/2022   Flu Shot  11/26/2023   Medicare Annual Wellness Visit  11/15/2024   Pneumococcal Vaccine for age over 31  Completed   DEXA scan (bone density measurement)  Completed   Hepatitis B Vaccine  Aged Out   HPV Vaccine  Aged Out   Meningitis B Vaccine  Aged Out    Advanced directives: (In Chart) A copy of your advanced directives are scanned into your chart should your provider ever need it. Advance Care Planning is important because it:  [x]  Makes sure you receive the medical care that is consistent with your values, goals, and preferences  [x]  It provides guidance to your family and loved ones and reduces their decisional burden about whether or not they are making the right decisions based on your wishes.  Follow the link provided in your after visit summary or read over the paperwork we have mailed to you to help you started getting your Advance Directives in place. If you need assistance in completing these, please reach  out to us  so that we can help you!

## 2023-11-16 NOTE — Progress Notes (Signed)
 Subjective:   Becky Prince is a 89 y.o. who presents for a Medicare Wellness preventive visit.  As a reminder, Annual Wellness Visits don't include a physical exam, and some assessments may be limited, especially if this visit is performed virtually. We may recommend an in-person follow-up visit with your provider if needed.  Visit Complete: Virtual I connected with  Becky Prince on 11/16/23 by a audio enabled telemedicine application and verified that I am speaking with the correct person using two identifiers.  Patient Location: Home  Provider Location: Office/Clinic  I discussed the limitations of evaluation and management by telemedicine. The patient expressed understanding and agreed to proceed.  Vital Signs: Because this visit was a virtual/telehealth visit, some criteria may be missing or patient reported. Any vitals not documented were not able to be obtained and vitals that have been documented are patient reported.  VideoDeclined- This patient declined Librarian, academic. Therefore the visit was completed with audio only.  Persons Participating in Visit: Patient assisted by daughter.  AWV Questionnaire: No: Patient Medicare AWV questionnaire was not completed prior to this visit.  Cardiac Risk Factors include: advanced age (>2men, >19 women);hypertension;dyslipidemia;sedentary lifestyle     Objective:    Today's Vitals   11/16/23 1357  PainSc: 0-No pain   There is no height or weight on file to calculate BMI.     11/16/2023    2:04 PM 10/28/2023   11:01 AM 01/23/2023    9:19 AM 12/17/2021    7:30 AM 07/01/2020    2:07 PM 10/04/2019    5:45 AM 10/03/2019    6:24 PM  Advanced Directives  Does Patient Have a Medical Advance Directive? No No Yes No Yes Yes Yes  Type of Surveyor, minerals;Living will  Healthcare Power of [place];Living will Living will Living will  Does patient want to make changes to medical  advance directive?      No - Patient declined   Copy of Healthcare Power of Attorney in Chart?     No - copy requested    Would patient like information on creating a medical advance directive? No - Patient declined     No - Patient declined No - Patient declined    Current Medications (verified) Outpatient Encounter Medications as of 11/16/2023  Medication Sig   atorvastatin  (LIPITOR) 40 MG tablet TAKE 1 TABLET BY MOUTH EVERY DAY   donepezil  (ARICEPT ) 10 MG tablet TAKE 1 TABLET BY MOUTH EVERYDAY AT BEDTIME   fluticasone  (FLONASE ) 50 MCG/ACT nasal spray Place 2 sprays into both nostrils daily.   HYDROcodone-acetaminophen  (NORCO/VICODIN) 5-325 MG tablet Take 1 tablet by mouth every 6 (six) hours as needed for moderate pain (pain score 4-6).   mirtazapine  (REMERON ) 15 MG tablet TAKE 1 TABLET BY MOUTH EVERYDAY AT BEDTIME   spironolactone  (ALDACTONE ) 25 MG tablet Take 1 tablet (25 mg total) by mouth daily.   traMADol  (ULTRAM ) 50 MG tablet Take 50 mg by mouth every 6 (six) hours as needed.   azithromycin  (ZITHROMAX ) 250 MG tablet Take 500mg  PO daily x1d and then 250mg  daily x4 days (Patient not taking: Reported on 11/16/2023)   benzonatate  (TESSALON ) 100 MG capsule Take 1 capsule (100 mg total) by mouth 2 (two) times daily as needed for cough. (Patient not taking: Reported on 11/16/2023)   citalopram  (CELEXA ) 20 MG tablet TAKE 1 TABLET BY MOUTH EVERY DAY   No facility-administered encounter medications on file as of 11/16/2023.  Allergies (verified) Naproxen, Oxycodone , Oxycodone -aspirin , and Prednisone   History: Past Medical History:  Diagnosis Date   Depression    Environmental allergies    Hypercholesteremia    Hypertension    Past Surgical History:  Procedure Laterality Date   ABDOMINAL HYSTERECTOMY     CATARACT EXTRACTION W/PHACO Right 06/12/2015   Procedure: CATARACT EXTRACTION PHACO AND INTRAOCULAR LENS PLACEMENT (IOC);  Surgeon: Dene Etienne, MD;  [facility]: [facility];  Service: Ophthalmology;  Laterality: Right;   Family History  Problem Relation Age of Onset   Heart disease Mother        CHF   Hypertension Mother    Cancer Father        prostate   Cancer Sister        breast   Kidney disease Brother    Breast cancer Neg Hx    Social History   Socioeconomic History   Marital status: Widowed    Spouse name: Not on file   Number of children: 1   Years of education: Not on file   Highest education level: 12th grade  Occupational History   Occupation: retired  Tobacco Use   Smoking status: Never   Smokeless tobacco: Never  Vaping Use   Vaping status: Never Used  Substance and Sexual Activity   Alcohol use: No    Alcohol/week: 0.0 standard drinks of alcohol   Drug use: No   Sexual activity: Not on file  Other Topics Concern   Not on file  Social History Narrative   Not on file   Social Drivers of Health   Financial Resource Strain: Low Risk  (11/16/2023)   Overall Financial Resource Strain (CARDIA)    Difficulty of Paying Living Expenses: Not hard at all  Food Insecurity: No Food Insecurity (11/16/2023)   Hunger Vital Sign    Worried About Running Out of Food in the Last Year: Never true    Ran Out of Food in the Last Year: Never true  Transportation Needs: No Transportation Needs (11/16/2023)   PRAPARE - Administrator, Civil Service (Medical): No    Lack of Transportation (Non-Medical): No  Physical Activity: Insufficiently Active (11/16/2023)   Exercise Vital Sign    Days of Exercise per Week: 2 days    Minutes of Exercise per Session: 20 min  Stress: No Stress Concern Present (11/16/2023)   [employer] of Occupational Health - Occupational Stress Questionnaire    Feeling of Stress: Not at all  Social Connections: Socially Isolated (11/16/2023)   Social Connection and Isolation Panel    Frequency of Communication with Friends and Family: More than three times a week    Frequency of Social Gatherings with  Friends and Family: Twice a week    Attends Religious Services: Never    Database administrator or Organizations: No    Attends Banker Meetings: Never    Marital Status: Widowed    Tobacco Counseling Counseling given: Not Answered    Clinical Intake:  Pre-visit preparation completed: Yes  Pain : No/denies pain Pain Score: 0-No pain     BMI - recorded: 17 Nutritional Status: BMI <19  Underweight Nutritional Risks: None Diabetes: No  Lab Results  Component Value Date   HGBA1C 5.1 09/28/2016     How often do you need to have someone help you when you read instructions, pamphlets, or other written materials from your doctor or pharmacy?: 1 - Never  Interpreter Needed?: No  Information  entered by :: JHONNIE DAS, LPN   Activities of Daily Living     11/16/2023    2:06 PM 11/15/2023    7:22 PM  In your present state of health, do you have any difficulty performing the following activities:  Hearing? 1 1  Vision? 0 0  Difficulty concentrating or making decisions? 1 0  Walking or climbing stairs? 0 0  Comment HAS CHAIR LIFT   Dressing or bathing? 0 0  Doing errands, shopping? 1 1  Preparing Food and eating ? N N  Using the Toilet? N N  In the past six months, have you accidently leaked urine? Y Y  Do you have problems with loss of bowel control? N N  Managing your Medications? Y Y  Managing your Finances? Y Y  Housekeeping or managing your Housekeeping? CINDERELLA CINDERELLA    Patient Care Team: Myrla Jon HERO, MD as PCP - General (Family Medicine) Avanell Katz, MD as Referring Physician (Physical Medicine and Rehabilitation) Dennise Capri, MD (Nephrology) Mittie Gaskin, MD as Referring Physician (Ophthalmology) Meeler, Benton CROME, FNP (Family Medicine)  I have updated your Care Teams any recent Medical Services you may have received from other providers in the past year.     Assessment:   This is a routine wellness examination for  Becky Prince.  Hearing/Vision screen Hearing Screening - Comments:: WEARS AIDS BOTH EARS Vision Screening - Comments:: WEARS GLASSES ALL DAY-    Goals Addressed             This Visit's Progress    DIET - EAT MORE FRUITS AND VEGETABLES         Depression Screen     11/16/2023    2:02 PM 05/21/2023   12:00 PM 02/04/2023    2:48 PM 11/13/2022   10:37 AM 04/13/2022   11:30 AM 03/31/2022    9:46 AM 09/29/2021    9:40 AM  PHQ 2/9 Scores  PHQ - 2 Score 0 0 0 0 6 0 1  PHQ- 9 Score 0 2 2 1 14 1 3    Fall Risk     11/16/2023    2:05 PM 11/15/2023    7:22 PM 02/04/2023    2:48 PM 11/13/2022   10:37 AM 04/13/2022   11:30 AM  Fall Risk   Falls in the past year? 1 1 1  0 1  Number falls in past yr: 1 1 0 0 1  Injury with Fall? 0 0 1 0 1  Risk for fall due to : History of fall(s)   No Fall Risks Impaired balance/gait;Impaired mobility  Follow up Falls evaluation completed;Falls prevention discussed   Falls evaluation completed Falls evaluation completed      Data saved with a previous flowsheet row definition    MEDICARE RISK AT HOME:  Medicare Risk at Home Any stairs in or around the home?: Yes If so, are there any without handrails?: No Home free of loose throw rugs in walkways, pet beds, electrical cords, etc?: Yes Adequate lighting in your home to reduce risk of falls?: Yes Life alert?: Yes Use of a cane, walker or w/c?: Yes (USES WALKER ALL THE TIME) Grab bars in the bathroom?: Yes Shower chair or bench in shower?: Yes Elevated toilet seat or a handicapped toilet?: Yes  TIMED UP AND GO:  Was the test performed?  No  Cognitive Function: 6CIT completed        11/16/2023    2:07 PM  6CIT Screen  What Year? 0 points  What month? 0 points  What time? 0 points  Count back from 20 4 points  Months in reverse 4 points  Repeat phrase 10 points  Total Score 18 points    Immunizations Immunization History  Administered Date(s) Administered   Fluad Quad(high Dose 65+)  02/25/2021, 02/19/2022   Fluad Trivalent(High Dose 65+) 05/21/2023   Influenza, High Dose Seasonal PF 02/09/2015, 03/06/2016, 01/18/2017, 03/17/2018, 12/27/2018, 02/19/2020   PFIZER Comirnaty(Gray Top)Covid-19 Tri-Sucrose Vaccine 05/16/2019, 06/07/2019, 09/26/2020   PFIZER(Purple Top)SARS-COV-2 Vaccination 05/16/2019, 06/07/2019, 01/26/2020   PPD Test 10/06/2019   Pneumococcal Conjugate-13 08/29/2014   Pneumococcal Polysaccharide-23 03/06/2016   Respiratory Syncytial Virus Vaccine,Recomb Aduvanted(Arexvy) 02/19/2022   Td 03/26/2003, 01/10/2011   Tdap 01/10/2011   Unspecified SARS-COV-2 Vaccination 02/19/2022   Zoster, Live 10/14/2006    Screening Tests Health Maintenance  Topic Date Due   Zoster Vaccines- Shingrix (1 of 2) 12/17/1944   DTaP/Tdap/Td (4 - Td or Tdap) 01/09/2021   COVID-19 Vaccine (8 - 2024-25 season) 12/27/2022   INFLUENZA VACCINE  11/26/2023   Medicare Annual Wellness (AWV)  11/15/2024   Pneumococcal Vaccine: 50+ Years  Completed   DEXA SCAN  Completed   Hepatitis B Vaccines  Aged Out   HPV VACCINES  Aged Out   Meningococcal B Vaccine  Aged Out    Health Maintenance  Health Maintenance Due  Topic Date Due   Zoster Vaccines- Shingrix (1 of 2) 12/17/1944   DTaP/Tdap/Td (4 - Td or Tdap) 01/09/2021   COVID-19 Vaccine (8 - 2024-25 season) 12/27/2022   Health Maintenance Items Addressed: UP TO DATE ON SHOTS EXCEPT TDAP; AGED OUT OF MAMMOGRAM, COLONOSCOPY & BDS  Additional Screening:  Vision Screening: Recommended annual ophthalmology exams for early detection of glaucoma and other disorders of the eye. Would you like a referral to an eye doctor? No    Dental Screening: Recommended annual dental exams for proper oral hygiene  Community Resource Referral / Chronic Care Management: CRR required this visit?  No   CCM required this visit?  No   Plan:    I have personally reviewed and noted the following in the patient's chart:   Medical and social  history Use of alcohol, tobacco or illicit drugs  Current medications and supplements including opioid prescriptions. Patient is currently taking opioid prescriptions. Information provided to patient regarding non-opioid alternatives. Patient advised to discuss non-opioid treatment plan with their provider. Functional ability and status Nutritional status Physical activity Advanced directives List of other physicians Hospitalizations, surgeries, and ER visits in previous 12 months Vitals Screenings to include cognitive, depression, and falls Referrals and appointments  In addition, I have reviewed and discussed with patient certain preventive protocols, quality metrics, and best practice recommendations. A written personalized care plan for preventive services as well as general preventive health recommendations were provided to patient.   Jhonnie GORMAN Das, LPN   2/77/7974   After Visit Summary: (MyChart) Due to this being a telephonic visit, the after visit summary with patients personalized plan was offered to patient via MyChart   Notes: Nothing significant to report at this time.

## 2023-11-18 ENCOUNTER — Encounter: Payer: Self-pay | Admitting: Family Medicine

## 2023-12-07 ENCOUNTER — Encounter: Payer: Self-pay | Admitting: Family Medicine

## 2023-12-07 ENCOUNTER — Ambulatory Visit: Admitting: Family Medicine

## 2023-12-07 VITALS — BP 157/63 | HR 62 | Temp 97.9°F | Ht 68.0 in | Wt 110.7 lb

## 2023-12-07 DIAGNOSIS — R634 Abnormal weight loss: Secondary | ICD-10-CM | POA: Diagnosis not present

## 2023-12-07 DIAGNOSIS — R636 Underweight: Secondary | ICD-10-CM

## 2023-12-07 DIAGNOSIS — F331 Major depressive disorder, recurrent, moderate: Secondary | ICD-10-CM

## 2023-12-07 DIAGNOSIS — Z0000 Encounter for general adult medical examination without abnormal findings: Secondary | ICD-10-CM | POA: Diagnosis not present

## 2023-12-07 DIAGNOSIS — I10 Essential (primary) hypertension: Secondary | ICD-10-CM | POA: Diagnosis not present

## 2023-12-07 DIAGNOSIS — F0150 Vascular dementia without behavioral disturbance: Secondary | ICD-10-CM

## 2023-12-07 DIAGNOSIS — F0280 Dementia in other diseases classified elsewhere without behavioral disturbance: Secondary | ICD-10-CM

## 2023-12-07 DIAGNOSIS — E440 Moderate protein-calorie malnutrition: Secondary | ICD-10-CM

## 2023-12-07 DIAGNOSIS — N1832 Chronic kidney disease, stage 3b: Secondary | ICD-10-CM | POA: Diagnosis not present

## 2023-12-07 DIAGNOSIS — G309 Alzheimer's disease, unspecified: Secondary | ICD-10-CM

## 2023-12-07 DIAGNOSIS — E7800 Pure hypercholesterolemia, unspecified: Secondary | ICD-10-CM

## 2023-12-07 DIAGNOSIS — R739 Hyperglycemia, unspecified: Secondary | ICD-10-CM

## 2023-12-07 DIAGNOSIS — R627 Adult failure to thrive: Secondary | ICD-10-CM

## 2023-12-07 DIAGNOSIS — Z0001 Encounter for general adult medical examination with abnormal findings: Secondary | ICD-10-CM

## 2023-12-07 NOTE — Assessment & Plan Note (Signed)
Chronic and well-controlled History of CVA, which is why she is still on secondary prevention Continue statin Recheck FLP annually Goal LDL less than 100

## 2023-12-07 NOTE — Assessment & Plan Note (Signed)
 Chronic underweight status with a BMI of 16, below the desired BMI of at least 19. Recent weight trend shows a decrease from 118 lbs to 110 lbs. No significant changes in appetite reported, but potential concern for decreased meal frequency due to memory issues. Currently on Remeron , which may aid in appetite stimulation. - Recommend drinking Ensure or Boost twice a day to increase caloric intake. - Schedule follow-up in 3 months to monitor weight progress.

## 2023-12-07 NOTE — Patient Instructions (Signed)
 Drink boost or ensure twice daily

## 2023-12-07 NOTE — Assessment & Plan Note (Signed)
 Chronic and stable Recheck metabolic panel Avoid nephrotoxic meds

## 2023-12-07 NOTE — Assessment & Plan Note (Signed)
 Chronic and stable No changes

## 2023-12-07 NOTE — Progress Notes (Signed)
 Complete physical exam   Patient: Becky Prince   DOB: [birth year]   89 y.o. Female  MRN: [id number] Visit Date: 12/07/2023  Today's healthcare provider: Jon Eva, MD   Chief Complaint  Patient presents with   Annual Exam    Diet- General Exercise- Not much, walking some Feeling- Doing okay Sleep- Good Concerns- She said she don't think so.     Subjective    Becky Prince is a 89 y.o. female who presents today for a complete physical exam.   Discussed the use of AI scribe software for clinical note transcription with the patient, who gave verbal consent to proceed.  History of Present Illness   Becky Prince is a 89 year old female who presents for an annual physical exam.  She experiences variable days with some being free of aches and pains, while others involve routine discomforts. She is content with her ability to move around independently.  Her weight has decreased to 110 pounds from 115 pounds at her last visit and 118 pounds prior to that. She has a good appetite and is open to incorporating protein drinks like Ensure or Boost into her diet. She takes Remeron  to help with appetite and Celexa  for mood stabilization.  She acknowledges occasional low moods but no significant feelings of depression.  She uses a hearing aid, which was not properly inserted during the visit, affecting her ability to hear clearly.  She recalls a previous adverse reaction to Augmentin , which caused significant stomach upset.        Last depression screening scores    12/07/2023    2:11 PM 11/16/2023    2:02 PM 05/21/2023   12:00 PM  PHQ 2/9 Scores  PHQ - 2 Score 0 0 0  PHQ- 9 Score 0 0 2   Last fall risk screening    12/07/2023    2:11 PM  Fall Risk   Falls in the past year? 0  Number falls in past yr: 0  Injury with Fall? 0  Risk for fall due to : No Fall Risks        Medications: Outpatient Medications Prior to Visit  Medication Sig   atorvastatin   (LIPITOR) 40 MG tablet TAKE 1 TABLET BY MOUTH EVERY DAY   citalopram  (CELEXA ) 20 MG tablet TAKE 1 TABLET BY MOUTH EVERY DAY   donepezil  (ARICEPT ) 10 MG tablet TAKE 1 TABLET BY MOUTH EVERYDAY AT BEDTIME   fluticasone  (FLONASE ) 50 MCG/ACT nasal spray Place 2 sprays into both nostrils daily.   HYDROcodone-acetaminophen  (NORCO/VICODIN) 5-325 MG tablet Take 1 tablet by mouth every 6 (six) hours as needed for moderate pain (pain score 4-6).   mirtazapine  (REMERON ) 15 MG tablet TAKE 1 TABLET BY MOUTH EVERYDAY AT BEDTIME   spironolactone  (ALDACTONE ) 25 MG tablet Take 1 tablet (25 mg total) by mouth daily.   traMADol  (ULTRAM ) 50 MG tablet Take 50 mg by mouth every 6 (six) hours as needed.   [DISCONTINUED] azithromycin  (ZITHROMAX ) 250 MG tablet Take 500mg  PO daily x1d and then 250mg  daily x4 days (Patient not taking: Reported on 11/16/2023)   [DISCONTINUED] benzonatate  (TESSALON ) 100 MG capsule Take 1 capsule (100 mg total) by mouth 2 (two) times daily as needed for cough. (Patient not taking: Reported on 11/16/2023)   No facility-administered medications prior to visit.    Review of Systems    Objective    BP (!) 157/63 (BP Location: Left Arm, Cuff Size: Small)   Pulse 62  Temp 97.9 F (36.6 C) (Oral)   Ht 5' 8 (1.727 m)   Wt 110 lb 11.2 oz (50.2 kg)   SpO2 97%   BMI 16.83 kg/m    Physical Exam Vitals reviewed.  Constitutional:      General: She is not in acute distress.    Appearance: Normal appearance. She is well-developed. She is not diaphoretic.  HENT:     Head: Normocephalic and atraumatic.  Eyes:     General: No scleral icterus.    Conjunctiva/sclera: Conjunctivae normal.  Neck:     Thyroid : No thyromegaly.  Cardiovascular:     Rate and Rhythm: Normal rate and regular rhythm.     Heart sounds: Normal heart sounds.  Pulmonary:     Effort: Pulmonary effort is normal. No respiratory distress.     Breath sounds: Normal breath sounds. No wheezing, rhonchi or rales.   Musculoskeletal:     Cervical back: Neck supple.     Right lower leg: No edema.     Left lower leg: No edema.  Lymphadenopathy:     Cervical: No cervical adenopathy.  Skin:    General: Skin is warm and dry.     Findings: No rash.  Neurological:     Mental Status: She is alert and oriented to person, place, and time. Mental status is at baseline.  Psychiatric:        Mood and Affect: Mood normal.        Behavior: Behavior normal.      No results found for any visits on 12/07/23.  Assessment & Plan    Routine Health Maintenance and Physical Exam  Exercise Activities and Dietary recommendations  Goals      DIET - EAT MORE FRUITS AND VEGETABLES     Increase water intake     Recommend increasing water intake to 6-8 8 oz glasses a day.         Immunization History  Administered Date(s) Administered   Fluad Quad(high Dose 65+) 02/25/2021, 02/19/2022   Fluad Trivalent(High Dose 65+) 05/21/2023   Influenza, High Dose Seasonal PF 02/09/2015, 03/06/2016, 01/18/2017, 03/17/2018, 12/27/2018, 02/19/2020   PFIZER Comirnaty(Gray Top)Covid-19 Tri-Sucrose Vaccine 05/16/2019, 06/07/2019, 09/26/2020   PFIZER(Purple Top)SARS-COV-2 Vaccination 05/16/2019, 06/07/2019, 01/26/2020   PPD Test 10/06/2019   Pneumococcal Conjugate-13 08/29/2014   Pneumococcal Polysaccharide-23 03/06/2016   Respiratory Syncytial Virus Vaccine,Recomb Aduvanted(Arexvy) 02/19/2022   Td 03/26/2003, 01/10/2011   Tdap 01/10/2011   Unspecified SARS-COV-2 Vaccination 02/19/2022   Zoster, Live 10/14/2006    Health Maintenance  Topic Date Due   Zoster Vaccines- Shingrix (1 of 2) 12/17/1944   DTaP/Tdap/Td (4 - Td or Tdap) 01/09/2021   COVID-19 Vaccine (8 - 2024-25 season) 12/27/2022   INFLUENZA VACCINE  07/25/2024 (Originally 11/26/2023)   Medicare Annual Wellness (AWV)  11/15/2024   Pneumococcal Vaccine: 50+ Years  Completed   DEXA SCAN  Completed   Hepatitis B Vaccines  Aged Out   HPV VACCINES  Aged Out    Meningococcal B Vaccine  Aged Out    Discussed health benefits of physical activity, and encouraged her to engage in regular exercise appropriate for her age and condition.  Problem List Items Addressed This Visit       Cardiovascular and Mediastinum   BP (high blood pressure)   Blood pressure was slightly elevated at the beginning of the visit. - Recheck blood pressure before the end of the visit. - probably fine relatively at age 29 - no med changes  Relevant Orders   Comprehensive metabolic panel with GFR   Mixed dementia (HCC)   Chronic and stable No changes        Genitourinary   Stage 3b chronic kidney disease (HCC)   Chronic and stable Recheck metabolic panel Avoid nephrotoxic meds       Relevant Orders   Comprehensive metabolic panel with GFR     Other   Hypercholesteremia   Chronic and well-controlled History of CVA, which is why she is still on secondary prevention Continue statin Recheck FLP annually Goal LDL less than 100      Relevant Orders   Comprehensive metabolic panel with GFR   Lipid panel   Underweight   Moderate protein-calorie malnutrition (HCC)   Chronic underweight status with a BMI of 16, below the desired BMI of at least 19. Recent weight trend shows a decrease from 118 lbs to 110 lbs. No significant changes in appetite reported, but potential concern for decreased meal frequency due to memory issues. Currently on Remeron , which may aid in appetite stimulation. - Recommend drinking Ensure or Boost twice a day to increase caloric intake. - Schedule follow-up in 3 months to monitor weight progress.      Moderate episode of recurrent major depressive disorder (HCC)   Chronic and well controlled Continue celexa       Other Visit Diagnoses       Encounter for annual physical exam    -  Primary   Relevant Orders   Comprehensive metabolic panel with GFR   Lipid panel   Hemoglobin A1c     Hyperglycemia       Relevant Orders    Hemoglobin A1c     Weight loss         Failure to thrive in adult              General Health Maintenance Upcoming flu season noted. Pneumonia vaccination is up to date. Shingles vaccination declined. Labs for cholesterol, kidney function, and liver function are due. - Recommend flu vaccination in the fall. - Order labs for cholesterol, kidney function, and liver function.        Return in about 3 months (around 03/08/2024) for weight f/u.     Jon Eva, MD  [facility] [phone] (phone) [phone] (fax)  [facility]

## 2023-12-07 NOTE — Assessment & Plan Note (Signed)
 Chronic and well controlled Continue celexa 

## 2023-12-07 NOTE — Assessment & Plan Note (Signed)
 Blood pressure was slightly elevated at the beginning of the visit. - Recheck blood pressure before the end of the visit. - probably fine relatively at age 89 - no med changes

## 2023-12-08 ENCOUNTER — Ambulatory Visit: Payer: Self-pay | Admitting: Family Medicine

## 2023-12-08 LAB — COMPREHENSIVE METABOLIC PANEL WITH GFR
ALT: 12 IU/L (ref 0–32)
AST: 20 IU/L (ref 0–40)
Albumin: 4.2 g/dL (ref 3.6–4.6)
Alkaline Phosphatase: 113 IU/L (ref 44–121)
BUN/Creatinine Ratio: 19 (ref 12–28)
BUN: 32 mg/dL (ref 10–36)
Bilirubin Total: 0.4 mg/dL (ref 0.0–1.2)
CO2: 22 mmol/L (ref 20–29)
Calcium: 9.6 mg/dL (ref 8.7–10.3)
Chloride: 100 mmol/L (ref 96–106)
Creatinine, Ser: 1.65 mg/dL — ABNORMAL HIGH (ref 0.57–1.00)
Globulin, Total: 2.8 g/dL (ref 1.5–4.5)
Glucose: 83 mg/dL (ref 70–99)
Potassium: 5 mmol/L (ref 3.5–5.2)
Sodium: 139 mmol/L (ref 134–144)
Total Protein: 7 g/dL (ref 6.0–8.5)
eGFR: 28 mL/min/1.73 — ABNORMAL LOW (ref >59)

## 2023-12-08 LAB — LIPID PANEL
Chol/HDL Ratio: 2.2 ratio (ref 0.0–4.4)
Cholesterol, Total: 138 mg/dL (ref 100–199)
HDL: 64 mg/dL (ref >39)
LDL Chol Calc (NIH): 61 mg/dL (ref 0–99)
Triglycerides: 62 mg/dL (ref 0–149)
VLDL Cholesterol Cal: 13 mg/dL (ref 5–40)

## 2023-12-08 LAB — HEMOGLOBIN A1C
Est. average glucose Bld gHb Est-mCnc: 114 mg/dL
Hgb A1c MFr Bld: 5.6 % (ref 4.8–5.6)

## 2023-12-09 ENCOUNTER — Other Ambulatory Visit: Payer: Self-pay | Admitting: Family Medicine

## 2023-12-20 DIAGNOSIS — Z79899 Other long term (current) drug therapy: Secondary | ICD-10-CM | POA: Diagnosis not present

## 2023-12-20 DIAGNOSIS — M5416 Radiculopathy, lumbar region: Secondary | ICD-10-CM | POA: Diagnosis not present

## 2023-12-20 DIAGNOSIS — M48062 Spinal stenosis, lumbar region with neurogenic claudication: Secondary | ICD-10-CM | POA: Diagnosis not present

## 2024-01-01 ENCOUNTER — Other Ambulatory Visit: Payer: Self-pay | Admitting: Family Medicine

## 2024-01-04 DIAGNOSIS — M48062 Spinal stenosis, lumbar region with neurogenic claudication: Secondary | ICD-10-CM | POA: Diagnosis not present

## 2024-01-04 DIAGNOSIS — M5416 Radiculopathy, lumbar region: Secondary | ICD-10-CM | POA: Diagnosis not present

## 2024-01-26 ENCOUNTER — Other Ambulatory Visit: Payer: Self-pay | Admitting: Family Medicine

## 2024-01-27 NOTE — Telephone Encounter (Signed)
 Requested Prescriptions  Pending Prescriptions Disp Refills   donepezil  (ARICEPT ) 10 MG tablet [Pharmacy Med Name: DONEPEZIL  HCL 10 MG TABLET] 90 tablet 0    Sig: TAKE 1 TABLET BY MOUTH EVERYDAY AT BEDTIME     Neurology:  Alzheimer's Agents Passed - 01/27/2024  2:17 PM      Passed - Valid encounter within last 6 months    Recent Outpatient Visits           1 month ago Encounter for annual physical exam   [place] [facility] [place], Jon HERO, MD   3 months ago Diarrhea, unspecified type   [facility] [place], [place], PA-C   3 months ago Community acquired pneumonia, unspecified laterality   [facility] [place], Jon HERO, MD   4 months ago Other fatigue   [place] [facility] [place], Jon HERO, MD

## 2024-01-28 ENCOUNTER — Other Ambulatory Visit: Payer: Self-pay | Admitting: Family Medicine

## 2024-01-28 NOTE — Telephone Encounter (Signed)
 Requested medications are due for refill today.  unsure  Requested medications are on the active medications list.  As historical  Last refill. 02/08/2023  Future visit scheduled.   yes  Notes to clinic.  Medication is historical. Refill not delegated    Requested Prescriptions  Pending Prescriptions Disp Refills   HYDROcodone-acetaminophen  (NORCO/VICODIN) 5-325 MG tablet 30 tablet 0    Sig: Take 1 tablet by mouth every 6 (six) hours as needed for moderate pain (pain score 4-6).     Not Delegated - Analgesics:  Opioid Agonist Combinations Failed - 01/28/2024  4:43 PM      Failed - This refill cannot be delegated      Failed - Urine Drug Screen completed in last 360 days      Passed - Valid encounter within last 3 months    Recent Outpatient Visits           1 month ago Encounter for annual physical exam   [facility], Jon HERO, MD   3 months ago Diarrhea, unspecified type   [facility] [place], [place], PA-C   3 months ago Community acquired pneumonia, unspecified laterality   [facility] [place], Jon HERO, MD   4 months ago Other fatigue   [facility], Jon HERO, MD

## 2024-01-28 NOTE — Telephone Encounter (Signed)
 Copied from CRM [id number]. Topic: Clinical - Medication Refill >> Jan 28, 2024  8:48 AM Ahlexyia S wrote: Medication: HYDROcodone-acetaminophen  (NORCO/VICODIN) 5-325 MG tablet  Has the patient contacted their pharmacy? Yes, daughter Verneita was told that the prescription has expired. (Agent: If no, request that the patient contact the pharmacy for the refill. If patient does not wish to contact the pharmacy document the reason why and proceed with request.) (Agent: If yes, when and what did the pharmacy advise?)  This is the patient's preferred pharmacy:  [id number] - [place], [place] - [address] [place] [address] Phone: [phone] Fax: [phone]  Is this the correct pharmacy for this prescription? Yes If no, delete pharmacy and type the correct one.   Has the prescription been filled recently? No  Is the patient out of the medication? Yes  Has the patient been seen for an appointment in the last year OR does the patient have an upcoming appointment? Yes  Can we respond through MyChart? No  Agent: Please be advised that Rx refills may take up to 3 business days. We ask that you follow-up with your pharmacy.

## 2024-02-18 ENCOUNTER — Other Ambulatory Visit: Payer: Self-pay | Admitting: Family Medicine

## 2024-02-19 NOTE — Telephone Encounter (Signed)
 Requested Prescriptions  Pending Prescriptions Disp Refills   citalopram  (CELEXA ) 20 MG tablet [Pharmacy Med Name: CITALOPRAM  HBR 20 MG TABLET] 90 tablet 0    Sig: TAKE 1 TABLET BY MOUTH EVERY DAY     Psychiatry:  Antidepressants - SSRI Passed - 02/19/2024  9:34 AM      Passed - Completed PHQ-2 or PHQ-9 in the last 360 days      Passed - Valid encounter within last 6 months    Recent Outpatient Visits           2 months ago Encounter for annual physical exam   [facility] [place], Jon HERO, MD   3 months ago Diarrhea, unspecified type   [facility] [place], [place], PA-C   3 months ago Community acquired pneumonia, unspecified laterality   [facility] [place], Jon HERO, MD   5 months ago Other fatigue   [facility], Jon HERO, MD

## 2024-03-09 ENCOUNTER — Ambulatory Visit (INDEPENDENT_AMBULATORY_CARE_PROVIDER_SITE_OTHER): Admitting: Family Medicine

## 2024-03-09 ENCOUNTER — Encounter: Payer: Self-pay | Admitting: Family Medicine

## 2024-03-09 VITALS — BP 132/68 | HR 74 | Ht 68.0 in | Wt 114.4 lb

## 2024-03-09 DIAGNOSIS — E440 Moderate protein-calorie malnutrition: Secondary | ICD-10-CM

## 2024-03-09 DIAGNOSIS — K5900 Constipation, unspecified: Secondary | ICD-10-CM

## 2024-03-09 DIAGNOSIS — G894 Chronic pain syndrome: Secondary | ICD-10-CM | POA: Diagnosis not present

## 2024-03-09 DIAGNOSIS — R636 Underweight: Secondary | ICD-10-CM

## 2024-03-09 NOTE — Progress Notes (Signed)
 Established patient visit   Patient: Becky Prince   DOB: Jan 13, 1926   89 y.o. Female  MRN: [id number] Visit Date: 03/09/2024  Today's healthcare provider: Jon Eva, MD   Chief Complaint  Patient presents with   Follow-up   Weight Check   Subjective    HPI   Discussed the use of AI scribe software for clinical note transcription with the patient, who gave verbal consent to proceed.  History of Present Illness   Becky Prince is a 89 year old female who presents for follow-up on weight gain and appetite management. She is accompanied by her daughter, who is also her primary caregiver.  She has gained 4 pounds since August, with a current weight of 114 pounds and a BMI of 17. Her appetite is generally good, but intake decreases when she is alone. A caregiver assists with meals daily, and her daughter provides reminders to eat. She is on mirtazapine  for appetite and weight gain, with an excess of medication due to pharmacy refills.  Constipation has been present, particularly in the last few days, likely due to pain medications. She uses MiraLAX, taking half a capful as needed, as a full capful causes discomfort. Constipation is characterized by passing small, hard stools and has persisted since starting pain medications.  Pain management includes hydrocodone and tramadol  in the morning and at night, with Tylenol  for occasional daytime pain. She has reduced medication use to avoid excessive sleepiness and continues to receive injections for pain management. Morton's neuroma causes discomfort primarily at night, and she has an upcoming appointment with a new podiatrist for evaluation.         Medications: Outpatient Medications Prior to Visit  Medication Sig   atorvastatin  (LIPITOR) 40 MG tablet TAKE 1 TABLET BY MOUTH EVERY DAY   citalopram  (CELEXA ) 20 MG tablet TAKE 1 TABLET BY MOUTH EVERY DAY   donepezil  (ARICEPT ) 10 MG tablet TAKE 1 TABLET BY MOUTH EVERYDAY AT  BEDTIME   fluticasone  (FLONASE ) 50 MCG/ACT nasal spray Place 2 sprays into both nostrils daily.   HYDROcodone-acetaminophen  (NORCO/VICODIN) 5-325 MG tablet Take 1 tablet by mouth every 6 (six) hours as needed for moderate pain (pain score 4-6).   mirtazapine  (REMERON ) 15 MG tablet TAKE 1 TABLET BY MOUTH EVERYDAY AT BEDTIME   spironolactone  (ALDACTONE ) 25 MG tablet TAKE 1 TABLET (25 MG TOTAL) BY MOUTH DAILY.   traMADol  (ULTRAM ) 50 MG tablet Take 50 mg by mouth every 6 (six) hours as needed.   No facility-administered medications prior to visit.    Review of Systems     Objective    BP 132/68 (BP Location: Left Arm, Patient Position: Sitting, Cuff Size: Normal)   Pulse 74   Ht 5' 8 (1.727 m)   Wt 114 lb 6.4 oz (51.9 kg)   SpO2 97%   BMI 17.39 kg/m  BP Readings from Last 3 Encounters:  03/09/24 132/68  12/07/23 (!) 157/63  10/28/23 (!) 159/59   Wt Readings from Last 3 Encounters:  03/09/24 114 lb 6.4 oz (51.9 kg)  12/07/23 110 lb 11.2 oz (50.2 kg)  05/21/23 115 lb (52.2 kg)      Physical Exam Vitals reviewed.  Constitutional:      General: She is not in acute distress.    Appearance: Normal appearance. She is well-developed. She is not diaphoretic.  HENT:     Head: Normocephalic and atraumatic.  Eyes:     General: No scleral icterus.  Conjunctiva/sclera: Conjunctivae normal.  Neck:     Thyroid : No thyromegaly.  Cardiovascular:     Rate and Rhythm: Normal rate and regular rhythm.     Heart sounds: Normal heart sounds.  Pulmonary:     Effort: Pulmonary effort is normal. No respiratory distress.     Breath sounds: Normal breath sounds. No wheezing, rhonchi or rales.  Musculoskeletal:     Cervical back: Neck supple.     Right lower leg: No edema.     Left lower leg: No edema.  Lymphadenopathy:     Cervical: No cervical adenopathy.  Skin:    General: Skin is warm and dry.     Findings: No rash.  Neurological:     Mental Status: She is alert and oriented to  person, place, and time. Mental status is at baseline.  Psychiatric:        Mood and Affect: Mood normal.        Behavior: Behavior normal.      No results found for any visits on 03/09/24.  Assessment & Plan     Problem List Items Addressed This Visit       Other   Constipation   Underweight - Primary   Moderate protein-calorie malnutrition   Other Visit Diagnoses       Chronic pain syndrome               Underweight BMI remains low at 17, but there has been a positive weight gain of 4 pounds since August, now weighing 114 pounds. Appetite is good, and mirtazapine  is aiding in weight gain. The goal is to gain an additional 4 pounds before the next visit. - Continue mirtazapine  to aid appetite and weight gain. - Encouraged regular meals and reminders to eat, especially when alone. - Scheduled follow-up in 3 months to monitor weight.  Constipation Recent constipation likely exacerbated by opioid use. MiraLAX has been used with partial relief. Hard stools indicate need for continued management. Senokot suggested as an alternative due to its effectiveness with narcotics. - Continue MiraLAX, consider splitting dose to half a capful twice daily. - Try Senokot as an alternative to MiraLAX, available over the counter. - Establish a regular bowel regimen to prevent constipation.  Chronic pain management Chronic pain managed with hydrocodone and tramadol , taken twice daily. Pain is well-controlled with occasional use of Tylenol  for breakthrough pain. Pain injections are beneficial. - Continue current pain management regimen with hydrocodone and tramadol  twice daily. - Use Tylenol  for breakthrough pain as needed. - Continue pain injections as scheduled.        Return in about 3 months (around 06/09/2024) for weight f/u.       Jon Eva, MD  [facility] [phone] (phone) [phone] (fax)  [facility]

## 2024-03-21 DIAGNOSIS — M5416 Radiculopathy, lumbar region: Secondary | ICD-10-CM | POA: Diagnosis not present

## 2024-03-21 DIAGNOSIS — M48062 Spinal stenosis, lumbar region with neurogenic claudication: Secondary | ICD-10-CM | POA: Diagnosis not present

## 2024-03-21 DIAGNOSIS — Z79899 Other long term (current) drug therapy: Secondary | ICD-10-CM | POA: Diagnosis not present

## 2024-04-09 ENCOUNTER — Other Ambulatory Visit: Payer: Self-pay | Admitting: Family Medicine

## 2024-04-26 ENCOUNTER — Other Ambulatory Visit: Payer: Self-pay | Admitting: Family Medicine

## 2024-05-05 ENCOUNTER — Telehealth: Payer: Self-pay

## 2024-05-05 NOTE — Telephone Encounter (Signed)
 Please advise

## 2024-05-05 NOTE — Telephone Encounter (Signed)
 Copied from CRM [id number]. Topic: Clinical - Medical Advice >> May 05, 2024  9:46 AM Chasity T wrote: Reason for CRM: Verneita daughter of patient is calling to advise Dr B that her mother is having on and off depression moods, wants to know if dr b can give patient can give her some kind of anti depressant medication to deal with her leaving.   Please call back at : [phone]

## 2024-05-09 ENCOUNTER — Telehealth: Admitting: Physician Assistant

## 2024-05-09 ENCOUNTER — Ambulatory Visit: Payer: Self-pay

## 2024-05-09 DIAGNOSIS — R197 Diarrhea, unspecified: Secondary | ICD-10-CM

## 2024-05-09 DIAGNOSIS — K5909 Other constipation: Secondary | ICD-10-CM

## 2024-05-09 DIAGNOSIS — R159 Full incontinence of feces: Secondary | ICD-10-CM

## 2024-05-09 DIAGNOSIS — K5903 Drug induced constipation: Secondary | ICD-10-CM

## 2024-05-09 NOTE — Telephone Encounter (Signed)
 Pt daughter advised. Verbalized understanding and her mother reports she would like to hold off on appointment at the moment and call back if needed.

## 2024-05-09 NOTE — Telephone Encounter (Signed)
 She is on remeron , which improves appetite but is also an anti-depressant. We could go up on the dose if she is not well controlled and they are amenable to this. Please let me know what family says.

## 2024-05-09 NOTE — Telephone Encounter (Signed)
 FYI Only or Action Required?: FYI only for provider: ED advised.  Patient was last seen in primary care on 03/09/2024 by Myrla Jon HERO, MD.  Called Nurse Triage reporting Diarrhea.  Symptoms began today.  Interventions attempted: Nothing.  Symptoms are: unchanged.  Triage Disposition: See HCP Within 4 Hours (Or PCP Triage)  Patient/caregiver understands and will follow disposition?: No, wishes to speak with PCP  Copied from CRM #[id number]. Topic: Clinical - Red Word Triage >> May 09, 2024 10:40 AM Willma SAUNDERS wrote: Red Word that prompted transfer to Nurse Triage: Patient was schedule for an e-visit this morning for Extreme diarrhea, weakness and feeling completely washed out. Was advised to go to ER but daughter doesn't think its not that severe. Still having symptoms Reason for Disposition  [1] SEVERE diarrhea (e.g., 7 or more times / day more than normal) AND [2] age > 60 years  Answer Assessment - Initial Assessment Questions 1. DIARRHEA SEVERITY: How bad is the diarrhea? How many more stools have you had in the past 24 hours than normal?      More than 7 BM since it started over night 2. ONSET: When did the diarrhea begin?      Early this morning 3. STOOL DESCRIPTION:  How loose or watery is the diarrhea? What is the stool color? Is there any blood or mucous in the stool?     Watery 4. VOMITING: Are you also vomiting? If Yes, ask: How many times in the past 24 hours?      denies 5. ABDOMEN PAIN: Are you having any abdomen pain? If Yes, ask: What does it feel like? (e.g., crampy, dull, intermittent, constant)      States had pain yesterday but at the moment does not have abd pain 6. ABDOMEN PAIN SEVERITY: If present, ask: How bad is the pain?  (e.g., Scale 1-10; mild, moderate, or severe)    0 currently, states that yesterday it was severe 7. ORAL INTAKE: If vomiting, Have you been able to drink liquids? How much liquids have you had in the past 24  hours?     Daughter is pushing fluid intake 9. EXPOSURE: Have you traveled to a foreign country recently? Have you been exposed to anyone with diarrhea? Could you have eaten any food that was spoiled?     denies 10. ANTIBIOTIC USE: Are you taking antibiotics now or have you taken antibiotics in the past 2 months?       denies 11. OTHER SYMPTOMS: Do you have any other symptoms? (e.g., fever, blood in stool)       denies  Daughter/caller was advised to go to ED, daughter refusing at this time, would like call back at this time. No appts available at home clinic today.  Protocols used: [facility]

## 2024-05-09 NOTE — Telephone Encounter (Signed)
 Definitely hold the Senna as it may be contributing to the diarrhea. Hold any other constipation med if she is taking any also.  We can do a virtual visit if she'd like.

## 2024-05-09 NOTE — Telephone Encounter (Signed)
 Noted

## 2024-05-09 NOTE — Progress Notes (Signed)
 Based on what you shared with me, I feel your condition warrants further evaluation as soon as possible at an Emergency department. I am highly concerned for a possible bowel blockage causing the symptoms you are experiencing.   NOTE: There will be NO CHARGE for this eVisit   If you are having a true medical emergency please call 911.      Emergency Department-[facility]  Get Driving Directions  [phone]  [address]  [place], [place] [address]  Open 24/7/365      [facility] Emergency Department at [facility]  Get Driving Directions  [address]  [place], [place] [address]  Open 24/7/365    Emergency Department- [facility]  Get Driving Directions  [phone]  [address]  [place], [place] [address]  Open 24/7/365      [facility] at [facility]  Get Driving Directions  [phone]  [address]  [place], [place] [address]  Open 24/7/365    [facility]  Emergency Department- [facility]  Get Driving Directions  [phone]  [address]  [place], [place] [address]  Open 24/7/365    [place]  Emergency Department- [facility]  Get Driving Directions  [address]  [place], [place] [address]  Open 24/7/365    [facility]  Emergency Department- [facility]  Get Driving Directions  [phone]  [address]  [place], [place] [address]  Open 24/7/365

## 2024-05-13 ENCOUNTER — Other Ambulatory Visit: Payer: Self-pay | Admitting: Family Medicine

## 2024-06-12 ENCOUNTER — Ambulatory Visit: Admitting: Family Medicine

## 2024-11-21 ENCOUNTER — Ambulatory Visit
